# Patient Record
Sex: Female | Born: 1990 | Race: White | Hispanic: No | Marital: Single | Smoking: Never smoker
Health system: Southern US, Community
[De-identification: ages and names within clinical notes are randomized; demographics above are authoritative.]

## PROBLEM LIST (undated history)

## (undated) DIAGNOSIS — F32A Depression, unspecified: Secondary | ICD-10-CM

## (undated) DIAGNOSIS — K769 Liver disease, unspecified: Secondary | ICD-10-CM

## (undated) DIAGNOSIS — F329 Major depressive disorder, single episode, unspecified: Secondary | ICD-10-CM

## (undated) DIAGNOSIS — F419 Anxiety disorder, unspecified: Secondary | ICD-10-CM

## (undated) DIAGNOSIS — J039 Acute tonsillitis, unspecified: Secondary | ICD-10-CM

## (undated) DIAGNOSIS — N179 Acute kidney failure, unspecified: Secondary | ICD-10-CM

## (undated) DIAGNOSIS — A419 Sepsis, unspecified organism: Secondary | ICD-10-CM

## (undated) DIAGNOSIS — F319 Bipolar disorder, unspecified: Secondary | ICD-10-CM

## (undated) DIAGNOSIS — M797 Fibromyalgia: Secondary | ICD-10-CM

## (undated) HISTORY — PX: TONSILLECTOMY: SUR1361

## (undated) HISTORY — PX: WISDOM TOOTH EXTRACTION: SHX21

## (undated) HISTORY — DX: Acute tonsillitis, unspecified: J03.90

## (undated) HISTORY — DX: Sepsis, unspecified organism: A41.9

## (undated) HISTORY — DX: Acute kidney failure, unspecified: N17.9

## (undated) HISTORY — DX: Liver disease, unspecified: K76.9

## (undated) HISTORY — DX: Fibromyalgia: M79.7

---

## 2010-07-21 ENCOUNTER — Emergency Department (HOSPITAL_COMMUNITY)
Admission: EM | Admit: 2010-07-21 | Discharge: 2010-07-21 | Disposition: A | Discharge status: Home / Self Care | Payer: Medicaid Other | Attending: Emergency Medicine | Admitting: Emergency Medicine

## 2010-07-21 DIAGNOSIS — J029 Acute pharyngitis, unspecified: Secondary | ICD-10-CM | POA: Insufficient documentation

## 2010-07-21 DIAGNOSIS — R6883 Chills (without fever): Secondary | ICD-10-CM | POA: Insufficient documentation

## 2010-07-21 DIAGNOSIS — R599 Enlarged lymph nodes, unspecified: Secondary | ICD-10-CM | POA: Insufficient documentation

## 2010-07-21 DIAGNOSIS — R51 Headache: Secondary | ICD-10-CM | POA: Insufficient documentation

## 2010-12-20 ENCOUNTER — Emergency Department (HOSPITAL_COMMUNITY)
Admission: EM | Admit: 2010-12-20 | Discharge: 2010-12-20 | Disposition: A | Discharge status: Home / Self Care | Payer: Self-pay | Attending: Emergency Medicine | Admitting: Emergency Medicine

## 2010-12-20 ENCOUNTER — Encounter: Payer: Self-pay | Admitting: *Deleted

## 2010-12-20 DIAGNOSIS — N390 Urinary tract infection, site not specified: Secondary | ICD-10-CM | POA: Insufficient documentation

## 2010-12-20 DIAGNOSIS — R350 Frequency of micturition: Secondary | ICD-10-CM | POA: Insufficient documentation

## 2010-12-20 DIAGNOSIS — R109 Unspecified abdominal pain: Secondary | ICD-10-CM | POA: Insufficient documentation

## 2010-12-20 DIAGNOSIS — M545 Low back pain, unspecified: Secondary | ICD-10-CM | POA: Insufficient documentation

## 2010-12-20 DIAGNOSIS — R3915 Urgency of urination: Secondary | ICD-10-CM | POA: Insufficient documentation

## 2010-12-20 LAB — WET PREP, GENITAL
Clue Cells Wet Prep HPF POC: NONE SEEN
Trich, Wet Prep: NONE SEEN

## 2010-12-20 LAB — URINALYSIS, ROUTINE W REFLEX MICROSCOPIC
Bilirubin Urine: NEGATIVE
Glucose, UA: NEGATIVE mg/dL
Ketones, ur: NEGATIVE mg/dL
Nitrite: NEGATIVE
Specific Gravity, Urine: 1.023 (ref 1.005–1.030)
pH: 7.5 (ref 5.0–8.0)

## 2010-12-20 LAB — URINE MICROSCOPIC-ADD ON

## 2010-12-20 LAB — PREGNANCY, URINE: Preg Test, Ur: NEGATIVE

## 2010-12-20 MED ORDER — CIPROFLOXACIN HCL 500 MG PO TABS: 500.0000 mg | ORAL_TABLET | Freq: Two times a day (BID) | ORAL | Status: AC

## 2010-12-20 NOTE — ED Provider Notes (Signed)
History     CSN: [id number] Arrival date & time: 12/20/2010  2:08 PM   First MD Initiated Contact with Patient 12/20/10 1444   HPI Patient reports right-sided lower back and suprapubic abdominal pain. States she thinks it is a kidney infection. Associated with urinary frequency and urgency. Denies dysuria, fever, nausea, vomiting, diarrhea, constipation. Patient is a 20 y.o. female presenting with frequency. The history is provided by the patient.  Urinary Frequency This is a new problem. The current episode started yesterday. The problem occurs constantly. The problem has been gradually worsening. Associated symptoms include abdominal pain and urinary symptoms. Pertinent negatives include no chills, fever, nausea or vomiting. Associated symptoms comments: Right-sided back pain, vaginal discharge.. Exacerbated by: Urination. She has tried nothing for the symptoms.    History reviewed. No pertinent past medical history.  History reviewed. No pertinent past surgical history.  History reviewed. No pertinent family history.  History  Substance Use Topics  . Smoking status: Not on file  . Smokeless tobacco: Not on file  . Alcohol Use: Not on file    OB History    Grav Para Term Preterm Abortions TAB SAB Ect Mult Living                  Review of Systems  Constitutional: Negative for fever and chills.  Gastrointestinal: Positive for abdominal pain. Negative for nausea, vomiting, diarrhea and constipation.  Genitourinary: Positive for urgency and frequency. Negative for dysuria, hematuria and flank pain.  Musculoskeletal: Positive for back pain.    Allergies  Review of patient's allergies indicates no known allergies.  Home Medications  No current outpatient prescriptions on file.  BP 116/80  Pulse 95  Temp(Src) 98.3 F (36.8 C) (Oral)  Resp 20  SpO2 98%  LMP 12/01/2010  Physical Exam  Vitals reviewed. Constitutional: She is oriented to person, place, and time. Vital  signs are normal. She appears well-developed and well-nourished.  HENT:  Head: Normocephalic and atraumatic.  Eyes: Conjunctivae are normal. Pupils are equal, round, and reactive to light.  Neck: Normal range of motion. Neck supple.  Cardiovascular: Normal rate, regular rhythm and normal heart sounds.  Exam reveals no friction rub.   No murmur heard. Pulmonary/Chest: Effort normal and breath sounds normal. She has no wheezes. She has no rhonchi. She has no rales. She exhibits no tenderness.  Abdominal: Soft. Bowel sounds are normal. She exhibits no distension and no mass. There is no tenderness. There is CVA tenderness. There is no rebound and no guarding.       Right-sided CVA tenderness.  Musculoskeletal: Normal range of motion.  Neurological: She is alert and oriented to person, place, and time. Coordination normal.  Skin: Skin is warm and dry. No rash noted. No erythema. No pallor.    ED Course  Procedures  Results for orders placed during the hospital encounter of 12/20/10  URINALYSIS, ROUTINE W REFLEX MICROSCOPIC      Component Value Range   Color, Urine YELLOW  YELLOW    Appearance CLOUDY (*) CLEAR    Specific Gravity, Urine 1.023  1.005 - 1.030    pH 7.5  5.0 - 8.0    Glucose, UA NEGATIVE  NEGATIVE (mg/dL)   Hgb urine dipstick NEGATIVE  NEGATIVE    Bilirubin Urine NEGATIVE  NEGATIVE    Ketones, ur NEGATIVE  NEGATIVE (mg/dL)   Protein, ur NEGATIVE  NEGATIVE (mg/dL)   Urobilinogen, UA 1.0  0.0 - 1.0 (mg/dL)   Nitrite NEGATIVE  NEGATIVE  Leukocytes, UA MODERATE (*) NEGATIVE   WET PREP, GENITAL      Component Value Range   Yeast, Wet Prep NONE SEEN  NONE SEEN    Trich, Wet Prep NONE SEEN  NONE SEEN    Clue Cells, Wet Prep NONE SEEN  NONE SEEN    WBC, Wet Prep HPF POC MANY (*) NONE SEEN   PREGNANCY, URINE      Component Value Range   Preg Test, Ur NEGATIVE    URINE MICROSCOPIC-ADD ON      Component Value Range   Squamous Epithelial / LPF RARE  RARE    WBC, UA 7-10  <3  (WBC/hpf)   RBC / HPF 0-2  <3 (RBC/hpf)   Bacteria, UA FEW (*) RARE     MDM   We'll treat with Cipro x3 days for urinary tract infection.      Thomasene Lot, [place] 12/20/10 [phone]

## 2010-12-20 NOTE — ED Provider Notes (Signed)
Medical screening examination/treatment/procedure(s) were performed by non-physician practitioner and as supervising physician I was immediately available for consultation/collaboration.   Asenath Balash M Azeez Dunker, DO 12/20/10 2205 

## 2010-12-22 LAB — GC/CHLAMYDIA PROBE AMP, GENITAL: Chlamydia, DNA Probe: NEGATIVE

## 2011-02-21 ENCOUNTER — Encounter (HOSPITAL_COMMUNITY): Payer: Self-pay | Admitting: *Deleted

## 2011-02-21 ENCOUNTER — Emergency Department (HOSPITAL_COMMUNITY)
Admission: EM | Admit: 2011-02-21 | Discharge: 2011-02-21 | Disposition: A | Discharge status: Home / Self Care | Payer: Medicaid Other | Attending: Emergency Medicine | Admitting: Emergency Medicine

## 2011-02-21 DIAGNOSIS — R21 Rash and other nonspecific skin eruption: Secondary | ICD-10-CM | POA: Insufficient documentation

## 2011-02-21 DIAGNOSIS — F411 Generalized anxiety disorder: Secondary | ICD-10-CM | POA: Insufficient documentation

## 2011-02-21 DIAGNOSIS — F419 Anxiety disorder, unspecified: Secondary | ICD-10-CM

## 2011-02-21 DIAGNOSIS — M79609 Pain in unspecified limb: Secondary | ICD-10-CM | POA: Insufficient documentation

## 2011-02-21 MED ORDER — LORAZEPAM 1 MG PO TABS: 1.0000 mg | ORAL_TABLET | Freq: Two times a day (BID) | ORAL | Status: AC | PRN

## 2011-02-21 NOTE — ED Notes (Signed)
Patient with abcess under her arms. Has been there for two years and getting worse

## 2011-02-21 NOTE — ED Provider Notes (Signed)
History     CSN: [id number]  Arrival date & time 02/21/11  1517   First MD Initiated Contact with Patient 02/21/11 1615      Chief Complaint  Patient presents with  . boils      Patient is a 21 y.o. female presenting with rash. The history is provided by the patient.  Rash  This is a chronic problem. The current episode started more than 1 week ago. The problem has not changed since onset.The problem is associated with an unknown factor. There has been no fever. The pain is at a severity of 3/10. The pain is mild. Associated symptoms include pain. She has tried antibiotic cream for the symptoms.  Pt reports 2 yr hx of "abscesses" to her bil axilla. States this started to occur when she was pregnant approx 2 yrs ago abd has persisted. Pt states she get thesesmall red pustules that often develop into painful abscesses. States she has tried abx on numerous occassions w/o improvement. Pt requesting referral to dermatologist.  Pt also states she has a hx of anxiety/panic disorder and is out of her medication. States she has attempted to get in to see her PCP but the assigned PCP on her medicaid card "is not accepting new pt's". Requesting short course of medication for anxiety as she is under a tremendous amount of personal stress and needs something.    History reviewed. No pertinent past medical history.  History reviewed. No pertinent past surgical history.  History reviewed. No pertinent family history.  History  Substance Use Topics  . Smoking status: Never Smoker   . Smokeless tobacco: Not on file  . Alcohol Use: No    OB History    Grav Para Term Preterm Abortions TAB SAB Ect Mult Living                  Review of Systems  Constitutional: Negative.   HENT: Negative.   Eyes: Negative.   Respiratory: Negative.   Cardiovascular: Negative.   Gastrointestinal: Negative.   Genitourinary: Negative.   Musculoskeletal: Negative.   Skin: Positive for rash.  Neurological:  Negative.   Hematological: Negative.   Psychiatric/Behavioral: Negative.     Allergies  Review of patient's allergies indicates no known allergies.  Home Medications   Current Outpatient Rx  Name Route Sig Dispense Refill  . IBUPROFEN 200 MG PO TABS Oral Take 600 mg by mouth daily as needed. For pain/headache      BP 121/87  Pulse 97  Temp(Src) 98.6 F (37 C) (Oral)  Resp 20  SpO2 98%  Physical Exam  Constitutional: She is oriented to person, place, and time. She appears well-developed and well-nourished.  HENT:  Head: Normocephalic and atraumatic.  Eyes: Conjunctivae are normal.  Neck: Neck supple.  Cardiovascular: Normal rate.   Pulmonary/Chest: Effort normal and breath sounds normal.  Musculoskeletal: Normal range of motion.  Neurological: She is alert and oriented to person, place, and time.  Skin: Skin is warm and dry. No erythema.       Scattered small, raised, erythematous, pustules to bil axilla. No palpable nodules, erythema or indurated areas around these pustules. None w/ palpable fluctuance. Pt reports mild TTP.  Resolving abscesses to (L) upper buttocks and (R) lower axilla.   Psychiatric: She has a normal mood and affect.    ED Course  Procedures Findings and clinical impression discussed w/ pt. Will plan to d/c home w/ dermatology/surgery referrals and prescribe short course of medication for anxiety  and strongly encourage pt's continued efforts at getting established w/a primary care provider. Pt is agreeable w/ plan.  Labs Reviewed - No data to display No results found.   No diagnosis found.    MDM   HPI/PE and clinical findings c/w hidradenitis suppurative (No infectious appearing abscesses at this time). Anxiety by self reported hx.       Leanne Chang, NP 02/21/11 1727

## 2011-02-22 NOTE — ED Provider Notes (Signed)
Medical screening examination/treatment/procedure(s) were performed by non-physician practitioner and as supervising physician I was immediately available for consultation/collaboration.   Suzi Roots, MD 02/22/11 1029

## 2011-05-16 ENCOUNTER — Emergency Department (HOSPITAL_COMMUNITY)
Admission: EM | Admit: 2011-05-16 | Discharge: 2011-05-16 | Disposition: A | Discharge status: Home / Self Care | Payer: Self-pay | Source: Home / Self Care | Attending: Family Medicine | Admitting: Family Medicine

## 2011-05-16 ENCOUNTER — Encounter (HOSPITAL_COMMUNITY): Payer: Self-pay | Admitting: Emergency Medicine

## 2011-05-16 DIAGNOSIS — J020 Streptococcal pharyngitis: Secondary | ICD-10-CM

## 2011-05-16 HISTORY — DX: Anxiety disorder, unspecified: F41.9

## 2011-05-16 HISTORY — DX: Depression, unspecified: F32.A

## 2011-05-16 HISTORY — DX: Major depressive disorder, single episode, unspecified: F32.9

## 2011-05-16 MED ORDER — CEFDINIR 300 MG PO CAPS: 300.0000 mg | ORAL_CAPSULE | Freq: Two times a day (BID) | ORAL | Status: AC

## 2011-05-16 NOTE — Discharge Instructions (Signed)
Drink lots of fluids, take all of medicine, use lozenges as needed.return if needed °

## 2011-05-16 NOTE — ED Notes (Signed)
Sore throat and ears pain, onset to days ago.  Son treated for strep throat recently.

## 2011-05-16 NOTE — ED Provider Notes (Signed)
History     CSN: [id number]  Arrival date & time 05/16/11  1309   First MD Initiated Contact with Patient 05/16/11 1311      Chief Complaint  Patient presents with  . Sore Throat    (Consider location/radiation/quality/duration/timing/severity/associated sxs/prior treatment) Patient is a 21 y.o. female presenting with pharyngitis. The history is provided by the patient.  Sore Throat This is a new problem. The current episode started 2 days ago. The problem occurs constantly. The problem has been gradually worsening. Associated symptoms comments: Son with recent strep throat. The symptoms are aggravated by swallowing.    Past Medical History  Diagnosis Date  . Depression   . Anxiety     History reviewed. No pertinent past surgical history.  No family history on file.  History  Substance Use Topics  . Smoking status: Never Smoker   . Smokeless tobacco: Not on file  . Alcohol Use: No    OB History    Grav Para Term Preterm Abortions TAB SAB Ect Mult Living                  Review of Systems  Constitutional: Negative.   HENT: Positive for ear pain and sore throat. Negative for congestion, rhinorrhea, sneezing and postnasal drip.   Respiratory: Negative for cough.   Gastrointestinal: Negative.   Hematological: Positive for adenopathy.    Allergies  Review of patient's allergies indicates no known allergies.  Home Medications   Current Outpatient Rx  Name Route Sig Dispense Refill  . DESVENLAFAXINE SUCCINATE ER 50 MG PO TB24 Oral Take 50 mg by mouth daily.    Marland Kitchen DIAZEPAM 5 MG PO TABS Oral Take 5 mg by mouth every 6 (six) hours as needed.    Marland Kitchen CEFDINIR 300 MG PO CAPS Oral Take 1 capsule (300 mg total) by mouth 2 (two) times daily. 20 capsule 0  . IBUPROFEN 200 MG PO TABS Oral Take 600 mg by mouth daily as needed. For pain/headache      BP 127/76  Pulse 100  Temp(Src) 98.8 F (37.1 C) (Oral)  Resp 18  SpO2 100%  LMP 05/16/2011  Physical Exam  Nursing  note and vitals reviewed. Constitutional: She is oriented to person, place, and time. She appears well-developed and well-nourished.  HENT:  Head: Normocephalic.  Right Ear: External ear normal.  Left Ear: External ear normal.  Mouth/Throat: Posterior oropharyngeal erythema present. No oropharyngeal exudate or posterior oropharyngeal edema.  Eyes: Pupils are equal, round, and reactive to light.  Neck: Normal range of motion. Neck supple.  Cardiovascular: Normal rate, regular rhythm, normal heart sounds and intact distal pulses.   Pulmonary/Chest: Breath sounds normal.  Lymphadenopathy:    She has cervical adenopathy.  Neurological: She is alert and oriented to person, place, and time.  Skin: Skin is warm and dry.    ED Course  Procedures (including critical care time)  Labs Reviewed - No data to display No results found.   1. Strep sore throat       MDM          Linna Hoff, MD 05/16/11 1418

## 2011-12-21 ENCOUNTER — Emergency Department (HOSPITAL_COMMUNITY)
Admission: EM | Admit: 2011-12-21 | Discharge: 2011-12-21 | Disposition: A | Discharge status: Home / Self Care | Payer: Medicaid Other | Attending: Emergency Medicine | Admitting: Emergency Medicine

## 2011-12-21 ENCOUNTER — Encounter (HOSPITAL_COMMUNITY): Payer: Self-pay | Admitting: Emergency Medicine

## 2011-12-21 DIAGNOSIS — F329 Major depressive disorder, single episode, unspecified: Secondary | ICD-10-CM | POA: Insufficient documentation

## 2011-12-21 DIAGNOSIS — L259 Unspecified contact dermatitis, unspecified cause: Secondary | ICD-10-CM

## 2011-12-21 DIAGNOSIS — F319 Bipolar disorder, unspecified: Secondary | ICD-10-CM | POA: Insufficient documentation

## 2011-12-21 DIAGNOSIS — F411 Generalized anxiety disorder: Secondary | ICD-10-CM | POA: Insufficient documentation

## 2011-12-21 DIAGNOSIS — Z79899 Other long term (current) drug therapy: Secondary | ICD-10-CM | POA: Insufficient documentation

## 2011-12-21 DIAGNOSIS — F3289 Other specified depressive episodes: Secondary | ICD-10-CM | POA: Insufficient documentation

## 2011-12-21 HISTORY — DX: Bipolar disorder, unspecified: F31.9

## 2011-12-21 MED ORDER — HYDROXYZINE PAMOATE 100 MG PO CAPS: 100.0000 mg | ORAL_CAPSULE | Freq: Three times a day (TID) | ORAL | Status: DC | PRN

## 2011-12-21 MED ORDER — PREDNISONE 20 MG PO TABS: ORAL_TABLET | ORAL | Status: DC

## 2011-12-21 NOTE — ED Provider Notes (Signed)
History     CSN: [id number]  Arrival date & time 12/21/11  1148   First MD Initiated Contact with Patient 12/21/11 1154      No chief complaint on file.   (Consider location/radiation/quality/duration/timing/severity/associated sxs/prior treatment) HPI Comments: 21 year old female presents the emergency department complaining of a rash on her face for the past 3 days. She states she believes it is coming from her boyfriends detergent. She lays on her boyfriends chest on the left side of her face where the rash began. It is spread slightly to the right side of her face and beginning to go down her back. States it is very itchy. Denies difficulty breathing or swallowing. No chest pain or shortness of breath. Does not recall eating any different foods. She tried applying a cream she has for rashes without relief. Benadryl is not providing much relief.  The history is provided by the patient.    Past Medical History  Diagnosis Date  . Depression   . Anxiety   . Bipolar 1 disorder     No past surgical history on file.  No family history on file.  History  Substance Use Topics  . Smoking status: Never Smoker   . Smokeless tobacco: Not on file  . Alcohol Use: No    OB History    Grav Para Term Preterm Abortions TAB SAB Ect Mult Living                  Review of Systems  Constitutional: Negative for fever and chills.  HENT: Negative for trouble swallowing, neck pain and neck stiffness.   Eyes: Negative.   Respiratory: Negative for chest tightness and shortness of breath.   Musculoskeletal: Negative.   Skin: Positive for rash.    Allergies  Review of patient's allergies indicates no known allergies.  Home Medications   Current Outpatient Rx  Name  Route  Sig  Dispense  Refill  . ALPRAZOLAM 0.5 MG PO TABS   Oral   Take 0.5 mg by mouth 3 (three) times daily as needed. For anxiety         . DIPHENHYDRAMINE HCL 25 MG PO CAPS   Oral   Take 25 mg by mouth every 6  (six) hours as needed. For allergies         . DOXYCYCLINE HYCLATE 100 MG PO TABS   Oral   Take 100 mg by mouth daily.         Marland Kitchen FLUOXETINE HCL 20 MG PO TABS   Oral   Take 20 mg by mouth daily.         . IBUPROFEN 200 MG PO TABS   Oral   Take 600 mg by mouth daily as needed. For pain/headache         . LAMOTRIGINE 100 MG PO TABS   Oral   Take 100 mg by mouth daily.           BP 131/81  Pulse 99  Temp 98.5 F (36.9 C) (Oral)  Resp 18  SpO2 98%  Physical Exam  Constitutional: She is oriented to person, place, and time. She appears well-developed and well-nourished. No distress.  HENT:  Head: Normocephalic and atraumatic.  Mouth/Throat: Oropharynx is clear and moist.  Eyes: Conjunctivae normal and EOM are normal. Pupils are equal, round, and reactive to light.  Neck: Normal range of motion. Neck supple.  Cardiovascular: Normal rate, regular rhythm and normal heart sounds.   Pulmonary/Chest: Effort normal and breath sounds  normal. No respiratory distress.  Musculoskeletal: Normal range of motion.  Lymphadenopathy:    She has no cervical adenopathy.  Neurological: She is alert and oriented to person, place, and time.  Skin: Skin is warm, dry and intact. Rash noted. Rash is maculopapular (on forehead, cheeks, nose, and scattered on upper back. no evidence of secondary infection. ). She is not diaphoretic.  Psychiatric: She has a normal mood and affect. Her behavior is normal.    ED Course  Procedures (including critical care time)  Labs Reviewed - No data to display No results found.   1. Contact dermatitis       MDM  21 y/o female with contact dermatitis. No evidence of secondary infection. Rx prednisone and vistaril. Return precautions discussed.        Trevor Mace, PA-C 12/21/11 1527

## 2011-12-21 NOTE — ED Notes (Signed)
Is on a med for folliculitiis c 8 months  doxy

## 2011-12-21 NOTE — ED Notes (Signed)
3rd day of breaking out in rash > ? Boyfriends detergent  Rash on fash  Back no diff breathing or swollowing pt drover herself here

## 2011-12-23 NOTE — ED Provider Notes (Signed)
Medical screening examination/treatment/procedure(s) were conducted as a shared visit with non-physician practitioner(s) and myself.  I personally evaluated the patient during the encounter.  21yF with diffuse scaly rash consistent with eczema. Hx of same. Plan topical and also oral steroids given extent of involvement. Return precautions discussed.  Raeford Razor, MD 12/23/11 [phone]

## 2012-06-21 ENCOUNTER — Encounter (HOSPITAL_COMMUNITY): Payer: Self-pay | Admitting: *Deleted

## 2012-06-21 ENCOUNTER — Emergency Department (HOSPITAL_COMMUNITY)
Admission: EM | Admit: 2012-06-21 | Discharge: 2012-06-21 | Disposition: A | Discharge status: Home / Self Care | Payer: Medicaid Other | Attending: Emergency Medicine | Admitting: Emergency Medicine

## 2012-06-21 ENCOUNTER — Emergency Department (HOSPITAL_COMMUNITY): Payer: Medicaid Other

## 2012-06-21 DIAGNOSIS — Z3202 Encounter for pregnancy test, result negative: Secondary | ICD-10-CM | POA: Insufficient documentation

## 2012-06-21 DIAGNOSIS — R1031 Right lower quadrant pain: Secondary | ICD-10-CM | POA: Insufficient documentation

## 2012-06-21 DIAGNOSIS — Z79899 Other long term (current) drug therapy: Secondary | ICD-10-CM | POA: Insufficient documentation

## 2012-06-21 DIAGNOSIS — F319 Bipolar disorder, unspecified: Secondary | ICD-10-CM | POA: Insufficient documentation

## 2012-06-21 DIAGNOSIS — M545 Low back pain, unspecified: Secondary | ICD-10-CM | POA: Insufficient documentation

## 2012-06-21 DIAGNOSIS — K219 Gastro-esophageal reflux disease without esophagitis: Secondary | ICD-10-CM | POA: Insufficient documentation

## 2012-06-21 DIAGNOSIS — F411 Generalized anxiety disorder: Secondary | ICD-10-CM | POA: Insufficient documentation

## 2012-06-21 DIAGNOSIS — R109 Unspecified abdominal pain: Secondary | ICD-10-CM

## 2012-06-21 DIAGNOSIS — R6883 Chills (without fever): Secondary | ICD-10-CM | POA: Insufficient documentation

## 2012-06-21 LAB — POCT I-STAT, CHEM 8
Calcium, Ion: 1.24 mmol/L — ABNORMAL HIGH (ref 1.12–1.23)
Creatinine, Ser: 0.6 mg/dL (ref 0.50–1.10)
Glucose, Bld: 94 mg/dL (ref 70–99)
Hemoglobin: 13.6 g/dL (ref 12.0–15.0)
Potassium: 3.5 mEq/L (ref 3.5–5.1)

## 2012-06-21 LAB — URINALYSIS, ROUTINE W REFLEX MICROSCOPIC
Bilirubin Urine: NEGATIVE
Ketones, ur: NEGATIVE mg/dL
Nitrite: NEGATIVE
Protein, ur: NEGATIVE mg/dL
Urobilinogen, UA: 0.2 mg/dL (ref 0.0–1.0)
pH: 6.5 (ref 5.0–8.0)

## 2012-06-21 LAB — COMPREHENSIVE METABOLIC PANEL
ALT: 19 U/L (ref 0–35)
Alkaline Phosphatase: 59 U/L (ref 39–117)
BUN: 9 mg/dL (ref 6–23)
CO2: 25 mEq/L (ref 19–32)
Chloride: 102 mEq/L (ref 96–112)
GFR calc Af Amer: >90 mL/min (ref >90)
GFR calc non Af Amer: >90 mL/min (ref >90)
Glucose, Bld: 98 mg/dL (ref 70–99)
Potassium: 3.6 mEq/L (ref 3.5–5.1)
Sodium: 139 mEq/L (ref 135–145)
Total Bilirubin: 0.2 mg/dL — ABNORMAL LOW (ref 0.3–1.2)

## 2012-06-21 LAB — CBC
HCT: 39.2 % (ref 36.0–46.0)
Hemoglobin: 13.1 g/dL (ref 12.0–15.0)
MCHC: 33.4 g/dL (ref 30.0–36.0)
RBC: 4.81 MIL/uL (ref 3.87–5.11)

## 2012-06-21 LAB — URINE MICROSCOPIC-ADD ON

## 2012-06-21 MED ORDER — SODIUM CHLORIDE 0.9 % IV SOLN
INTRAVENOUS | Status: DC
  Administered 2012-06-21: 06:00:00 via INTRAVENOUS

## 2012-06-21 MED ORDER — IOHEXOL 300 MG/ML  SOLN
25.0000 mL | INTRAMUSCULAR | Status: AC
  Administered 2012-06-21: 25 mL via ORAL

## 2012-06-21 MED ORDER — IOHEXOL 300 MG/ML  SOLN
80.0000 mL | Freq: Once | INTRAMUSCULAR | Status: AC | PRN
  Administered 2012-06-21: 100 mL via INTRAVENOUS

## 2012-06-21 MED ORDER — HYDROCODONE-ACETAMINOPHEN 5-325 MG PO TABS: 2.0000 | ORAL_TABLET | ORAL | Status: DC | PRN

## 2012-06-21 NOTE — ED Notes (Signed)
Pt c/o RLQ pain, onset yesterday, also mentions chills and acid reflux, (denies: dizziness, nvd, fever, bleeding, urinary sx, rectal sx or vaginal sx), also mentions chronic low back pain and fall last week, back pain worse in last week. Last ate last night. Last BM yesterday (normal).

## 2012-06-21 NOTE — ED Provider Notes (Signed)
Care assumed from Dr. Dierdre Highman at shift change awaiting ct to rule out appendicitis.  This was negative for appy and did not reveal any alternate pathology.  She appears quite comfortable.  He abdomen was re-examined and remains benign.  I believe she is stable for discharge.  Will prescribe a few pain pills to take, reasons for return given.    Geoffery Lyons, MD 06/21/12 [phone]

## 2012-06-21 NOTE — ED Provider Notes (Signed)
History     CSN: [id number]  Arrival date & time 06/21/12  0434   First MD Initiated Contact with Patient 06/21/12 0444      Chief Complaint  Patient presents with  . Abdominal Pain  . Back Pain  . Chills  . Gastrophageal Reflux    (Consider location/radiation/quality/duration/timing/severity/associated sxs/prior treatment) HPI Hx per PT - RLQ ABD pain onset yesterday worsening, constant, PT concerned she may have appendicitis, no F/C, no N/V, no trauma, no rash, no recent travel, no diarrhea, constipation or recent blood in stools. Pain MOD in severity, not radiating, dull in quality  Past Medical History  Diagnosis Date  . Depression   . Anxiety   . Bipolar 1 disorder     Past Surgical History  Procedure Laterality Date  . Wisdom tooth extraction      History reviewed. No pertinent family history.  History  Substance Use Topics  . Smoking status: Never Smoker   . Smokeless tobacco: Not on file  . Alcohol Use: No    OB History   Grav Para Term Preterm Abortions TAB SAB Ect Mult Living                  Review of Systems  Constitutional: Negative for fever and chills.  HENT: Negative for neck pain and neck stiffness.   Eyes: Negative for pain.  Respiratory: Negative for shortness of breath.   Cardiovascular: Negative for chest pain.  Gastrointestinal: Positive for abdominal pain.  Genitourinary: Negative for dysuria.  Musculoskeletal: Negative for back pain.  Skin: Negative for rash.  Neurological: Negative for headaches.  All other systems reviewed and are negative.    Allergies  Review of patient's allergies indicates no known allergies.  Home Medications   Current Outpatient Rx  Name  Route  Sig  Dispense  Refill  . ALPRAZolam (XANAX) 0.5 MG tablet   Oral   Take 0.5 mg by mouth 3 (three) times daily as needed. For anxiety         . doxycycline (VIBRA-TABS) 100 MG tablet   Oral   Take 100 mg by mouth every other day.          .  fesoterodine (TOVIAZ) 4 MG TB24   Oral   Take 4 mg by mouth daily.         Marland Kitchen FLUoxetine (PROZAC) 40 MG capsule   Oral   Take 40 mg by mouth daily.         Marland Kitchen lamoTRIgine (LAMICTAL) 150 MG tablet   Oral   Take 150 mg by mouth daily.           BP 116/69  Pulse 98  Temp(Src) 98.6 F (37 C) (Oral)  Resp 18  Ht 5\' 6"  (1.676 m)  Wt 260 lb (117.935 kg)  BMI 41.99 kg/m2  SpO2 99%  LMP 06/05/2012  Physical Exam  Constitutional: She is oriented to person, place, and time. She appears well-developed and well-nourished.  HENT:  Head: Normocephalic and atraumatic.  Eyes: EOM are normal. Pupils are equal, round, and reactive to light.  Neck: Neck supple.  Cardiovascular: Normal rate, regular rhythm and intact distal pulses.   Pulmonary/Chest: Effort normal and breath sounds normal. No respiratory distress.  Abdominal: Soft. Bowel sounds are normal. She exhibits no distension. There is no rebound and no guarding.  TTP RLQ, no tenderness otherwise and neg Murphys sign  Musculoskeletal: Normal range of motion. She exhibits no edema.  Neurological: She is alert and oriented  to person, place, and time.  Skin: Skin is warm and dry.    ED Course  Procedures (including critical care time)  Results for orders placed during the hospital encounter of 06/21/12  CBC      Result Value Range   WBC 10.9 (*) 4.0 - 10.5 K/uL   RBC 4.81  3.87 - 5.11 MIL/uL   Hemoglobin 13.1  12.0 - 15.0 g/dL   HCT 16.1  09.6 - 04.5 %   MCV 81.5  78.0 - 100.0 fL   MCH 27.2  26.0 - 34.0 pg   MCHC 33.4  30.0 - 36.0 g/dL   RDW 40.9  81.1 - 91.4 %   Platelets 379  150 - 400 K/uL  COMPREHENSIVE METABOLIC PANEL      Result Value Range   Sodium 139  135 - 145 mEq/L   Potassium 3.6  3.5 - 5.1 mEq/L   Chloride 102  96 - 112 mEq/L   CO2 25  19 - 32 mEq/L   Glucose, Bld 98  70 - 99 mg/dL   BUN 9  6 - 23 mg/dL   Creatinine, Ser 7.82  0.50 - 1.10 mg/dL   Calcium 9.9  8.4 - 95.6 mg/dL   Total Protein 7.5  6.0 -  8.3 g/dL   Albumin 3.5  3.5 - 5.2 g/dL   AST 16  0 - 37 U/L   ALT 19  0 - 35 U/L   Alkaline Phosphatase 59  39 - 117 U/L   Total Bilirubin 0.2 (*) 0.3 - 1.2 mg/dL   GFR calc non Af Amer >90  >90 mL/min   GFR calc Af Amer >90  >90 mL/min  URINALYSIS, ROUTINE W REFLEX MICROSCOPIC      Result Value Range   Color, Urine YELLOW  YELLOW   APPearance CLEAR  CLEAR   Specific Gravity, Urine 1.023  1.005 - 1.030   pH 6.5  5.0 - 8.0   Glucose, UA NEGATIVE  NEGATIVE mg/dL   Hgb urine dipstick NEGATIVE  NEGATIVE   Bilirubin Urine NEGATIVE  NEGATIVE   Ketones, ur NEGATIVE  NEGATIVE mg/dL   Protein, ur NEGATIVE  NEGATIVE mg/dL   Urobilinogen, UA 0.2  0.0 - 1.0 mg/dL   Nitrite NEGATIVE  NEGATIVE   Leukocytes, UA TRACE (*) NEGATIVE  PREGNANCY, URINE      Result Value Range   Preg Test, Ur NEGATIVE  NEGATIVE  URINE MICROSCOPIC-ADD ON      Result Value Range   Squamous Epithelial / LPF FEW (*) RARE   WBC, UA 3-6  <3 WBC/hpf   RBC / HPF 0-2  <3 RBC/hpf   Bacteria, UA FEW (*) RARE   Urine-Other MUCOUS PRESENT    POCT I-STAT, CHEM 8      Result Value Range   Sodium 140  135 - 145 mEq/L   Potassium 3.5  3.5 - 5.1 mEq/L   Chloride 103  96 - 112 mEq/L   BUN 8  6 - 23 mg/dL   Creatinine, Ser 2.13  0.50 - 1.10 mg/dL   Glucose, Bld 94  70 - 99 mg/dL   Calcium, Ion 0.86 (*) 1.12 - 1.23 mmol/L   TCO2 27  0 - 100 mmol/L   Hemoglobin 13.6  12.0 - 15.0 g/dL   HCT 57.8  46.9 - 62.9 %    IVFs, NPO  Declines any pain medications - offered multiple times  CT pending 7am Dr Judd Lien to follow  MDM  RLQ  ABD pain  Labs, IV, CT scan        Sunnie Nielsen, MD 06/21/12 [phone]

## 2012-06-21 NOTE — ED Notes (Signed)
Pt to CT

## 2012-06-21 NOTE — ED Notes (Signed)
Offered to speak to doctor regarding pain medication, but pt wants to know results of CT Scan before taking anything.

## 2012-06-22 LAB — URINE CULTURE

## 2012-10-17 ENCOUNTER — Other Ambulatory Visit: Payer: Self-pay | Admitting: Oral Surgery

## 2012-10-17 DIAGNOSIS — S0300XA Dislocation of jaw, unspecified side, initial encounter: Secondary | ICD-10-CM

## 2012-10-23 ENCOUNTER — Ambulatory Visit
Admission: RE | Admit: 2012-10-23 | Discharge: 2012-10-23 | Disposition: A | Discharge status: Home / Self Care | Payer: Medicaid Other | Source: Ambulatory Visit | Attending: Oral Surgery | Admitting: Oral Surgery

## 2012-10-23 DIAGNOSIS — S0300XA Dislocation of jaw, unspecified side, initial encounter: Secondary | ICD-10-CM

## 2012-11-15 ENCOUNTER — Encounter (HOSPITAL_COMMUNITY): Payer: Self-pay | Admitting: Emergency Medicine

## 2012-11-15 ENCOUNTER — Emergency Department (HOSPITAL_COMMUNITY)
Admission: EM | Admit: 2012-11-15 | Discharge: 2012-11-15 | Disposition: A | Discharge status: Home / Self Care | Payer: Medicaid Other | Source: Home / Self Care | Attending: Family Medicine | Admitting: Family Medicine

## 2012-11-15 DIAGNOSIS — J029 Acute pharyngitis, unspecified: Secondary | ICD-10-CM

## 2012-11-15 LAB — POCT PREGNANCY, URINE: Preg Test, Ur: NEGATIVE

## 2012-11-15 MED ORDER — AMOXICILLIN 500 MG PO CAPS: 500.0000 mg | ORAL_CAPSULE | Freq: Three times a day (TID) | ORAL | Status: DC

## 2012-11-15 NOTE — ED Provider Notes (Signed)
CSN: [id number]     Arrival date & time 11/15/12  2000 History   First MD Initiated Contact with Patient 11/15/12 2102     Chief Complaint  Patient presents with  . Sore Throat   (Consider location/radiation/quality/duration/timing/severity/associated sxs/prior Treatment) HPI Patient is a 22 yo F presenting with sore throat x1 day. She has some associated runny nose and ear pain. No fevers. 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 Denies cough, N/V, dysuria. She is also concerned that her period is late and that she may be pregnant. Has not taken a home pregnancy test.  Past Medical History  Diagnosis Date  . Depression   . Anxiety   . Bipolar 1 disorder    Past Surgical History  Procedure Laterality Date  . Wisdom tooth extraction     Family History  Problem Relation Age of Onset  . Hypertension Father   . Heart failure Father     hyptertensive cardiovascular disease   History  Substance Use Topics  . Smoking status: Never Smoker   . Smokeless tobacco: Not on file  . Alcohol Use: No   OB History   Grav Para Term Preterm Abortions TAB SAB Ect Mult Living                 Review of Systems  Constitutional: Negative for fever and chills.  HENT: Positive for sore throat and trouble swallowing. Negative for congestion, drooling and mouth sores.   Eyes: Negative for visual disturbance.  Respiratory: Negative for cough and shortness of breath.   Cardiovascular: Negative for chest pain and leg swelling.  Gastrointestinal: Negative for abdominal pain.  Genitourinary: Positive for menstrual problem. Negative for dysuria.  Musculoskeletal: Negative for arthralgias and myalgias.  Skin: Negative for rash.  Neurological: Negative for headaches.    Allergies  Review of patient's allergies indicates no known allergies.  Home Medications   Current Outpatient Rx  Name  Route  Sig  Dispense  Refill  . ALPRAZolam (XANAX)  0.5 MG tablet   Oral   Take 0.5 mg by mouth 3 (three) times daily as needed. For anxiety         . fesoterodine (TOVIAZ) 4 MG TB24   Oral   Take 4 mg by mouth daily.         Marland Kitchen FLUoxetine (PROZAC) 40 MG capsule   Oral   Take 40 mg by mouth daily.         Marland Kitchen lamoTRIgine (LAMICTAL) 150 MG tablet   Oral   Take 150 mg by mouth daily.         Marland Kitchen amoxicillin (AMOXIL) 500 MG capsule   Oral   Take 1 capsule (500 mg total) by mouth 3 (three) times daily.   30 capsule   0   . doxycycline (VIBRA-TABS) 100 MG tablet   Oral   Take 100 mg by mouth every other day.          Marland Kitchen HYDROcodone-acetaminophen (NORCO) 5-325 MG per tablet   Oral   Take 2 tablets by mouth every 4 (four) hours as needed for pain.   10 tablet   0    BP 130/84  Pulse 114  Temp(Src) 98.8 F (37.1 C) (Oral)  Resp 16  SpO2 100%  LMP 10/24/2012 Physical Exam  Constitutional: She appears well-developed and well-nourished. No distress.  HENT:  Head: Normocephalic and atraumatic.  Mouth/Throat: Oropharyngeal exudate (Diffuse exudate on tonsils R>L.  Very few petechiae on soft palate but diffuse erythema), posterior oropharyngeal edema and posterior oropharyngeal erythema present.  Eyes: Pupils are equal, round, and reactive to light.  Neck: Neck supple.  Cardiovascular: Normal rate and regular rhythm.   Pulmonary/Chest: Effort normal and breath sounds normal. She has no wheezes.  Abdominal: Soft. There is no tenderness.  Musculoskeletal: Normal range of motion.  Lymphadenopathy:    She has no cervical adenopathy.  Neurological: She is alert.  Skin: Skin is warm and dry.  Psychiatric: She has a normal mood and affect.    ED Course  Procedures (including critical care time) Labs Review Labs Reviewed  POCT PREGNANCY, URINE  POCT RAPID STREP A ([facility] CARE ONLY)   Imaging Review No results found.  MDM   1. Pharyngitis    Rapid strep negative, but based on her physical exam will treat as if  bacterial pharyngitis. - Amox 500mg  TID x10 days (UPreg negative) - Salt water gargles for comfort - Tylenol prn - Avoid close contact with children for 24 hours - Given instructions on good hygiene for disposing of toothbrushes, etc. - F/u if worsens. Given red flags such as difficulty breathing, swallowing or signs of drooling. She should seek immediate care. Patient agrees.    Hilarie Fredrickson, MD 11/15/12 2151

## 2012-11-15 NOTE — ED Notes (Signed)
C/o sore throat onset this morning.  She looked at it and saw white pus on her tonsils.  No chills but thinks she may have had a low grade today.  States her ears hurt a little bit.  Nose was running this morning. No cough.

## 2012-11-17 LAB — CULTURE, GROUP A STREP

## 2012-11-17 NOTE — ED Provider Notes (Signed)
Medical screening examination/treatment/procedure(s) were performed by resident physician or non-physician practitioner and as supervising physician I was immediately available for consultation/collaboration.   Barkley Bruns MD.   Linna Hoff, MD 11/17/12 [phone]

## 2013-01-26 HISTORY — PX: TEMPOROMANDIBULAR JOINT ARTHROPLASTY: SUR76

## 2013-02-04 ENCOUNTER — Emergency Department (HOSPITAL_COMMUNITY)
Admission: EM | Admit: 2013-02-04 | Discharge: 2013-02-05 | Disposition: A | Discharge status: Home / Self Care | Payer: Medicaid Other | Attending: Emergency Medicine | Admitting: Emergency Medicine

## 2013-02-04 ENCOUNTER — Encounter (HOSPITAL_COMMUNITY): Payer: Self-pay | Admitting: Emergency Medicine

## 2013-02-04 DIAGNOSIS — Z792 Long term (current) use of antibiotics: Secondary | ICD-10-CM | POA: Insufficient documentation

## 2013-02-04 DIAGNOSIS — Z79899 Other long term (current) drug therapy: Secondary | ICD-10-CM | POA: Insufficient documentation

## 2013-02-04 DIAGNOSIS — F319 Bipolar disorder, unspecified: Secondary | ICD-10-CM | POA: Insufficient documentation

## 2013-02-04 DIAGNOSIS — K625 Hemorrhage of anus and rectum: Secondary | ICD-10-CM

## 2013-02-04 DIAGNOSIS — F411 Generalized anxiety disorder: Secondary | ICD-10-CM | POA: Insufficient documentation

## 2013-02-04 DIAGNOSIS — K644 Residual hemorrhoidal skin tags: Secondary | ICD-10-CM

## 2013-02-04 LAB — COMPREHENSIVE METABOLIC PANEL
ALBUMIN: 3.8 g/dL (ref 3.5–5.2)
ALK PHOS: 79 U/L (ref 39–117)
ALT: 20 U/L (ref 0–35)
AST: 24 U/L (ref 0–37)
BUN: 10 mg/dL (ref 6–23)
CHLORIDE: 96 meq/L (ref 96–112)
CO2: 26 mEq/L (ref 19–32)
CREATININE: 0.71 mg/dL (ref 0.50–1.10)
Calcium: 9.6 mg/dL (ref 8.4–10.5)
GLUCOSE: 118 mg/dL — AB (ref 70–99)
Potassium: 3.8 mEq/L (ref 3.7–5.3)
Sodium: 137 mEq/L (ref 137–147)
Total Protein: 8.3 g/dL (ref 6.0–8.3)

## 2013-02-04 LAB — CBC
HEMATOCRIT: 40.2 % (ref 36.0–46.0)
HEMOGLOBIN: 14.3 g/dL (ref 12.0–15.0)
MCH: 29.1 pg (ref 26.0–34.0)
MCHC: 35.6 g/dL (ref 30.0–36.0)
MCV: 81.7 fL (ref 78.0–100.0)
Platelets: 421 10*3/uL — ABNORMAL HIGH (ref 150–400)
RBC: 4.92 MIL/uL (ref 3.87–5.11)
RDW: 14.2 % (ref 11.5–15.5)
WBC: 14.3 10*3/uL — AB (ref 4.0–10.5)

## 2013-02-04 NOTE — ED Notes (Signed)
Pt states she is bleeding dark red blood from her rectum starting around 1400 today.

## 2013-02-04 NOTE — ED Provider Notes (Signed)
CSN: [id number]     Arrival date & time 02/04/13  2004 History   First MD Initiated Contact with Patient 02/04/13 2339     Chief Complaint  Patient presents with  . Rectal Bleeding   HPI  History provided by the patient. Patient is a 23 year old female with history of bipolar disorder who presents with concerns for episode of rectal bleeding. Patient states that she was having a bowel movement around 2:00pm earlier today when she suddenly had a "large amounts" of rectal bleeding into the toilet bowl and on tissue paper. Bleeding stopped after the bowel movement. Patient denies having any associated rectal pain. She denies having any constipation or diarrhea. Denies any straining with bowel movement. Denies any similar symptoms previously. She states she does not expect her next menstrual period for the next 10 days. She denies any vaginal bleeding or discharge. No dysuria or hematuria. No other aggravating or alleviating factors. No other associated symptoms. No fever, chills or sweats. No abdominal pain. No flank pain.    Past Medical History  Diagnosis Date  . Depression   . Anxiety   . Bipolar 1 disorder    Past Surgical History  Procedure Laterality Date  . Wisdom tooth extraction     Family History  Problem Relation Age of Onset  . Hypertension Father   . Heart failure Father     hyptertensive cardiovascular disease   History  Substance Use Topics  . Smoking status: Never Smoker   . Smokeless tobacco: Not on file  . Alcohol Use: No   OB History   Grav Para Term Preterm Abortions TAB SAB Ect Mult Living                 Review of Systems  Constitutional: Negative for fever, chills and diaphoresis.  Gastrointestinal: Positive for blood in stool and anal bleeding. Negative for nausea, vomiting, abdominal pain, diarrhea, constipation and rectal pain.  All other systems reviewed and are negative.    Allergies  Review of patient's allergies indicates no known  allergies.  Home Medications   Current Outpatient Rx  Name  Route  Sig  Dispense  Refill  . ALPRAZolam (XANAX) 0.5 MG tablet   Oral   Take 0.5 mg by mouth 3 (three) times daily as needed. For anxiety         . amoxicillin (AMOXIL) 500 MG capsule   Oral   Take 1 capsule (500 mg total) by mouth 3 (three) times daily.   30 capsule   0   . doxycycline (VIBRA-TABS) 100 MG tablet   Oral   Take 100 mg by mouth every other day.          . fesoterodine (TOVIAZ) 4 MG TB24   Oral   Take 4 mg by mouth daily.         Marland Kitchen FLUoxetine (PROZAC) 40 MG capsule   Oral   Take 40 mg by mouth daily.         Marland Kitchen HYDROcodone-acetaminophen (NORCO) 5-325 MG per tablet   Oral   Take 2 tablets by mouth every 4 (four) hours as needed for pain.   10 tablet   0   . lamoTRIgine (LAMICTAL) 150 MG tablet   Oral   Take 150 mg by mouth daily.          BP 132/81  Pulse 111  Temp(Src) 97.9 F (36.6 C) (Oral)  Resp 18  Wt 282 lb 11.2 oz (128.232 kg)  SpO2 100%  Physical Exam  Nursing note and vitals reviewed. Constitutional: She is oriented to person, place, and time. She appears well-developed and well-nourished. No distress.  HENT:  Head: Normocephalic.  Cardiovascular: Normal rate and regular rhythm.   Pulmonary/Chest: Effort normal and breath sounds normal. No respiratory distress. She has no wheezes. She has no rales.  Abdominal: Soft. There is no tenderness. There is no rebound, no guarding and no CVA tenderness.  Obese.  Genitourinary:     Chaperone was present. There is one small external hemorrhoid. No significant thrombosis. Mild tenderness. No fissures present. No active bleeding.  Neurological: She is alert and oriented to person, place, and time.  Skin: Skin is warm and dry. No rash noted.  Psychiatric: She has a normal mood and affect. Her behavior is normal.    ED Course  Procedures   DIAGNOSTIC STUDIES: Oxygen Saturation is 100% on room air.    COORDINATION OF  CARE:  Nursing notes reviewed. Vital signs reviewed. Initial pt interview and examination performed.   11:54 PM-patient seen and evaluated. Patient does appear very anxious. Does not appear in any acute distress or significant discomfort. On rectal exam with chaperone the patient does have a small external hemorrhoid. No active bleeding. No other concerning findings.   Patient has normal H&H at this time. Initially she was tachycardic at triage but does appear very anxious. At rest heart rate is normal during exam. She has normal orthostatic vitals. At this time do not suspect any concerning bleeding or other emergent process. Will plan to provide GI referral for continued evaluation and treatment. Patient encouraged use stool softener and warm sits baths. She expressed understanding and agrees.     MDM   1. External hemorrhoid   2. Rectal bleeding        Angus Sellereter S Christiane Sistare, PA-C 02/05/13 [phone]

## 2013-02-05 LAB — TYPE AND SCREEN
ABO/RH(D): O POS
ANTIBODY SCREEN: NEGATIVE

## 2013-02-05 LAB — ABO/RH: ABO/RH(D): O POS

## 2013-02-05 MED ORDER — POLYETHYLENE GLYCOL 3350 17 GM/SCOOP PO POWD: 17.0000 g | Freq: Every day | ORAL | Status: DC

## 2013-02-05 NOTE — ED Provider Notes (Signed)
Medical screening examination/treatment/procedure(s) were performed by non-physician practitioner and as supervising physician I was immediately available for consultation/collaboration.    Kloie Whiting, MD 02/05/13 2245 

## 2013-02-05 NOTE — Discharge Instructions (Signed)
You were seen and evaluated for episode of rectal bleeding. At this time your provider's feel this is caused from a hemorrhoid. Use a stool softener to help keep stool soft. Use warm soaks to help.  Please followup with your primary care provider on Monday. If you have any continued or worsened symptoms of bleeding return to emergency department or followup with a GI specialist.    Hemorrhoids Hemorrhoids are swollen veins around the rectum or anus. There are two types of hemorrhoids:   Internal hemorrhoids. These occur in the veins just inside the rectum. They may poke through to the outside and become irritated and painful.  External hemorrhoids. These occur in the veins outside the anus and can be felt as a painful swelling or hard lump near the anus. CAUSES  Pregnancy.   Obesity.   Constipation or diarrhea.   Straining to have a bowel movement.   Sitting for long periods on the toilet.  Heavy lifting or other activity that caused you to strain.  Anal intercourse. SYMPTOMS   Pain.   Anal itching or irritation.   Rectal bleeding.   Fecal leakage.   Anal swelling.   One or more lumps around the anus.  DIAGNOSIS  Your caregiver may be able to diagnose hemorrhoids by visual examination. Other examinations or tests that may be performed include:   Examination of the rectal area with a gloved hand (digital rectal exam).   Examination of anal canal using a small tube (scope).   A blood test if you have lost a significant amount of blood.  A test to look inside the colon (sigmoidoscopy or colonoscopy). TREATMENT Most hemorrhoids can be treated at home. However, if symptoms do not seem to be getting better or if you have a lot of rectal bleeding, your caregiver may perform a procedure to help make the hemorrhoids get smaller or remove them completely. Possible treatments include:   Placing a rubber band at the base of the hemorrhoid to cut off the circulation  (rubber band ligation).   Injecting a chemical to shrink the hemorrhoid (sclerotherapy).   Using a tool to burn the hemorrhoid (infrared light therapy).   Surgically removing the hemorrhoid (hemorrhoidectomy).   Stapling the hemorrhoid to block blood flow to the tissue (hemorrhoid stapling).  HOME CARE INSTRUCTIONS   Eat foods with fiber, such as whole grains, beans, nuts, fruits, and vegetables. Ask your doctor about taking products with added fiber in them (fibersupplements).  Increase fluid intake. Drink enough water and fluids to keep your urine clear or pale yellow.   Exercise regularly.   Go to the bathroom when you have the urge to have a bowel movement. Do not wait.   Avoid straining to have bowel movements.   Keep the anal area dry and clean. Use wet toilet paper or moist towelettes after a bowel movement.   Medicated creams and suppositories may be used or applied as directed.   Only take over-the-counter or prescription medicines as directed by your caregiver.   Take warm sitz baths for 15 20 minutes, 3 4 times a day to ease pain and discomfort.   Place ice packs on the hemorrhoids if they are tender and swollen. Using ice packs between sitz baths may be helpful.   Put ice in a plastic bag.   Place a towel between your skin and the bag.   Leave the ice on for 15 20 minutes, 3 4 times a day.   Do not use a  donut-shaped pillow or sit on the toilet for long periods. This increases blood pooling and pain.  SEEK MEDICAL CARE IF:  You have increasing pain and swelling that is not controlled by treatment or medicine.  You have uncontrolled bleeding.  You have difficulty or you are unable to have a bowel movement.  You have pain or inflammation outside the area of the hemorrhoids. MAKE SURE YOU:  Understand these instructions.  Will watch your condition.  Will get help right away if you are not doing well or get worse. Document Released:  01/10/2000 Document Revised: 12/30/2011 Document Reviewed: 11/17/2011 [facility] Patient Information 2014 [place], [place].    Rectal Bleeding  Rectal bleeding is when blood comes out of the opening of the butt (anus). Rectal bleeding may show up as bright red blood or really dark poop (stool). The poop may look dark red, maroon, or black. Rectal bleeding is often a sign that something is wrong. This needs to be checked by a doctor.  HOME CARE  Eat a diet high in fiber. This will help keep your poop soft.  Limit activitiy.  Drink enough fluids to keep your pee (urine) clear or pale yellow.  Take a warm bath to soothe any pain.  Follow up with your doctor as told. GET HELP RIGHT AWAY IF:  You have more bleeding.  You have black or dark red poop.  You throw up (vomit) blood or it looks like coffee grounds.  You have belly (abdominal) pain or tenderness.  You have a fever.  You feel weak, sick to your stomach (nauseous), or you pass out (faint).  You have pain that is so bad you cannot poop (bowel movement). MAKE SURE YOU:  Understand these instructions.  Will watch your condition.  Will get help right away if you are not doing well or get worse. Document Released: 09/24/2010 Document Revised: 04/06/2011 Document Reviewed: 09/24/2010 [facility] Patient Information 2014 [place], [place].

## 2013-04-12 ENCOUNTER — Other Ambulatory Visit (HOSPITAL_COMMUNITY): Payer: Self-pay | Admitting: Family Medicine

## 2013-04-12 DIAGNOSIS — M549 Dorsalgia, unspecified: Secondary | ICD-10-CM

## 2013-04-12 DIAGNOSIS — M419 Scoliosis, unspecified: Secondary | ICD-10-CM

## 2013-04-26 ENCOUNTER — Ambulatory Visit (HOSPITAL_COMMUNITY)
Admission: RE | Admit: 2013-04-26 | Discharge: 2013-04-26 | Disposition: A | Discharge status: Home / Self Care | Payer: Medicaid Other | Source: Ambulatory Visit | Attending: Family Medicine | Admitting: Family Medicine

## 2013-04-26 DIAGNOSIS — M549 Dorsalgia, unspecified: Secondary | ICD-10-CM | POA: Insufficient documentation

## 2013-04-26 DIAGNOSIS — M4120 Other idiopathic scoliosis, site unspecified: Secondary | ICD-10-CM | POA: Insufficient documentation

## 2013-04-26 DIAGNOSIS — M419 Scoliosis, unspecified: Secondary | ICD-10-CM

## 2013-05-24 DIAGNOSIS — M47816 Spondylosis without myelopathy or radiculopathy, lumbar region: Secondary | ICD-10-CM | POA: Insufficient documentation

## 2014-02-06 ENCOUNTER — Encounter (HOSPITAL_COMMUNITY): Payer: Self-pay | Admitting: Emergency Medicine

## 2014-02-06 ENCOUNTER — Emergency Department (HOSPITAL_COMMUNITY)
Admission: EM | Admit: 2014-02-06 | Discharge: 2014-02-06 | Disposition: A | Discharge status: Home / Self Care | Payer: Medicaid Other | Attending: Emergency Medicine | Admitting: Emergency Medicine

## 2014-02-06 DIAGNOSIS — H9202 Otalgia, left ear: Secondary | ICD-10-CM | POA: Diagnosis present

## 2014-02-06 DIAGNOSIS — F319 Bipolar disorder, unspecified: Secondary | ICD-10-CM | POA: Diagnosis not present

## 2014-02-06 DIAGNOSIS — F419 Anxiety disorder, unspecified: Secondary | ICD-10-CM | POA: Insufficient documentation

## 2014-02-06 DIAGNOSIS — R000 Tachycardia, unspecified: Secondary | ICD-10-CM | POA: Diagnosis not present

## 2014-02-06 DIAGNOSIS — Z79899 Other long term (current) drug therapy: Secondary | ICD-10-CM | POA: Diagnosis not present

## 2014-02-06 NOTE — ED Notes (Signed)
Pt reports pain to L ear and chills/fever last night. Hx TMJ. Denies drainage.

## 2014-02-06 NOTE — ED Provider Notes (Signed)
CSN: [id number]     Arrival date & time 02/06/14  1939 History   First MD Initiated Contact with Patient 02/06/14 2005     This chart was scribed for non-physician practitioner, Joycie Peek PA-C working with Toy Cookey, MD by Arlan Organ, ED Scribe. This patient was seen in room TR06C/TR06C and the patient's care was started at 8:59 PM.   Chief Complaint  Patient presents with  . Otalgia   The history is provided by the patient. No language interpreter was used.    HPI Comments: Denise Conrad is a 24 y.o. Female with a PMHx of TMJ, anxiety, and bipolar 1 disorder who presents to the Emergency Department complaining of constant, mild L sided otalgia x 1 day that is unchanged. Reports her left ear feels more "sensitive". Pt also reports chills and subjective fever onset last night that has now resolved. She has not tried any OTC medications or home remedies to help manage symptoms. Pt admits to using Q-Tip after taking showers. Ms. Davidovich takes a muscle relaxant at night time prior to bed for history of TMJ. No known allergies to medications.  Past Medical History  Diagnosis Date  . Depression   . Anxiety   . Bipolar 1 disorder    Past Surgical History  Procedure Laterality Date  . Wisdom tooth extraction     Family History  Problem Relation Age of Onset  . Hypertension Father   . Heart failure Father     hyptertensive cardiovascular disease   History  Substance Use Topics  . Smoking status: Never Smoker   . Smokeless tobacco: Not on file  . Alcohol Use: No   OB History    No data available     Review of Systems  Constitutional: Positive for fever (subjective) and chills.  HENT: Positive for ear pain.   All other systems reviewed and are negative.     Allergies  Review of patient's allergies indicates no known allergies.  Home Medications   Prior to Admission medications   Medication Sig Start Date End Date Taking? Authorizing Provider  ALPRAZolam  Prudy Feeler) 0.5 MG tablet Take 0.5 mg by mouth 3 (three) times daily as needed. For anxiety    Historical Provider, MD  Cholecalciferol (VITAMIN D-3) 1000 UNITS CAPS Take 2 capsules by mouth daily.    Historical Provider, MD  Cyanocobalamin (VITAMIN B 12 PO) Take 1 tablet by mouth daily.     Historical Provider, MD  cyclobenzaprine (FLEXERIL) 10 MG tablet Take 10 mg by mouth at bedtime. To prevent nocturnal jaw clenching    Historical Provider, MD  fesoterodine (TOVIAZ) 4 MG TB24 Take 4 mg by mouth at bedtime.     Historical Provider, MD  FLUoxetine (PROZAC) 40 MG capsule Take 40 mg by mouth at bedtime.     Historical Provider, MD  lamoTRIgine (LAMICTAL) 200 MG tablet Take 200 mg by mouth at bedtime.    Historical Provider, MD  lisdexamfetamine (VYVANSE) 30 MG capsule Take 30 mg by mouth daily.    Historical Provider, MD  MAGNESIUM PO Take 1 tablet by mouth daily.     Historical Provider, MD  Multiple Vitamins-Minerals (WOMENS MULTI VITAMIN & MINERAL PO) Take 1 tablet by mouth daily.    Historical Provider, MD  Omega-3 Fatty Acids (FISH OIL) 1200 MG CAPS Take 2 capsules by mouth daily.    Historical Provider, MD  polyethylene glycol powder (GLYCOLAX/MIRALAX) powder Take 17 g by mouth daily. 02/05/13   Angus Seller, PA-C  Triage Vitals: BP 113/79 mmHg  Pulse 124  Temp(Src) 98.5 F (36.9 C) (Oral)  Resp 16  Ht 5\' 6"  (1.676 m)  Wt 270 lb (122.471 kg)  BMI 43.60 kg/m2  SpO2 98%  LMP 01/06/2014   Physical Exam  Constitutional: She appears well-developed and well-nourished. No distress.  HENT:  Head: Normocephalic and atraumatic.  Right Ear: External ear normal.  Left Ear: External ear normal.  Mouth/Throat: Oropharynx is clear and moist. No oropharyngeal exudate.  TMs clear bilaterally with no drainage. No auricular tenderness. No mastoid tenderness  Eyes: Conjunctivae are normal. Right eye exhibits no discharge. Left eye exhibits no discharge. No scleral icterus.  Neck: Normal  range of motion. Neck supple.  Cardiovascular: Regular rhythm, S1 normal, S2 normal and normal heart sounds.  Tachycardia present.   Pulmonary/Chest: Effort normal and breath sounds normal. No respiratory distress.  Musculoskeletal: Normal range of motion.  Lymphadenopathy:    She has no cervical adenopathy.  Neurological: She is alert.  Skin: Skin is dry. She is not diaphoretic.  Nursing note and vitals reviewed.   ED Course  Procedures (including critical care time)  DIAGNOSTIC STUDIES: Oxygen Saturation is 98% on RA, Normal by my interpretation.    COORDINATION OF CARE: 9:04 PM-Discussed treatment plan with pt at bedside and pt agreed to plan.     Labs Review Labs Reviewed - No data to display  Imaging Review No results found.   EKG Interpretation None     Meds given in ED:  Medications - No data to display  Discharge Medication List as of 02/06/2014  9:36 PM     Filed Vitals:   02/06/14 1951 02/06/14 2003 02/06/14 2141  BP: 113/79  120/79  Pulse: 125 124 124  Temp: 98.5 F (36.9 C)  99 F (37.2 C)  TempSrc: Oral  Oral  Resp: 16  20  Height: 5\' 6"  (1.676 m)    Weight: 270 lb (122.471 kg)    SpO2: 97% 98% 96%    MDM  Vitals stable - -afebrile. Patient reports she normally has a fast heart rate due to chronic anxiety. Reports she feels anxious now because she does not like hospitals. Pt resting comfortably in ED.  PE--clear TMs, no adenopathy, jaw or neck tenderness. Normal physical exam.  DDX--low concern for any other acute or emergent pathology at this time. Patient sensitivity she experienced in her left ear likely due to repetitive use of Q-tips. Education provided on discontinued a Q-tip use. Take Motrin she has at home to help with discomfort and subjective fevers.  I discussed all relevant lab findings and imaging results with pt and they verbalized understanding. Discussed f/u with PCP within 48 hrs and return precautions, pt very amenable to  plan. PT stable, in good condition and is appropriate for discharge.  Final diagnoses:  Otalgia, left      I personally performed the services described in this documentation, which was scribed in my presence. The recorded information has been reviewed and is accurate.    Earle GellBenjamin W Cimarronartner, PA-C 02/07/14 1114  Toy CookeyMegan Docherty, MD 02/07/14 1227

## 2014-02-06 NOTE — ED Notes (Signed)
PA made aware of patients HR.  Ok to discharge patient.

## 2014-02-06 NOTE — Discharge Instructions (Signed)
It is important for you to follow up with primary care for further evaluation and management of your symptoms. Please take your Motrin for any discomfort he may experience. Return to ED for worsening symptoms, fevers, increased ear pain, drainage from your ear.

## 2014-02-21 ENCOUNTER — Encounter (HOSPITAL_COMMUNITY): Payer: Self-pay | Admitting: Emergency Medicine

## 2014-02-21 ENCOUNTER — Emergency Department (HOSPITAL_COMMUNITY)
Admission: EM | Admit: 2014-02-21 | Discharge: 2014-02-21 | Disposition: A | Discharge status: Home / Self Care | Payer: Medicaid Other | Attending: Emergency Medicine | Admitting: Emergency Medicine

## 2014-02-21 DIAGNOSIS — B373 Candidiasis of vulva and vagina: Secondary | ICD-10-CM | POA: Diagnosis not present

## 2014-02-21 DIAGNOSIS — Z3202 Encounter for pregnancy test, result negative: Secondary | ICD-10-CM | POA: Insufficient documentation

## 2014-02-21 DIAGNOSIS — Z79899 Other long term (current) drug therapy: Secondary | ICD-10-CM | POA: Diagnosis not present

## 2014-02-21 DIAGNOSIS — N898 Other specified noninflammatory disorders of vagina: Secondary | ICD-10-CM | POA: Diagnosis present

## 2014-02-21 DIAGNOSIS — B379 Candidiasis, unspecified: Secondary | ICD-10-CM

## 2014-02-21 DIAGNOSIS — F419 Anxiety disorder, unspecified: Secondary | ICD-10-CM | POA: Insufficient documentation

## 2014-02-21 DIAGNOSIS — F319 Bipolar disorder, unspecified: Secondary | ICD-10-CM | POA: Insufficient documentation

## 2014-02-21 DIAGNOSIS — Z91040 Latex allergy status: Secondary | ICD-10-CM | POA: Insufficient documentation

## 2014-02-21 DIAGNOSIS — R000 Tachycardia, unspecified: Secondary | ICD-10-CM | POA: Diagnosis not present

## 2014-02-21 LAB — URINALYSIS, ROUTINE W REFLEX MICROSCOPIC
Bilirubin Urine: NEGATIVE
Glucose, UA: NEGATIVE mg/dL
Hgb urine dipstick: NEGATIVE
Ketones, ur: NEGATIVE mg/dL
Nitrite: NEGATIVE
PH: 5.5 (ref 5.0–8.0)
PROTEIN: NEGATIVE mg/dL
SPECIFIC GRAVITY, URINE: 1.029 (ref 1.005–1.030)
Urobilinogen, UA: 0.2 mg/dL (ref 0.0–1.0)

## 2014-02-21 LAB — URINE MICROSCOPIC-ADD ON

## 2014-02-21 LAB — PREGNANCY, URINE: Preg Test, Ur: NEGATIVE

## 2014-02-21 LAB — WET PREP, GENITAL: Trich, Wet Prep: NONE SEEN

## 2014-02-21 MED ORDER — HYDROCODONE-ACETAMINOPHEN 5-325 MG PO TABS: 1.0000 | ORAL_TABLET | ORAL | Status: DC | PRN

## 2014-02-21 MED ORDER — FLUCONAZOLE 150 MG PO TABS
150.0000 mg | ORAL_TABLET | Freq: Once | ORAL | Status: AC
  Administered 2014-02-21: 150 mg via ORAL
  Filled 2014-02-21: qty 1

## 2014-02-21 MED ORDER — HYDROCODONE-ACETAMINOPHEN 5-325 MG PO TABS
2.0000 | ORAL_TABLET | Freq: Once | ORAL | Status: AC
  Administered 2014-02-21: 2 via ORAL
  Filled 2014-02-21: qty 2

## 2014-02-21 NOTE — ED Notes (Signed)
Stirups and speculum at bedside

## 2014-02-21 NOTE — Discharge Instructions (Signed)
You possibly have an inflamed skene's gland. Please go to the [facility] outpatient clinic tomorrow to be seen by OB/GYN. [phone]. Abdominal Pain, Women Abdominal (stomach, pelvic, or belly) pain can be caused by many things. It is important to tell your doctor:  The location of the pain.  Does it come and go or is it present all the time?  Are there things that start the pain (eating certain foods, exercise)?  Are there other symptoms associated with the pain (fever, nausea, vomiting, diarrhea)? All of this is helpful to know when trying to find the cause of the pain. CAUSES   Stomach: virus or bacteria infection, or ulcer.  Intestine: appendicitis (inflamed appendix), regional ileitis (Crohn's disease), ulcerative colitis (inflamed colon), irritable bowel syndrome, diverticulitis (inflamed diverticulum of the colon), or cancer of the stomach or intestine.  Gallbladder disease or stones in the gallbladder.  Kidney disease, kidney stones, or infection.  Pancreas infection or cancer.  Fibromyalgia (pain disorder).  Diseases of the female organs:  Uterus: fibroid (non-cancerous) tumors or infection.  Fallopian tubes: infection or tubal pregnancy.  Ovary: cysts or tumors.  Pelvic adhesions (scar tissue).  Endometriosis (uterus lining tissue growing in the pelvis and on the pelvic organs).  Pelvic congestion syndrome (female organs filling up with blood just before the menstrual period).  Pain with the menstrual period.  Pain with ovulation (producing an egg).  Pain with an IUD (intrauterine device, birth control) in the uterus.  Cancer of the female organs.  Functional pain (pain not caused by a disease, may improve without treatment).  Psychological pain.  Depression. DIAGNOSIS  Your doctor will decide the seriousness of your pain by doing an examination.  Blood tests.  X-rays.  Ultrasound.  CT scan (computed tomography, special type of X-ray).  MRI  (magnetic resonance imaging).  Cultures, for infection.  Barium enema (dye inserted in the large intestine, to better view it with X-rays).  Colonoscopy (looking in intestine with a lighted tube).  Laparoscopy (minor surgery, looking in abdomen with a lighted tube).  Major abdominal exploratory surgery (looking in abdomen with a large incision). TREATMENT  The treatment will depend on the cause of the pain.   Many cases can be observed and treated at home.  Over-the-counter medicines recommended by your caregiver.  Prescription medicine.  Antibiotics, for infection.  Birth control pills, for painful periods or for ovulation pain.  Hormone treatment, for endometriosis.  Nerve blocking injections.  Physical therapy.  Antidepressants.  Counseling with a psychologist or psychiatrist.  Minor or major surgery. HOME CARE INSTRUCTIONS   Do not take laxatives, unless directed by your caregiver.  Take over-the-counter pain medicine only if ordered by your caregiver. Do not take aspirin because it can cause an upset stomach or bleeding.  Try a clear liquid diet (broth or water) as ordered by your caregiver. Slowly move to a bland diet, as tolerated, if the pain is related to the stomach or intestine.  Have a thermometer and take your temperature several times a day, and record it.  Bed rest and sleep, if it helps the pain.  Avoid sexual intercourse, if it causes pain.  Avoid stressful situations.  Keep your follow-up appointments and tests, as your caregiver orders.  If the pain does not go away with medicine or surgery, you may try:  Acupuncture.  Relaxation exercises (yoga, meditation).  Group therapy.  Counseling. SEEK MEDICAL CARE IF:   You notice certain foods cause stomach pain.  Your home care treatment is  not helping your pain.  You need stronger pain medicine.  You want your IUD removed.  You feel faint or lightheaded.  You develop nausea and  vomiting.  You develop a rash.  You are having side effects or an allergy to your medicine. SEEK IMMEDIATE MEDICAL CARE IF:   Your pain does not go away or gets worse.  You have a fever.  Your pain is felt only in portions of the abdomen. The right side could possibly be appendicitis. The left lower portion of the abdomen could be colitis or diverticulitis.  You are passing blood in your stools (bright red or black tarry stools, with or without vomiting).  You have blood in your urine.  You develop chills, with or without a fever.  You pass out. MAKE SURE YOU:   Understand these instructions.  Will watch your condition.  Will get help right away if you are not doing well or get worse. Document Released: 11/09/2006 Document Revised: 05/29/2013 Document Reviewed: 11/29/2008 [facility] Patient Information 2015 [place], [place]. This information is not intended to replace advice given to you by your health care provider. Make sure you discuss any questions you have with your health care provider. Candidal Vulvovaginitis Candidal vulvovaginitis is an infection of the vagina and vulva. The vulva is the skin around the opening of the vagina. This may cause itching and discomfort in and around the vagina.  HOME CARE  Only take medicine as told by your doctor.  Do not have sex (intercourse) until the infection is healed or as told by your doctor.  Practice safe sex.  Tell your sex partner about your infection.  Do not douche or use tampons.  Wear cotton underwear. Do not wear tight pants or panty hose.  Eat yogurt. This may help treat and prevent yeast infections. GET HELP RIGHT AWAY IF:   You have a fever.  Your problems get worse during treatment or do not get better in 3 days.  You have discomfort, irritation, or itching in your vagina or vulva area.  You have pain after sex.  You start to get belly (abdominal) pain. MAKE SURE YOU:  Understand these  instructions.  Will watch your condition.  Will get help right away if you are not doing well or get worse. Document Released: 04/10/2008 Document Revised: 01/17/2013 Document Reviewed: 04/10/2008 [facility] Patient Information 2015 [place], [place]. This information is not intended to replace advice given to you by your health care provider. Make sure you discuss any questions you have with your health care provider.

## 2014-02-21 NOTE — ED Provider Notes (Signed)
CSN: [id number]     Arrival date & time 02/21/14  1654 History   First MD Initiated Contact with Patient 02/21/14 2202     Chief Complaint  Patient presents with  . Vaginal Mass    Denise Conrad is a 24 y.o. female with a history of depression, anxiety, and bipolar disorder who presents to the ED complaining of a vaginal mass for 3 days. She reports she called a vaginal mass 3 days ago after urinating. She also reports suprapubic abdominal pain that she rates an 8 out of 10 and describes this pressure. She also reports urinary frequency, and dysuria. She also reports vaginal discharge for 3 days. Patient is actually active and does not use protection. Patient reports she was just tested for STDs which were negative. She has history of STDs. She reports a history of Bartholin's cyst, but reports this feels different.  LMP was 02/14/14. The patient denies nausea, vomiting, constipation, diarrhea, fevers, chills, vaginal bleeding, hematuria.   (Consider location/radiation/quality/duration/timing/severity/associated sxs/prior Treatment) HPI  Past Medical History  Diagnosis Date  . Depression   . Anxiety   . Bipolar 1 disorder    Past Surgical History  Procedure Laterality Date  . Wisdom tooth extraction     Family History  Problem Relation Age of Onset  . Hypertension Father   . Heart failure Father     hyptertensive cardiovascular disease   History  Substance Use Topics  . Smoking status: Never Smoker   . Smokeless tobacco: Not on file  . Alcohol Use: No   OB History    No data available     Review of Systems  Constitutional: Negative for fever and chills.  HENT: Negative for congestion and sore throat.   Eyes: Negative for visual disturbance.  Respiratory: Negative for cough, shortness of breath and wheezing.   Cardiovascular: Negative for chest pain and palpitations.  Gastrointestinal: Positive for abdominal pain. Negative for nausea, vomiting, diarrhea and blood in  stool.  Genitourinary: Positive for dysuria, frequency and vaginal discharge. Negative for urgency, hematuria, vaginal bleeding, difficulty urinating and genital sores.  Musculoskeletal: Negative for back pain and neck pain.  Skin: Negative for rash.  Neurological: Negative for headaches.      Allergies  Latex  Home Medications   Prior to Admission medications   Medication Sig Start Date End Date Taking? Authorizing Provider  ALPRAZolam Prudy Feeler) 0.5 MG tablet Take 0.5 mg by mouth 3 (three) times daily as needed for anxiety (anxiety). For anxiety   Yes Historical Provider, MD  cyclobenzaprine (FLEXERIL) 10 MG tablet Take 10 mg by mouth at bedtime. To prevent nocturnal jaw clenching   Yes Historical Provider, MD  doxycycline (VIBRA-TABS) 100 MG tablet Take 100 mg by mouth at bedtime.   Yes Historical Provider, MD  DULoxetine (CYMBALTA) 60 MG capsule Take 60 mg by mouth at bedtime.   Yes Historical Provider, MD  fesoterodine (TOVIAZ) 4 MG TB24 Take 4 mg by mouth at bedtime.    Yes Historical Provider, MD  lamoTRIgine (LAMICTAL) 200 MG tablet Take 200 mg by mouth at bedtime.   Yes Historical Provider, MD  metoprolol tartrate (LOPRESSOR) 25 MG tablet Take 25 mg by mouth at bedtime.    Yes Historical Provider, MD  phentermine (ADIPEX-P) 37.5 MG tablet Take 37.5 mg by mouth daily before breakfast.   Yes Historical Provider, MD  HYDROcodone-acetaminophen (NORCO/VICODIN) 5-325 MG per tablet Take 1-2 tablets by mouth every 4 (four) hours as needed for moderate pain or severe pain.  02/21/14   Einar GipWilliam Duncan Miryam Mcelhinney, PA-C  polyethylene glycol powder (GLYCOLAX/MIRALAX) powder Take 17 g by mouth daily. Patient not taking: Reported on 02/21/2014 02/05/13   Phill MutterPeter S Dammen, PA-C   BP 132/84 mmHg  Pulse 119  Temp(Src) 99.3 F (37.4 C) (Oral)  Resp 18  SpO2 100%  LMP 12/26/2013 Physical Exam  Constitutional: She appears well-developed and well-nourished. No distress.  HENT:  Head: Normocephalic and  atraumatic.  Mouth/Throat: Oropharynx is clear and moist. No oropharyngeal exudate.  Eyes: Conjunctivae are normal. Pupils are equal, round, and reactive to light. Right eye exhibits no discharge. Left eye exhibits no discharge.  Neck: Neck supple.  Cardiovascular: Regular rhythm, normal heart sounds and intact distal pulses.  Exam reveals no gallop and no friction rub.   No murmur heard. Slightly tachycardic at 112  Pulmonary/Chest: Effort normal and breath sounds normal. No respiratory distress. She has no wheezes. She has no rales.  Abdominal: Soft. Bowel sounds are normal. She exhibits no distension and no mass. There is tenderness. There is no rebound and no guarding.  Patient has mild suprapubic tenderness to palpation.   Genitourinary:  Pelvic exam preformed by me and Dr. Criss AlvineGoldston with female NT as chaperone. No external lesions or rashes noted. Mild amount of white vaginal discharge. Small 1.5 cm cystic structure midline and near the urethra that is tender to palpation. No erythema. No drainage. Cervix is close and no cervical motion tenderness. No adnexal tenderness or fullness. No vaginal bleeding.   Musculoskeletal: She exhibits no edema.  Lymphadenopathy:    She has no cervical adenopathy.  Neurological: She is alert. Coordination normal.  Skin: Skin is warm and dry. No rash noted. She is not diaphoretic. No erythema. No pallor.  Psychiatric: She has a normal mood and affect. Her behavior is normal.  Nursing note and vitals reviewed.   ED Course  Procedures (including critical care time) Labs Review Labs Reviewed  WET PREP, GENITAL - Abnormal; Notable for the following:    Yeast Wet Prep HPF POC FEW (*)    Clue Cells Wet Prep HPF POC FEW (*)    WBC, Wet Prep HPF POC MANY (*)    All other components within normal limits  URINALYSIS, ROUTINE W REFLEX MICROSCOPIC - Abnormal; Notable for the following:    APPearance CLOUDY (*)    Leukocytes, UA MODERATE (*)    All other  components within normal limits  URINE MICROSCOPIC-ADD ON - Abnormal; Notable for the following:    Bacteria, UA MANY (*)    All other components within normal limits  URINE CULTURE  PREGNANCY, URINE  GC/CHLAMYDIA PROBE AMP ([place])    Imaging Review No results found.   EKG Interpretation None      Filed Vitals:   02/21/14 2128 02/21/14 2130 02/21/14 2131 02/21/14 2335  BP: 95/49  95/49 132/84  Pulse:  116 113 119  Temp:      TempSrc:      Resp:   18 18  SpO2:  95% 99% 100%     MDM   Meds given in ED:  Medications  fluconazole (DIFLUCAN) tablet 150 mg (150 mg Oral Given 02/21/14 2338)  HYDROcodone-acetaminophen (NORCO/VICODIN) 5-325 MG per tablet 2 tablet (2 tablets Oral Given 02/21/14 2337)    Discharge Medication List as of 02/21/2014 11:39 PM    START taking these medications   Details  HYDROcodone-acetaminophen (NORCO/VICODIN) 5-325 MG per tablet Take 1-2 tablets by mouth every 4 (four) hours as needed for  moderate pain or severe pain., Starting 02/21/2014, Until Discontinued, Print        Final diagnoses:  Yeast infection  Vaginal mass   This is a 24 year old female who presented to the emergency department complaining of a vaginal mass for the past 3 days. She also reports some suprapubic abdominal pain, dysuria, vaginal discharge for 3 days. The patient is afebrile and non-toxic appearing. On pelvic exam there is a small 1.5 cm cystic structure midline and near the urethra that is tender to palpation. No erythema. No drainage.  Possibly a Skene's gland. Does not look like bladder prolapse or bartholin cyst. Wet prep shows few clue cells and few yeast. Will treat for yeast infection. UA is nitrite negative with moderate leuks. Urine sent for culture. Dr. Criss Alvine spoke with OB/GYN at [facility] hospital who would like her to been seen at the [facility] outpatient clinic tomorrow morning. Patient provided information and number for this clinic. Patient provided Norco  and the ED. Patient provided prescription for Norco for pain control. Narcotic pain medication precautions provided. I advised the patient to follow-up at the [facility] outpatient clinic tomorrow. I advised the patient to return to the emergency department with new or worsening symptoms or new concerns. The patient verbalized understanding and agreement with plan.   This patient was discussed with and evaluated by Dr. Criss Alvine who agrees with assessment and plan.      Lawana Chambers, PA-C 02/22/14 1610  Audree Camel, MD 03/02/14 [phone]

## 2014-02-21 NOTE — ED Notes (Addendum)
Pt c/o "vaginal mass." Has picture of vaginal mass-pink in color and is near urethra. No vaginal bleeding. Has been taking Toviax for three years. Hx frequent urination after having first child. Patient thinks she has herniated bladder. Also having abdominal cramping. Last menstrual cycle stopped this past Saturday. Pt reports "feeling like I'm sitting on a golf ball." No other complaints/concerns.

## 2014-02-22 ENCOUNTER — Inpatient Hospital Stay (HOSPITAL_COMMUNITY)
Admission: AD | Admit: 2014-02-22 | Discharge: 2014-02-22 | Disposition: A | Discharge status: Home / Self Care | Payer: Medicaid Other | Source: Ambulatory Visit | Attending: Obstetrics & Gynecology | Admitting: Obstetrics & Gynecology

## 2014-02-22 ENCOUNTER — Encounter (HOSPITAL_COMMUNITY): Payer: Self-pay | Admitting: *Deleted

## 2014-02-22 DIAGNOSIS — N949 Unspecified condition associated with female genital organs and menstrual cycle: Secondary | ICD-10-CM | POA: Diagnosis present

## 2014-02-22 DIAGNOSIS — N899 Noninflammatory disorder of vagina, unspecified: Secondary | ICD-10-CM | POA: Insufficient documentation

## 2014-02-22 DIAGNOSIS — N361 Urethral diverticulum: Secondary | ICD-10-CM | POA: Diagnosis not present

## 2014-02-22 LAB — GC/CHLAMYDIA PROBE AMP (~~LOC~~) NOT AT ARMC
CHLAMYDIA, DNA PROBE: NEGATIVE
Neisseria Gonorrhea: NEGATIVE

## 2014-02-22 NOTE — MAU Note (Signed)
Pt reports having a "mass in her vagina"  Since Monday. [facility] Told to come her for further eval. (pt had picture on phone looks like a vaginal possible bartholin cyst).

## 2014-02-22 NOTE — Discharge Instructions (Signed)
You will need to see the Urologist for the cyst. Call to schedule an appointment.

## 2014-02-22 NOTE — MAU Provider Note (Signed)
CSN: [id number]     Arrival date & time 02/22/14  1647 History   None    Chief Complaint  Patient presents with  . Groin Swelling     (Consider location/radiation/quality/duration/timing/severity/associated sxs/prior Treatment) The history is provided by the patient.   Luvenia HellerSamantha Kock is a 24 y.o. female who presents to the MAU with vaginal pain and an area near the urethra that is swollen. [facility] sure what [facility] had and to follow up here.  The patient is very concerned about the area. She states that she does have some discomfort but is more worried about what it is. She denies any other problems. She denies UTI symptoms.   Past Medical History  Diagnosis Date  . Depression   . Anxiety   . Bipolar 1 disorder    Past Surgical History  Procedure Laterality Date  . Wisdom tooth extraction     Family History  Problem Relation Age of Onset  . Hypertension Father   . Heart failure Father     hyptertensive cardiovascular disease   History  Substance Use Topics  . Smoking status: Never Smoker   . Smokeless tobacco: Not on file  . Alcohol Use: No   OB History    Gravida Para Term Preterm AB TAB SAB Ectopic Multiple Living   21    1  1   1      Review of Systems Negative except as stated in HPI   Allergies  Latex  Home Medications   Prior to Admission medications   Medication Sig Start Date End Date Taking? Authorizing Provider  ALPRAZolam Prudy Feeler(XANAX) 0.5 MG tablet Take 0.5 mg by mouth 3 (three) times daily as needed for anxiety (anxiety). For anxiety   Yes Historical Provider, MD  cyclobenzaprine (FLEXERIL) 10 MG tablet Take 10 mg by mouth at bedtime. To prevent nocturnal jaw clenching   Yes Historical Provider, MD  doxycycline (VIBRA-TABS) 100 MG tablet Take 100 mg by mouth at bedtime.   Yes Historical Provider, MD  DULoxetine (CYMBALTA) 60 MG capsule Take 60 mg by mouth at bedtime.   Yes Historical Provider, MD  fesoterodine  (TOVIAZ) 4 MG TB24 Take 4 mg by mouth at bedtime.    Yes Historical Provider, MD  HYDROcodone-acetaminophen (NORCO/VICODIN) 5-325 MG per tablet Take 1-2 tablets by mouth every 4 (four) hours as needed for moderate pain or severe pain. 02/21/14  Yes Einar GipWilliam Duncan Dansie, PA-C  lamoTRIgine (LAMICTAL) 200 MG tablet Take 200 mg by mouth at bedtime.   Yes Historical Provider, MD  metoprolol tartrate (LOPRESSOR) 25 MG tablet Take 25 mg by mouth at bedtime.    Yes Historical Provider, MD  phentermine (ADIPEX-P) 37.5 MG tablet Take 37.5 mg by mouth daily before breakfast.   Yes Historical Provider, MD  polyethylene glycol powder (GLYCOLAX/MIRALAX) powder Take 17 g by mouth daily. 02/05/13  Yes Peter S Dammen, PA-C   BP 127/86 mmHg  Pulse 96  Temp(Src) 98.7 F (37.1 C) (Oral)  Resp 18  Ht 5\' 5"  (1.651 m)  Wt 276 lb 4 oz (125.306 kg)  BMI 45.97 kg/m2  SpO2 100%  LMP 02/14/2014 Physical Exam  Constitutional: She is oriented to person, place, and time. She appears well-developed and well-nourished.  HENT:  Head: Normocephalic.  Eyes: EOM are normal.  Neck: Neck supple.  Cardiovascular: Normal rate.   Pulmonary/Chest: Effort normal.  Abdominal: Soft. There is no tenderness.  Genitourinary:     There is  a swollen cystic area just beneath the urethra that is mildly tender on exam.   Musculoskeletal: Normal range of motion.  Neurological: She is alert and oriented to person, place, and time. No cranial nerve deficit.  Skin: Skin is warm and dry.  Psychiatric: She has a normal mood and affect. Her behavior is normal.  Nursing note and vitals reviewed.   ED Course  Procedures (including critical care time) Labs Review Results for orders placed or performed during the hospital encounter of 02/21/14 (from the past 24 hour(s))  Urinalysis, Routine w reflex microscopic     Status: Abnormal   Collection Time: 02/21/14 10:20 PM  Result Value Ref Range   Color, Urine YELLOW YELLOW   APPearance  CLOUDY (A) CLEAR   Specific Gravity, Urine 1.029 1.005 - 1.030   pH 5.5 5.0 - 8.0   Glucose, UA NEGATIVE NEGATIVE mg/dL   Hgb urine dipstick NEGATIVE NEGATIVE   Bilirubin Urine NEGATIVE NEGATIVE   Ketones, ur NEGATIVE NEGATIVE mg/dL   Protein, ur NEGATIVE NEGATIVE mg/dL   Urobilinogen, UA 0.2 0.0 - 1.0 mg/dL   Nitrite NEGATIVE NEGATIVE   Leukocytes, UA MODERATE (A) NEGATIVE  Pregnancy, urine     Status: None   Collection Time: 02/21/14 10:20 PM  Result Value Ref Range   Preg Test, Ur NEGATIVE NEGATIVE  Urine microscopic-add on     Status: Abnormal   Collection Time: 02/21/14 10:20 PM  Result Value Ref Range   Squamous Epithelial / LPF RARE RARE   WBC, UA 11-20 <3 WBC/hpf   RBC / HPF 3-6 <3 RBC/hpf   Bacteria, UA MANY (A) RARE  Wet prep, genital     Status: Abnormal   Collection Time: 02/21/14 10:50 PM  Result Value Ref Range   Yeast Wet Prep HPF POC FEW (A) NONE SEEN   Trich, Wet Prep NONE SEEN NONE SEEN   Clue Cells Wet Prep HPF POC FEW (A) NONE SEEN   WBC, Wet Prep HPF POC MANY (A) NONE SEEN    Dr. Macon Large in to examine the patient and discuss diagnosis and plan of care.   MDM  24 y.o. female with cystic area just below the urethra. Stable for discharge without UTI symptoms, bleeding or difficulty urinating. She will follow up with urology for further evaluation. She will return here as needed. Urine sent for culture.   Final diagnoses:  Urethral diverticulum

## 2014-02-22 NOTE — MAU Note (Signed)
Pt states she felt a mass in her vagina, pt states she did research and was worried  About undiagnoised mass.Pt denies pain but feels uncomfortable when she voids

## 2014-02-23 LAB — URINE CULTURE
Colony Count: NO GROWTH
Culture: NO GROWTH

## 2014-06-13 ENCOUNTER — Ambulatory Visit: Payer: Medicaid Other | Attending: Audiology | Admitting: Audiology

## 2014-06-13 DIAGNOSIS — Z789 Other specified health status: Secondary | ICD-10-CM | POA: Diagnosis not present

## 2014-06-13 DIAGNOSIS — Z01118 Encounter for examination of ears and hearing with other abnormal findings: Secondary | ICD-10-CM

## 2014-06-13 DIAGNOSIS — Z0110 Encounter for examination of ears and hearing without abnormal findings: Secondary | ICD-10-CM | POA: Insufficient documentation

## 2014-06-13 DIAGNOSIS — Z8669 Personal history of other diseases of the nervous system and sense organs: Secondary | ICD-10-CM | POA: Diagnosis not present

## 2014-06-13 DIAGNOSIS — R94128 Abnormal results of other function studies of ear and other special senses: Secondary | ICD-10-CM | POA: Insufficient documentation

## 2014-06-13 NOTE — Procedures (Signed)
[facility] [address] [place], [place] [address] [phone]  AUDIOLOGICAL EVALUATION  Name: Denise Conrad Spoerl DOB:  02/20/1990 MRN:  [id number]                                 Diagnosis:Decreased hearing/left ear deafness Date: 06/13/2014    Referent: Tomi BambergerFULLER,SUSAN, NP  HISTORY: Denise Conrad Larrivee, age 24 y.o. years, was seen for an audiological evaluation. She states that she "has never had her hearing tested before". She reports "hearing loss in the left ear" with bilateral "intermittant tinnitus".  Lelon MastSamantha states that the tinnitus started about "12 months ago" and that she has "recently noticed that it has become worse."  Lelon MastSamantha reports that she "had an eraser stuck in her left ear canal" for several years as an elementary school child that caused ear infections. She states that the "eraser was removed" when she was "a teenager". Lelon MastSamantha states that she has "migraines from loud sounds or looking at screens(TV, cellphone, etc)".  She also notes that her "hands always shake" and that she may be getting a referral to a neurologist.  Medication: Lamictal 200mg , Cymbolta 60mg , Flexeril 10mg , Vyvance 50mg .       EVALUATION: Pure tone air and tone conduction was completed using conventional audiometry with inserts. Hearing thresholds are 15-20 dBHL bilaterally except for a 25 dBHL hearing threshold at 2000Hz  on the left side. Speech reception thresholds are 15 dBHL in the right ear and 15/20 dBHL in the left ear using recorded spondee words.  The reliability is good. Word recognition is 100% at 55dBHL in the right and 95% at 55dBHL in the left using recorded NU-6 word lists in quiet. In minimal background noise with +5dB signal to noise ratio word recogntion is 76 % in the right ear and 72% in the left ear using PBK word lists in multitalker noise. Otoscopic inspection reveals clear ear canals with visible tympanic membranes.   Tympanometry showed is within  normal limits bilaterally for volume, compliance and pressure (Type A).  Ipsilateral acoustic reflexes are present and within normal limits bilaterally.  Distortion Product Otoacoustic Emission (DPOAE) testing from 2000Hz  - 10,000Hz  are present bilaterally but are borderline on the left side and present on the right except for an absent response at 10kHz only.   CONCLUSION:      Denise Conrad Laiche has normal hearing thresholds bilaterally with normal inner ear function and acoustic reflexes bilaterally.  The inner ear function is present bilaterally but are borderline at some frequencies on the left side and in the right ear are present at all frequencies except at [facility]kHz.  A repeat audiogram and inner ear function testing is recommended in 6 months to rule out a progressive hearing loss - test earlier if tinnitus or hearing become worse.    Please rule out medication as a contributor to the tinnitus.  Lelon MastSamantha states that she started Flexeril about 18 months ago and that the tinnitus started about 12 months ago. She was cautioned to use hearing protection and avoid loud sounds for hearing conservation, but she reports that "loud sounds cause a migraine" so she tends to avoid loud areas.     Lelon MastSamantha has excellent word recognition in quiet at normal conversational speech levels. In minimal background noise, her word recognition drops somewhat bilaterally.  She reports difficulty hearing in minimal background noise, but that this is not a recent change.    RECOMMENDATIONS: 1.  Monitor hearing closely with a repeat audiological evaluation in 6 months (earlier if there is any change in hearing or ear pressure) to measure inner ear function, tinnitus and hearing thresholds. This hearing evaluation has been scheduled for December 25, 2014 at 10am. 2.   Evaluate medications to rule out tinnitus as a side effect (i.e. Flexeril is associated with tinnitus). 3.   Minimize exposure to loud sounds and practice  hearing conservation.   Deborah L. Kate SableWoodward, Au.D., CCC-A Doctor of Audiology  06/13/2014  cc: Tomi BambergerFULLER,SUSAN, NP

## 2014-09-06 ENCOUNTER — Other Ambulatory Visit: Payer: Self-pay | Admitting: Nurse Practitioner

## 2014-09-06 DIAGNOSIS — N6320 Unspecified lump in the left breast, unspecified quadrant: Secondary | ICD-10-CM

## 2014-09-11 ENCOUNTER — Other Ambulatory Visit: Payer: Self-pay | Admitting: Nurse Practitioner

## 2014-09-11 DIAGNOSIS — N6315 Unspecified lump in the right breast, overlapping quadrants: Secondary | ICD-10-CM

## 2014-09-11 DIAGNOSIS — N6310 Unspecified lump in the right breast, unspecified quadrant: Principal | ICD-10-CM

## 2014-09-11 LAB — PAP LB, CT-NG, RFX HPV ASCU: Pap: ABNORMAL — AB

## 2014-09-12 ENCOUNTER — Ambulatory Visit
Admission: RE | Admit: 2014-09-12 | Discharge: 2014-09-12 | Disposition: A | Discharge status: Home / Self Care | Payer: Medicaid Other | Source: Ambulatory Visit | Attending: Nurse Practitioner | Admitting: Nurse Practitioner

## 2014-09-12 DIAGNOSIS — N6320 Unspecified lump in the left breast, unspecified quadrant: Secondary | ICD-10-CM

## 2014-09-12 DIAGNOSIS — N6310 Unspecified lump in the right breast, unspecified quadrant: Principal | ICD-10-CM

## 2014-09-12 DIAGNOSIS — N6315 Unspecified lump in the right breast, overlapping quadrants: Secondary | ICD-10-CM

## 2014-09-20 ENCOUNTER — Encounter: Payer: Self-pay | Admitting: *Deleted

## 2014-09-28 ENCOUNTER — Encounter: Payer: Self-pay | Admitting: Obstetrics & Gynecology

## 2014-09-28 ENCOUNTER — Encounter: Payer: Self-pay | Admitting: *Deleted

## 2014-09-28 ENCOUNTER — Ambulatory Visit (INDEPENDENT_AMBULATORY_CARE_PROVIDER_SITE_OTHER): Payer: Medicaid Other | Admitting: Obstetrics & Gynecology

## 2014-09-28 VITALS — BP 137/87 | HR 109 | Ht 66.0 in | Wt 292.0 lb

## 2014-09-28 DIAGNOSIS — Z01812 Encounter for preprocedural laboratory examination: Secondary | ICD-10-CM

## 2014-09-28 DIAGNOSIS — N879 Dysplasia of cervix uteri, unspecified: Secondary | ICD-10-CM | POA: Insufficient documentation

## 2014-09-28 DIAGNOSIS — R87611 Atypical squamous cells cannot exclude high grade squamous intraepithelial lesion on cytologic smear of cervix (ASC-H): Secondary | ICD-10-CM | POA: Diagnosis not present

## 2014-09-28 LAB — POCT URINE PREGNANCY: PREG TEST UR: NEGATIVE

## 2014-09-28 NOTE — Progress Notes (Signed)
   Subjective:    Patient ID: Denise Conrad, female    DOB: Jun 16, 1990, 24 y.o.   MRN: [id number]  HPI  24 yo P88 (96 yo son) here for a colpo due to a pap last month that showed ASCUS, cannot rule out HGSIL.  Review of Systems     Objective:   Physical Exam  WNWHWFNAD UPT negative, consent signed, time out done Cervix prepped with acetic acid. Transformation zone seen in its entirety. Colpo adequate. Large ectropion (She is on OCPs.) At the edge of the ectropion, changes of mosaicism and acetowhite changes were seen. I biopsied at the 12 o'clock position. ECC obtained. She tolerated the procedure well.        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 RTC 2 weeks

## 2014-11-20 ENCOUNTER — Inpatient Hospital Stay (HOSPITAL_COMMUNITY)
Admission: EM | Admit: 2014-11-20 | Discharge: 2014-11-25 | DRG: 871 | Disposition: A | Discharge status: Home / Self Care | Payer: Medicaid Other | Attending: Internal Medicine | Admitting: Internal Medicine

## 2014-11-20 ENCOUNTER — Emergency Department (HOSPITAL_COMMUNITY): Payer: Medicaid Other

## 2014-11-20 ENCOUNTER — Encounter (HOSPITAL_COMMUNITY): Payer: Self-pay | Admitting: *Deleted

## 2014-11-20 DIAGNOSIS — E871 Hypo-osmolality and hyponatremia: Secondary | ICD-10-CM | POA: Diagnosis present

## 2014-11-20 DIAGNOSIS — F419 Anxiety disorder, unspecified: Secondary | ICD-10-CM | POA: Diagnosis present

## 2014-11-20 DIAGNOSIS — Z6841 Body Mass Index (BMI) 40.0 and over, adult: Secondary | ICD-10-CM | POA: Diagnosis not present

## 2014-11-20 DIAGNOSIS — J039 Acute tonsillitis, unspecified: Secondary | ICD-10-CM | POA: Diagnosis not present

## 2014-11-20 DIAGNOSIS — N179 Acute kidney failure, unspecified: Secondary | ICD-10-CM | POA: Diagnosis present

## 2014-11-20 DIAGNOSIS — A419 Sepsis, unspecified organism: Secondary | ICD-10-CM

## 2014-11-20 DIAGNOSIS — A409 Streptococcal sepsis, unspecified: Secondary | ICD-10-CM | POA: Diagnosis not present

## 2014-11-20 DIAGNOSIS — J020 Streptococcal pharyngitis: Secondary | ICD-10-CM | POA: Diagnosis present

## 2014-11-20 DIAGNOSIS — E876 Hypokalemia: Secondary | ICD-10-CM | POA: Diagnosis not present

## 2014-11-20 DIAGNOSIS — F329 Major depressive disorder, single episode, unspecified: Secondary | ICD-10-CM | POA: Diagnosis present

## 2014-11-20 DIAGNOSIS — N170 Acute kidney failure with tubular necrosis: Secondary | ICD-10-CM | POA: Diagnosis present

## 2014-11-20 DIAGNOSIS — N390 Urinary tract infection, site not specified: Secondary | ICD-10-CM | POA: Diagnosis present

## 2014-11-20 DIAGNOSIS — F319 Bipolar disorder, unspecified: Secondary | ICD-10-CM | POA: Diagnosis not present

## 2014-11-20 DIAGNOSIS — R509 Fever, unspecified: Secondary | ICD-10-CM | POA: Diagnosis present

## 2014-11-20 DIAGNOSIS — E669 Obesity, unspecified: Secondary | ICD-10-CM | POA: Diagnosis present

## 2014-11-20 HISTORY — DX: Sepsis, unspecified organism: A41.9

## 2014-11-20 HISTORY — DX: Acute tonsillitis, unspecified: J03.90

## 2014-11-20 LAB — URINALYSIS, ROUTINE W REFLEX MICROSCOPIC
Bilirubin Urine: NEGATIVE
GLUCOSE, UA: NEGATIVE mg/dL
Hgb urine dipstick: NEGATIVE
KETONES UR: NEGATIVE mg/dL
Nitrite: NEGATIVE
PH: 5.5 (ref 5.0–8.0)
Protein, ur: NEGATIVE mg/dL
Specific Gravity, Urine: 1.016 (ref 1.005–1.030)
Urobilinogen, UA: 0.2 mg/dL (ref 0.0–1.0)

## 2014-11-20 LAB — COMPREHENSIVE METABOLIC PANEL
ALT: 23 U/L (ref 14–54)
AST: 32 U/L (ref 15–41)
Albumin: 3.2 g/dL — ABNORMAL LOW (ref 3.5–5.0)
Alkaline Phosphatase: 65 U/L (ref 38–126)
Anion gap: 14 (ref 5–15)
BILIRUBIN TOTAL: 0.5 mg/dL (ref 0.3–1.2)
BUN: <5 mg/dL — ABNORMAL LOW (ref 6–20)
CALCIUM: 9.3 mg/dL (ref 8.9–10.3)
CHLORIDE: 96 mmol/L — AB (ref 101–111)
CO2: 23 mmol/L (ref 22–32)
CREATININE: 0.85 mg/dL (ref 0.44–1.00)
Glucose, Bld: 144 mg/dL — ABNORMAL HIGH (ref 65–99)
POTASSIUM: 3.6 mmol/L (ref 3.5–5.1)
Sodium: 133 mmol/L — ABNORMAL LOW (ref 135–145)
TOTAL PROTEIN: 7.4 g/dL (ref 6.5–8.1)

## 2014-11-20 LAB — BASIC METABOLIC PANEL
ANION GAP: 8 (ref 5–15)
BUN: <5 mg/dL — ABNORMAL LOW (ref 6–20)
CALCIUM: 7.7 mg/dL — AB (ref 8.9–10.3)
CO2: 25 mmol/L (ref 22–32)
Chloride: 101 mmol/L (ref 101–111)
Creatinine, Ser: 0.79 mg/dL (ref 0.44–1.00)
GLUCOSE: 107 mg/dL — AB (ref 65–99)
Potassium: 3.6 mmol/L (ref 3.5–5.1)
SODIUM: 134 mmol/L — AB (ref 135–145)

## 2014-11-20 LAB — CBC
HCT: 36.8 % (ref 36.0–46.0)
Hemoglobin: 12.1 g/dL (ref 12.0–15.0)
MCH: 27.6 pg (ref 26.0–34.0)
MCHC: 32.9 g/dL (ref 30.0–36.0)
MCV: 84 fL (ref 78.0–100.0)
PLATELETS: 311 10*3/uL (ref 150–400)
RBC: 4.38 MIL/uL (ref 3.87–5.11)
RDW: 14 % (ref 11.5–15.5)
WBC: 22.9 10*3/uL — AB (ref 4.0–10.5)

## 2014-11-20 LAB — LACTIC ACID, PLASMA
LACTIC ACID, VENOUS: 1.4 mmol/L (ref 0.5–2.0)
LACTIC ACID, VENOUS: 1.8 mmol/L (ref 0.5–2.0)

## 2014-11-20 LAB — CBC WITH DIFFERENTIAL/PLATELET
BASOS PCT: 0 %
Basophils Absolute: 0 10*3/uL (ref 0.0–0.1)
EOS PCT: 0 %
Eosinophils Absolute: 0 10*3/uL (ref 0.0–0.7)
HCT: 41.4 % (ref 36.0–46.0)
Hemoglobin: 14.2 g/dL (ref 12.0–15.0)
LYMPHS ABS: 2.7 10*3/uL (ref 0.7–4.0)
Lymphocytes Relative: 8 %
MCH: 28.6 pg (ref 26.0–34.0)
MCHC: 34.3 g/dL (ref 30.0–36.0)
MCV: 83.5 fL (ref 78.0–100.0)
MONO ABS: 2 10*3/uL — AB (ref 0.1–1.0)
Monocytes Relative: 6 %
Neutro Abs: 28.7 10*3/uL — ABNORMAL HIGH (ref 1.7–7.7)
Neutrophils Relative %: 86 %
PLATELETS: 381 10*3/uL (ref 150–400)
RBC: 4.96 MIL/uL (ref 3.87–5.11)
RDW: 13.9 % (ref 11.5–15.5)
Smear Review: ADEQUATE
WBC: 33.4 10*3/uL — ABNORMAL HIGH (ref 4.0–10.5)

## 2014-11-20 LAB — I-STAT BETA HCG BLOOD, ED (MC, WL, AP ONLY): I-stat hCG, quantitative: <5 m[IU]/mL (ref <5)

## 2014-11-20 LAB — RAPID URINE DRUG SCREEN, HOSP PERFORMED
AMPHETAMINES: POSITIVE — AB
BARBITURATES: NOT DETECTED
BENZODIAZEPINES: NOT DETECTED
Cocaine: NOT DETECTED
Opiates: POSITIVE — AB
Tetrahydrocannabinol: NOT DETECTED

## 2014-11-20 LAB — URINE MICROSCOPIC-ADD ON

## 2014-11-20 LAB — I-STAT CG4 LACTIC ACID, ED: Lactic Acid, Venous: 3.12 mmol/L (ref 0.5–2.0)

## 2014-11-20 LAB — RAPID STREP SCREEN (MED CTR MEBANE ONLY): STREPTOCOCCUS, GROUP A SCREEN (DIRECT): POSITIVE — AB

## 2014-11-20 LAB — GLUCOSE, CAPILLARY: Glucose-Capillary: 135 mg/dL — ABNORMAL HIGH (ref 65–99)

## 2014-11-20 LAB — CBG MONITORING, ED: GLUCOSE-CAPILLARY: 159 mg/dL — AB (ref 65–99)

## 2014-11-20 MED ORDER — LAMOTRIGINE 25 MG PO TABS
200.0000 mg | ORAL_TABLET | Freq: Every day | ORAL | Status: DC
  Administered 2014-11-22 – 2014-11-24 (×3): 200 mg via ORAL
  Filled 2014-11-20 (×5): qty 8

## 2014-11-20 MED ORDER — ACETAMINOPHEN 650 MG RE SUPP: 650.0000 mg | Freq: Four times a day (QID) | RECTAL | Status: DC | PRN

## 2014-11-20 MED ORDER — ONDANSETRON HCL 4 MG/2ML IJ SOLN
4.0000 mg | Freq: Four times a day (QID) | INTRAMUSCULAR | Status: DC | PRN
  Administered 2014-11-21: 4 mg via INTRAVENOUS
  Filled 2014-11-20: qty 2

## 2014-11-20 MED ORDER — ONDANSETRON HCL 4 MG PO TABS: 4.0000 mg | ORAL_TABLET | Freq: Four times a day (QID) | ORAL | Status: DC | PRN

## 2014-11-20 MED ORDER — PIPERACILLIN-TAZOBACTAM 3.375 G IVPB 30 MIN
3.3750 g | Freq: Once | INTRAVENOUS | Status: AC
  Administered 2014-11-20: 3.375 g via INTRAVENOUS
  Filled 2014-11-20: qty 50

## 2014-11-20 MED ORDER — KETOROLAC TROMETHAMINE 30 MG/ML IJ SOLN
30.0000 mg | Freq: Once | INTRAMUSCULAR | Status: AC
  Administered 2014-11-20: 30 mg via INTRAVENOUS
  Filled 2014-11-20: qty 1

## 2014-11-20 MED ORDER — SODIUM CHLORIDE 0.9 % IV BOLUS (SEPSIS)
500.0000 mL | Freq: Once | INTRAVENOUS | Status: AC
  Administered 2014-11-20: 500 mL via INTRAVENOUS

## 2014-11-20 MED ORDER — SODIUM CHLORIDE 0.9 % IV BOLUS (SEPSIS)
2000.0000 mL | Freq: Once | INTRAVENOUS | Status: AC
  Administered 2014-11-20: 2000 mL via INTRAVENOUS

## 2014-11-20 MED ORDER — PIPERACILLIN-TAZOBACTAM 3.375 G IVPB
3.3750 g | Freq: Three times a day (TID) | INTRAVENOUS | Status: DC
  Administered 2014-11-20 – 2014-11-23 (×10): 3.375 g via INTRAVENOUS
  Filled 2014-11-20 (×13): qty 50

## 2014-11-20 MED ORDER — HYDROCODONE-ACETAMINOPHEN 5-325 MG PO TABS
1.0000 | ORAL_TABLET | ORAL | Status: DC | PRN
  Administered 2014-11-20 – 2014-11-22 (×4): 1 via ORAL
  Filled 2014-11-20 (×5): qty 1

## 2014-11-20 MED ORDER — PIPERACILLIN-TAZOBACTAM 3.375 G IVPB 30 MIN: 3.3750 g | Freq: Once | INTRAVENOUS | Status: DC

## 2014-11-20 MED ORDER — ACETAMINOPHEN 325 MG PO TABS: 650.0000 mg | ORAL_TABLET | Freq: Four times a day (QID) | ORAL | Status: DC | PRN

## 2014-11-20 MED ORDER — PHENOL 1.4 % MT LIQD
1.0000 | OROMUCOSAL | Status: DC | PRN
  Administered 2014-11-20: 1 via OROMUCOSAL
  Filled 2014-11-20: qty 177

## 2014-11-20 MED ORDER — SODIUM CHLORIDE 0.9 % IV SOLN
INTRAVENOUS | Status: DC
  Administered 2014-11-20 – 2014-11-21 (×2): via INTRAVENOUS

## 2014-11-20 MED ORDER — CLONAZEPAM 0.5 MG PO TABS
0.5000 mg | ORAL_TABLET | Freq: Two times a day (BID) | ORAL | Status: DC | PRN
  Administered 2014-11-23: 0.5 mg via ORAL
  Filled 2014-11-20: qty 1

## 2014-11-20 MED ORDER — VANCOMYCIN HCL IN DEXTROSE 1-5 GM/200ML-% IV SOLN: 1000.0000 mg | Freq: Once | INTRAVENOUS | Status: DC

## 2014-11-20 MED ORDER — SODIUM CHLORIDE 0.9 % IJ SOLN
3.0000 mL | Freq: Two times a day (BID) | INTRAMUSCULAR | Status: DC
Start: 2014-11-20 — End: 2014-11-25
  Administered 2014-11-20 – 2014-11-24 (×5): 3 mL via INTRAVENOUS

## 2014-11-20 MED ORDER — ACETAMINOPHEN 650 MG RE SUPP
650.0000 mg | Freq: Once | RECTAL | Status: DC
  Filled 2014-11-20: qty 1

## 2014-11-20 MED ORDER — DULOXETINE HCL 60 MG PO CPEP
60.0000 mg | ORAL_CAPSULE | Freq: Every day | ORAL | Status: DC
  Administered 2014-11-22: 60 mg via ORAL
  Filled 2014-11-20 (×2): qty 1

## 2014-11-20 MED ORDER — ENOXAPARIN SODIUM 80 MG/0.8ML ~~LOC~~ SOLN
65.0000 mg | SUBCUTANEOUS | Status: DC
  Filled 2014-11-20: qty 0.8

## 2014-11-20 MED ORDER — IBUPROFEN 600 MG PO TABS
600.0000 mg | ORAL_TABLET | Freq: Four times a day (QID) | ORAL | Status: DC | PRN
  Administered 2014-11-21: 600 mg via ORAL
  Filled 2014-11-20: qty 1

## 2014-11-20 MED ORDER — FESOTERODINE FUMARATE ER 4 MG PO TB24
4.0000 mg | ORAL_TABLET | Freq: Every day | ORAL | Status: DC
  Administered 2014-11-22: 4 mg via ORAL
  Filled 2014-11-20 (×8): qty 1

## 2014-11-20 MED ORDER — ACETAMINOPHEN 325 MG PO TABS
650.0000 mg | ORAL_TABLET | Freq: Once | ORAL | Status: AC
  Administered 2014-11-20: 650 mg via ORAL
  Filled 2014-11-20: qty 2

## 2014-11-20 MED ORDER — VANCOMYCIN HCL IN DEXTROSE 1-5 GM/200ML-% IV SOLN
1000.0000 mg | Freq: Once | INTRAVENOUS | Status: AC
  Administered 2014-11-20: 1000 mg via INTRAVENOUS
  Filled 2014-11-20: qty 200

## 2014-11-20 MED ORDER — VANCOMYCIN HCL 10 G IV SOLR
1250.0000 mg | Freq: Three times a day (TID) | INTRAVENOUS | Status: DC
  Administered 2014-11-20 – 2014-11-22 (×6): 1250 mg via INTRAVENOUS
  Filled 2014-11-20 (×9): qty 1250

## 2014-11-20 NOTE — ED Notes (Signed)
Patient taken to CT.

## 2014-11-20 NOTE — Progress Notes (Signed)
ANTIBIOTIC CONSULT NOTE - INITIAL  Pharmacy Consult for Vancomycin/Zosyn Indication: rule out sepsis  Allergies  Allergen Reactions  . Latex Rash    Patient Measurements: Height: 5\' 6"  (167.6 cm) Weight: 295 lb 3.1 oz (133.9 kg) IBW/kg (Calculated) : 59.3  Vital Signs: Temp: 98.3 F (36.8 C) (10/25 0503) Temp Source: Oral (10/25 0503) BP: 103/75 mmHg (10/25 0445) Pulse Rate: 101 (10/25 0445)  Labs:  Recent Labs  11/20/14 0045  WBC 33.4*  HGB 14.2  PLT 381  CREATININE 0.85   Estimated Creatinine Clearance: 142.3 mL/min (by C-G formula based on Cr of 0.85).  Microbiology: Recent Results (from the past 720 hour(s))  Rapid strep screen     Status: Abnormal   Collection Time: 11/20/14 12:45 AM  Result Value Ref Range Status   Streptococcus, Group A Screen (Direct) POSITIVE (A) NEGATIVE Final    Medical History: Past Medical History  Diagnosis Date  . Depression   . Anxiety   . Bipolar 1 disorder (HCC)      Assessment: 24 y/o here with fever/confusion, WBC is markedly elevated at 33.4, lactic acid elevated, renal function good, pt has POSITIVE strep screen, other labs/meds reviewed.   Goal of Therapy:  Vancomycin trough level 15-20 mcg/ml  Plan:  -Vancomycin 1250 mg IV q8h -Zosyn 3.375G IV q8h to be infused over 4 hours -Trend WBC, temp, renal function  -Drug levels as indicated   Abran DukeLedford, Sherril Heyward 11/20/2014,5:29 AM

## 2014-11-20 NOTE — Progress Notes (Signed)
  Patient admitted earlier this morning. H&P reviewed. Patient seen and examined.  S: Patient feels better. Headache is improved. Still has a sore throat. She mentions that she has had at least 10-12 episodes of strep throat over the past many years. She knows that she has swollen tonsils.  O: Vital signs reviewed. Tmax was 101.86F at midnight. Heart rate and blood pressure stable. Neck is soft and supple Enlarged tonsils noted bilaterally with exudates. Mild erythema. Lungs are clear to auscultation bilaterally with no wheezing, rales or rhonchi. S1, S2 is normal, regular. No S3, S4. No rubs, murmurs or bruits Abdomen is soft, nontender, nondistended. Sounds are present. No masses, organomegaly Alert and oriented 3. No focal neurological deficits  Lactic acid is improved to 1.8. WBC is improved to 22.9. UA is a contaminated sample. Based on many squamous epithelial cells.  A/P: This is a 24 year old Caucasian female admitted with fever, headache, sore throat. All of these parameter is have improved. She was diagnosed with strep throat. She has enlarged tonsils. She's had multiple episodes of strep pharyngitis. Continue plan as outlined in the H&P. Continue current treatment for now. Once blood cultures are negative, Antibiotics can be descalated. I have explained to her that she will need to talk to her doctor about being referred to ENT as she will benefit from tonsillectomy considering recurrent episodes of pharyngitis.  Rickard Kennerly 11/20/2014 11:05 AM

## 2014-11-20 NOTE — ED Provider Notes (Signed)
CSN: [id number]     Arrival date & time 11/20/14  0012 History  By signing my name below, I, Freida Busman, attest that this documentation has been prepared under the direction and in the presence of Loren Racer, MD . Electronically Signed: Freida Busman, Scribe. 11/20/2014. 12:45 AM.  Chief Complaint  Patient presents with  . Fever  . Altered Mental Status   LEVEL 5 CAVEAT DUE TO Mental Status  The history is provided by the patient and a relative (Brother). No language interpreter was used.    HPI Comments:  Denise Conrad is a 24 y.o. female who presents to the Emergency Department complaining of HA. Pt is a poor historian at this time. Per pt's brother, ~ 1hr ago he  heard a sound, went to check on pt and found pt groaning in the shower. Her temp at that time was 103. Brother notes episodes of confusion since. He denies seizure like activity and h/o seizures.  Pt denies taking her xanax and klonopin this evening.   Past Medical History  Diagnosis Date  . Depression   . Anxiety   . Bipolar 1 disorder [facility])    Past Surgical History  Procedure Laterality Date  . Wisdom tooth extraction     Family History  Problem Relation Age of Onset  . Hypertension Father   . Heart failure Father     hyptertensive cardiovascular disease   Social History  Substance Use Topics  . Smoking status: Never Smoker   . Smokeless tobacco: None  . Alcohol Use: No   OB History    Gravida Para Term Preterm AB TAB SAB Ectopic Multiple Living   Review of Systems  Unable to perform ROS: Mental status change    Allergies  Latex  Home Medications   Prior to Admission medications   Medication Sig Start Date End Date Taking? Authorizing Provider  cetirizine (ZYRTEC) 10 MG tablet Take 10 mg by mouth daily.   Yes Historical Provider, MD  clonazePAM (KLONOPIN) 0.5 MG tablet Take 0.5 mg by mouth 2 (two) times daily as needed for anxiety.   Yes Historical Provider, MD   DULoxetine (CYMBALTA) 60 MG capsule Take 60 mg by mouth at bedtime.   Yes Historical Provider, MD  fesoterodine (TOVIAZ) 4 MG TB24 Take 4 mg by mouth at bedtime.    Yes Historical Provider, MD  lamoTRIgine (LAMICTAL) 200 MG tablet Take 200 mg by mouth at bedtime.   Yes Historical Provider, MD  lisdexamfetamine (VYVANSE) 50 MG capsule Take 50 mg by mouth daily.   Yes Historical Provider, MD  Norgestim-Eth Estrad Triphasic (ORTHO TRI-CYCLEN, 28, PO) Take by mouth.   Yes Historical Provider, MD   BP 100/59 mmHg  Pulse 119  Temp(Src) 100.1 F (37.8 C) (Oral)  Resp 18  Ht  (1.676 m)  Wt 295 lb 3.1 oz (133.9 kg)  BMI 47.67 kg/m2  SpO2 97%  LMP 11/06/2014 Physical Exam  Constitutional: She appears well-developed and well-nourished. No distress.  Intermittently lethargic  HENT:  Head: Normocephalic and atraumatic.  Mouth/Throat: Oropharyngeal exudate present.  Bilateral tonsillar erythema and exudates present  Eyes: EOM are normal. Pupils are equal, round, and reactive to light.  Neck: Normal range of motion. Neck supple.  No meningismus  Cardiovascular: Regular rhythm.   Tachycardia  Pulmonary/Chest: Effort normal and breath sounds normal. No respiratory distress. She has no wheezes. She has no rales. She  exhibits no tenderness.  Abdominal: Soft. Bowel sounds are normal. She exhibits no distension and no mass. There is no tenderness. There is no rebound and no guarding.  Musculoskeletal: Normal range of motion. She exhibits no edema or tenderness.  No calf swelling or tenderness.  Lymphadenopathy:    She has cervical adenopathy.  Neurological:  Intermittent lethargy. Follow simple commands. Moves all extremities.  Skin: Skin is warm. No rash noted. She is diaphoretic. No erythema.  Nursing note and vitals reviewed.   ED Course  Procedures   DIAGNOSTIC STUDIES:  Oxygen Saturation is 97% on RA, normal by my interpretation.    Labs Review Labs Reviewed  RAPID STREP  SCREEN (NOT AT [facility]) - Abnormal; Notable for the following:    Streptococcus, Group A Screen (Direct) POSITIVE (*)    All other components within normal limits  CBC WITH DIFFERENTIAL/PLATELET - Abnormal; Notable for the following:    WBC 33.4 (*)    Neutro Abs 28.7 (*)    Monocytes Absolute 2.0 (*)    All other components within normal limits  COMPREHENSIVE METABOLIC PANEL - Abnormal; Notable for the following:    Sodium 133 (*)    Chloride 96 (*)    Glucose, Bld 144 (*)    BUN <5 (*)    Albumin 3.2 (*)    All other components within normal limits  URINALYSIS, ROUTINE W REFLEX MICROSCOPIC (NOT AT [facility]) - Abnormal; Notable for the following:    APPearance CLOUDY (*)    Leukocytes, UA MODERATE (*)    All other components within normal limits  URINE RAPID DRUG SCREEN, HOSP PERFORMED - Abnormal; Notable for the following:    Opiates POSITIVE (*)    Amphetamines POSITIVE (*)    All other components within normal limits  URINE MICROSCOPIC-ADD ON - Abnormal; Notable for the following:    Squamous Epithelial / LPF MANY (*)    Bacteria, UA FEW (*)    All other components within normal limits  CBC - Abnormal; Notable for the following:    WBC 22.9 (*)    All other components within normal limits  BASIC METABOLIC PANEL - Abnormal; Notable for the following:    Sodium 134 (*)    Glucose, Bld 107 (*)    BUN <5 (*)    Calcium 7.7 (*)    All other components within normal limits  GLUCOSE, CAPILLARY - Abnormal; Notable for the following:    Glucose-Capillary 135 (*)    All other components within normal limits  CBG MONITORING, ED - Abnormal; Notable for the following:    Glucose-Capillary 159 (*)    All other components within normal limits  I-STAT CG4 LACTIC ACID, ED - Abnormal; Notable for the following:    Lactic Acid, Venous 3.12 (*)    All other components within normal limits  CULTURE, BLOOD (ROUTINE X 2)  CULTURE, BLOOD (ROUTINE X 2)  LACTIC ACID, PLASMA  LACTIC ACID, PLASMA   CBC  COMPREHENSIVE METABOLIC PANEL  I-STAT BETA HCG BLOOD, ED ([facility], [facility], [facility] ONLY)    Imaging Review Ct Head Wo Contrast  11/20/2014  CLINICAL DATA:  Acute onset of fever and severe lethargy. Headache and sore throat. Loss of consciousness. Initial encounter. EXAM: CT HEAD WITHOUT CONTRAST TECHNIQUE: Contiguous axial images were obtained from the base of the skull through the vertex without intravenous contrast. COMPARISON:  None. FINDINGS: There is no evidence of acute infarction, mass lesion, or intra- or extra-axial hemorrhage on CT. The posterior fossa,  including the cerebellum, brainstem and fourth ventricle, is within normal limits. The third and lateral ventricles, and basal ganglia are unremarkable in appearance. The cerebral hemispheres are symmetric in appearance, with normal gray-white differentiation. No mass effect or midline shift is seen. There is no evidence of fracture; visualized osseous structures are unremarkable in appearance. The visualized portions of the orbits are within normal limits. The paranasal sinuses and mastoid air cells are well-aerated. No significant soft tissue abnormalities are seen. IMPRESSION: Unremarkable noncontrast CT of the head. Electronically Signed   By: Roanna Raider M.D.   On: 11/20/2014 01:38   Dg Chest Port 1 View  11/20/2014  CLINICAL DATA:  24 year old female with fever and headache. EXAM: PORTABLE CHEST 1 VIEW COMPARISON:  [facility]. FINDINGS: Lung volumes are low. No consolidative airspace disease. No pleural effusions. No pneumothorax. No pulmonary nodule or mass noted. Pulmonary vasculature and the cardiomediastinal silhouette are within normal limits. IMPRESSION: 1. Low lung volumes without radiographic evidence of acute cardiopulmonary disease. Electronically Signed   By: Trudie Reed M.D.   On: 11/20/2014 01:18   I have personally reviewed and evaluated these images and lab results as part of my medical decision-making.   EKG  Interpretation None      MDM   Final diagnoses:  None    I personally performed the services described in this documentation, which was scribed in my presence. The recorded information has been reviewed and is accurate.   Shortly after initial evaluation patient became much more alert. Normal neurologic exam. Mentation abdomen meningismus. Lactic acid elevated as well as white blood cell count. Patient meets criteria for sepsis. Likely source is strep pharyngitis. Broad coverage initiated as well as IV fluids.  Patient continues to have a normal mental status. She is on multiple sedating medications. This may contribute to her initial lethargy. I have very low suspicion for infectious encephalitis or meningitis. Patient has a source for a fever which is a tonsillitis. She'll given broad coverage antibiotics and IV fluids. Heart rate has improved as well elevated temperature. Discussed with [facility] hospitalist. Will admit to telemetry bed.  Loren Racer, MD 11/20/14 2312

## 2014-11-20 NOTE — ED Notes (Signed)
Pt brought to ED by her family member d/t fever and LOC that occurred this evening - pt is lethargic in triage and a poor historian - pt admits to HA and sore throat.

## 2014-11-20 NOTE — ED Notes (Signed)
Attempted report x1. 

## 2014-11-20 NOTE — ED Notes (Signed)
Patient returned from CT

## 2014-11-20 NOTE — Progress Notes (Signed)
Utilization review completed. Nadav Swindell, RN, BSN. 

## 2014-11-20 NOTE — Progress Notes (Signed)
Pt HR up to 140's when up to bathroom.  Pt asymptomatic, MD paged.

## 2014-11-20 NOTE — H&P (Signed)
History and Physical  Patient Name: Denise GrizzleSamantha L Meinhart     ZOX:[id number]    DOB: 08/02/1990    DOA: 11/20/2014 Referring physician: Loren Raceravid Yelverton, MD PCP: Tomi BambergerFULLER,SUSAN, NP      Chief Complaint: Fever and weakness  HPI: Denise Conrad is a 24 y.o. female with a past medical history significant for bipolar disorder who presents with fever and weakness.  The patient was in her usual state of health until the past few days when she noted sore throat and ear pain. Today the patient had a fever to 103F and had myalgias and malaise. In the evening she was taking a shower, when she felt weak, collapsed, and was groaning. Her brother who lives in the same house, found her and describes her as "out of it" and somnolent and brought her to the ER.  In the ED, the patient was initially somnolent, febrile, tachycardic, and with leukocytosis, positive rapid strep testing and elevated lactic acid.  A CT head was normal, CXR was normal, and UA showed contamination.  She received IV fluids and became alert, oriented and conversational, and so LP was deferred.     Review of Systems:  Pt complains of weakness, fever, flushing, sweating, myalgias, malaise, headache, ear pain bilaterally, cough, sore throat. Pt denies any emesis, diarrhea, dysuria, dyspnea, confusion, seizures.  All other systems negative except as just noted or noted in the history of present illness.  Allergies  Allergen Reactions  . Latex Rash    Prior to Admission medications   Medication Sig Start Date End Date Taking? Authorizing Provider  ALPRAZolam Prudy Feeler(XANAX) 0.5 MG tablet Take 0.5 mg by mouth 3 (three) times daily as needed for anxiety (anxiety). For anxiety    Historical Provider, MD  clonazePAM (KLONOPIN) 0.5 MG tablet Take 0.5 mg by mouth 2 (two) times daily as needed for anxiety.    Historical Provider, MD  cyclobenzaprine (FLEXERIL) 10 MG tablet Take 10 mg by mouth at bedtime. To prevent nocturnal jaw clenching     Historical Provider, MD  DULoxetine (CYMBALTA) 60 MG capsule Take 60 mg by mouth at bedtime.    Historical Provider, MD  fesoterodine (TOVIAZ) 4 MG TB24 Take 4 mg by mouth at bedtime.     Historical Provider, MD  lamoTRIgine (LAMICTAL) 200 MG tablet Take 200 mg by mouth at bedtime.    Historical Provider, MD  lisdexamfetamine (VYVANSE) 50 MG capsule Take 50 mg by mouth daily.    Historical Provider, MD  Norgestim-Eth Estrad Triphasic (ORTHO TRI-CYCLEN, 28, PO) Take by mouth.    Historical Provider, MD    Past Medical History  Diagnosis Date  . Depression   . Anxiety   . Bipolar 1 disorder [facility](HCC)     Past Surgical History  Procedure Laterality Date  . Wisdom tooth extraction      Family history: family history includes Heart failure in her father; Hypertension in her father. The patient describes sudden death in her father, possibly from HOCM.  Family members "were tested" and do not have the same problem.  Social History: Patient lives with her son and brother. She does not work. She does not smoke. She does not use alcohol.       Physical Exam: BP 103/69 mmHg  Pulse 106  Temp(Src) 98.3 F (36.8 C) (Oral)  Resp 16  SpO2 96%  LMP 11/06/2014 General appearance: Obese adult female, alert and in mild distress from fever.   Eyes: Anicteric, conjunctiva pink, lids and lashes normal.  ENT: No nasal deformity, discharge, or epistaxis.  OP moist.  There is erythema and whitish discharge on the tonsils, which are swollen bilaterally.  There is no mass effect and the uvula is midline.  There is no neck mass or trismus. Lymph: Moderate bilateral anterior tender cervical lymphadenopathy.  No supraclavicular or axillary lymphadenopathy. Skin: Warm and flushed and red.  No suspicious rashes. Cardiac: Tachycardic, regular, nl S1-S2, no murmurs appreciated.  Capillary refill is brisk.  JVP not visible.  No LE edema.  Radial and DP pulses 2+ and symmetric. Respiratory: Normal respiratory  rate and rhythm.  CTAB without rales or wheezes. Abdomen: Abdomen soft without rigidity.  No TTP. No ascites, distension.   MSK: No deformities or effusions. Neuro: Sensorium intact and responding to questions, attention normal.  Speech is fluent.  Moves all extremities equally and with normal coordination.    Psych: Behavior appropriate.  Affect normal.  No evidence of aural or visual hallucinations or delusions.       Labs on Admission:  The metabolic panel shows hyponatremia. The transaminases and bilirubin are normal. The albumin is mildly low at 3.2 g/dL. The complete blood count shows leukocytosis to 30 3.4K/ul without anemia or thrombocytopenia. Rapid strep test is positive. Lactic acid is 3.12 mmol per liter   Radiological Exams on Admission: Ct Head Wo Contrast 11/20/2014 IMPRESSION: Unremarkable noncontrast CT of the head.    Personally reviewed: Dg Chest Port 1 View 11/20/2014  No focal opacities.        Assessment/Plan  1. Sepsis:  This is new.   Suspected source GAS pharyngitis vs UTI vs bacteremia. Organism unknown.  Patient meets SIRS criteria given tachycardia, fever, leukocytosis, with evidence of hypoperfusion.  Lactate exceeds 2 mmol/L and repeat ordered within 6 hours.   -Antibiotics delivered in the ED.   -Will continue vancomycin and piperacillin-tazobactam given that the patient's degree of hemodynamic response seems out of proportion to Group A strep pharyngitis -Blood and urine cultures drawn.  -At the time of my evaluation, the patient does not have signs of hemodynamic compromise or end-organ dysfunction and is stable for a medical-surgical bed.    2. Bipolar disorder:  Stable. - Continue home lamotrigine, duloxetine, and clonazepam  3. Chronic urinary frequency:  Stable. - Continue home Toviaz    DVT PPx: Lovenox Diet: Regular Code Status: Full Family Communication: The patient's differential diagnosis, workup and treatment plan  were discussed with her brother at the bedside.  All questions were answered.  Medical decision making: What exists of the patient's previous chart was reviewed in depth and the case was discussed with Dr. Ranae Palms. Patient seen 3:53 AM on 11/20/2014.  Disposition Plan:  IV antibiotics.  Trend lactic acid.  Follow up cultures.  De-escalate antibiotics based on results of cultures and discharge to home in 2-3 days.         Alberteen Sam [facility] Pager [phone]

## 2014-11-21 DIAGNOSIS — A409 Streptococcal sepsis, unspecified: Secondary | ICD-10-CM

## 2014-11-21 DIAGNOSIS — F419 Anxiety disorder, unspecified: Secondary | ICD-10-CM

## 2014-11-21 DIAGNOSIS — J020 Streptococcal pharyngitis: Secondary | ICD-10-CM

## 2014-11-21 DIAGNOSIS — F319 Bipolar disorder, unspecified: Secondary | ICD-10-CM

## 2014-11-21 LAB — COMPREHENSIVE METABOLIC PANEL
ALK PHOS: 79 U/L (ref 38–126)
ALT: 17 U/L (ref 14–54)
AST: 23 U/L (ref 15–41)
Albumin: 2.5 g/dL — ABNORMAL LOW (ref 3.5–5.0)
Anion gap: 9 (ref 5–15)
BUN: <5 mg/dL — ABNORMAL LOW (ref 6–20)
CALCIUM: 8 mg/dL — AB (ref 8.9–10.3)
CHLORIDE: 102 mmol/L (ref 101–111)
CO2: 25 mmol/L (ref 22–32)
CREATININE: 1.06 mg/dL — AB (ref 0.44–1.00)
Glucose, Bld: 105 mg/dL — ABNORMAL HIGH (ref 65–99)
Potassium: 3.3 mmol/L — ABNORMAL LOW (ref 3.5–5.1)
Sodium: 136 mmol/L (ref 135–145)
Total Bilirubin: 0.4 mg/dL (ref 0.3–1.2)
Total Protein: 7.1 g/dL (ref 6.5–8.1)

## 2014-11-21 LAB — CBC
HCT: 34.9 % — ABNORMAL LOW (ref 36.0–46.0)
Hemoglobin: 11.3 g/dL — ABNORMAL LOW (ref 12.0–15.0)
MCH: 27.8 pg (ref 26.0–34.0)
MCHC: 32.4 g/dL (ref 30.0–36.0)
MCV: 86 fL (ref 78.0–100.0)
PLATELETS: 328 10*3/uL (ref 150–400)
RBC: 4.06 MIL/uL (ref 3.87–5.11)
RDW: 14.2 % (ref 11.5–15.5)
WBC: 22.9 10*3/uL — AB (ref 4.0–10.5)

## 2014-11-21 MED ORDER — SALINE SPRAY 0.65 % NA SOLN
1.0000 | NASAL | Status: DC | PRN
  Filled 2014-11-21: qty 44

## 2014-11-21 NOTE — Progress Notes (Signed)
Pt stated took PM meds from home. Instructed to sent meds home and not to take meds from home. Pt understood instructions and agreed.

## 2014-11-21 NOTE — Progress Notes (Signed)
Pt hr goes up to 140's when ambulating then goes down to lower 110. Pt asymptomatic. No c/o palpitations nor chest pains.will continue to monitor.

## 2014-11-21 NOTE — Progress Notes (Signed)
[facility] PROGRESS NOTE   Denise GrizzleSamantha L Conrad UJW:[id number] DOB: 12/14/1990 DOA: 11/20/2014 PCP: Tomi BambergerFULLER,SUSAN, NP  HPI/Subjective: Feels much better, denies any fever or chills since this morning.  Assessment/Plan: Principal Problem:   Strep pharyngitis Active Problems:   Bipolar 1 disorder (HCC)   Anxiety   Sepsis (HCC)   Sepsis Presented to the hospital with fever of 101.4, WBC of 33.4 with presence of suppurative tonsillitis/UTI. Started on broad-spectrum antibiotics. Aggressive hydration with IV fluids. Urine and blood cultures obtained.  Suppurative tonsillitis Patient has purulent drainage on both tonsils, right more than left. Positive for Streptococcus screen. Patient is on Zosyn and vancomycin.  UTI Urinalysis consistent with UTI, patient tensioned she has chronic urinary frequency. Anyway she is on Zosyn, await urine culture.  Bipolar disorder and anxiety Patient is on lamotrigine, duloxetine and clonazepam, continued.  Code Status: Full Code Family Communication: Plan discussed with the patient. Disposition Plan: Remains inpatient Diet: Diet regular Room service appropriate?: Yes; Fluid consistency:: Thin  Consultants:  None  Procedures:  None  Antibiotics:  Zosyn and vancomycin   Objective: Filed Vitals:   11/21/14 0606  BP: 105/62  Pulse: 103  Temp: 98.2 F (36.8 C)  Resp: 18    Intake/Output Summary (Last 24 hours) at 11/21/14 1147 Last data filed at 11/21/14 0928  Gross per 24 hour  Intake   1490 ml  Output    400 ml  Net   1090 ml   Filed Weights   11/20/14 0516 11/21/14 0606  Weight: 133.9 kg (295 lb 3.1 oz) 133.584 kg (294 lb 8 oz)    Exam: General: Alert and awake, oriented x3, not in any acute distress. HEENT: anicteric sclera, pupils reactive to light and accommodation, EOMI CVS: S1-S2 clear, no murmur rubs or gallops Chest: clear to auscultation bilaterally, no wheezing, rales or rhonchi Abdomen: soft  nontender, nondistended, normal bowel sounds, no organomegaly Extremities: no cyanosis, clubbing or edema noted bilaterally Neuro: Cranial nerves II-XII intact, no focal neurological deficits  Data Reviewed: Basic Metabolic Panel:  Recent Labs Lab 11/20/14 0045 11/20/14 0740 11/21/14 0222  NA 133* 134* 136  K 3.6 3.6 3.3*  CL 96* 101 102  CO2 23 25 25   GLUCOSE 144* 107* 105*  BUN <5* <5* <5*  CREATININE 0.85 0.79 1.06*  CALCIUM 9.3 7.7* 8.0*   Liver Function Tests:  Recent Labs Lab 11/20/14 0045 11/21/14 0222  AST 32 23  ALT 23 17  ALKPHOS 65 79  BILITOT 0.5 0.4  PROT 7.4 7.1  ALBUMIN 3.2* 2.5*   No results for input(s): LIPASE, AMYLASE in the last 168 hours. No results for input(s): AMMONIA in the last 168 hours. CBC:  Recent Labs Lab 11/20/14 0045 11/20/14 0740 11/21/14 0222  WBC 33.4* 22.9* 22.9*  NEUTROABS 28.7*  --   --   HGB 14.2 12.1 11.3*  HCT 41.4 36.8 34.9*  MCV 83.5 84.0 86.0  PLT 381 311 328   Cardiac Enzymes: No results for input(s): CKTOTAL, CKMB, CKMBINDEX, TROPONINI in the last 168 hours. BNP (last 3 results) No results for input(s): BNP in the last 8760 hours.  ProBNP (last 3 results) No results for input(s): PROBNP in the last 8760 hours.  CBG:  Recent Labs Lab 11/20/14 0037 11/20/14 1610  GLUCAP 159* 135*    Micro Recent Results (from the past 240 hour(s))  Rapid strep screen     Status: Abnormal   Collection Time: 11/20/14 12:45 AM  Result Value Ref Range Status  Streptococcus, Group A Screen (Direct) POSITIVE (A) NEGATIVE Final     Studies: Ct Head Wo Contrast  11/20/2014  CLINICAL DATA:  Acute onset of fever and severe lethargy. Headache and sore throat. Loss of consciousness. Initial encounter. EXAM: CT HEAD WITHOUT CONTRAST TECHNIQUE: Contiguous axial images were obtained from the base of the skull through the vertex without intravenous contrast. COMPARISON:  None. FINDINGS: There is no evidence of acute  infarction, mass lesion, or intra- or extra-axial hemorrhage on CT. The posterior fossa, including the cerebellum, brainstem and fourth ventricle, is within normal limits. The third and lateral ventricles, and basal ganglia are unremarkable in appearance. The cerebral hemispheres are symmetric in appearance, with normal gray-white differentiation. No mass effect or midline shift is seen. There is no evidence of fracture; visualized osseous structures are unremarkable in appearance. The visualized portions of the orbits are within normal limits. The paranasal sinuses and mastoid air cells are well-aerated. No significant soft tissue abnormalities are seen. IMPRESSION: Unremarkable noncontrast CT of the head. Electronically Signed   By: Roanna Raider M.D.   On: 11/20/2014 01:38   Dg Chest Port 1 View  11/20/2014  CLINICAL DATA:  24 year old female with fever and headache. EXAM: PORTABLE CHEST 1 VIEW COMPARISON:  [facility]. FINDINGS: Lung volumes are low. No consolidative airspace disease. No pleural effusions. No pneumothorax. No pulmonary nodule or mass noted. Pulmonary vasculature and the cardiomediastinal silhouette are within normal limits. IMPRESSION: 1. Low lung volumes without radiographic evidence of acute cardiopulmonary disease. Electronically Signed   By: Trudie Reed M.D.   On: 11/20/2014 01:18    Scheduled Meds: . DULoxetine  60 mg Oral QHS  . enoxaparin (LOVENOX) injection  65 mg Subcutaneous Q24H  . fesoterodine  4 mg Oral QHS  . lamoTRIgine  200 mg Oral QHS  . piperacillin-tazobactam (ZOSYN)  IV  3.375 g Intravenous 3 times per day  . sodium chloride  3 mL Intravenous Q12H  . vancomycin  1,250 mg Intravenous Q8H   Continuous Infusions: . sodium chloride 100 mL/hr at 11/20/14 0733       Time spent: 35 minutes    [facility] A  [facility] Pager [phone] If 7PM-7AM, please contact night-coverage at [email or web address], password [employer] 11/21/2014, 11:47 AM  LOS: 1 day

## 2014-11-22 ENCOUNTER — Inpatient Hospital Stay (HOSPITAL_COMMUNITY): Payer: Medicaid Other

## 2014-11-22 DIAGNOSIS — J039 Acute tonsillitis, unspecified: Secondary | ICD-10-CM

## 2014-11-22 DIAGNOSIS — N179 Acute kidney failure, unspecified: Secondary | ICD-10-CM

## 2014-11-22 DIAGNOSIS — N170 Acute kidney failure with tubular necrosis: Secondary | ICD-10-CM

## 2014-11-22 HISTORY — DX: Acute kidney failure, unspecified: N17.9

## 2014-11-22 LAB — CBC
HCT: 35.9 % — ABNORMAL LOW (ref 36.0–46.0)
Hemoglobin: 11.8 g/dL — ABNORMAL LOW (ref 12.0–15.0)
MCH: 28 pg (ref 26.0–34.0)
MCHC: 32.9 g/dL (ref 30.0–36.0)
MCV: 85.1 fL (ref 78.0–100.0)
Platelets: 352 10*3/uL (ref 150–400)
RBC: 4.22 MIL/uL (ref 3.87–5.11)
RDW: 14.2 % (ref 11.5–15.5)
WBC: 18.8 10*3/uL — ABNORMAL HIGH (ref 4.0–10.5)

## 2014-11-22 LAB — URINALYSIS, ROUTINE W REFLEX MICROSCOPIC
BILIRUBIN URINE: NEGATIVE
Glucose, UA: NEGATIVE mg/dL
Hgb urine dipstick: NEGATIVE
KETONES UR: NEGATIVE mg/dL
LEUKOCYTES UA: NEGATIVE
NITRITE: NEGATIVE
PROTEIN: NEGATIVE mg/dL
Specific Gravity, Urine: 1.007 (ref 1.005–1.030)
UROBILINOGEN UA: 0.2 mg/dL (ref 0.0–1.0)
pH: 6 (ref 5.0–8.0)

## 2014-11-22 LAB — BASIC METABOLIC PANEL
ANION GAP: 9 (ref 5–15)
BUN: 8 mg/dL (ref 6–20)
CHLORIDE: 106 mmol/L (ref 101–111)
CO2: 24 mmol/L (ref 22–32)
Calcium: 8.2 mg/dL — ABNORMAL LOW (ref 8.9–10.3)
Creatinine, Ser: 2.41 mg/dL — ABNORMAL HIGH (ref 0.44–1.00)
GFR calc Af Amer: 31 mL/min — ABNORMAL LOW (ref >60)
GFR, EST NON AFRICAN AMERICAN: 27 mL/min — AB (ref >60)
Glucose, Bld: 108 mg/dL — ABNORMAL HIGH (ref 65–99)
POTASSIUM: 3.4 mmol/L — AB (ref 3.5–5.1)
SODIUM: 139 mmol/L (ref 135–145)

## 2014-11-22 LAB — SODIUM, URINE, RANDOM: Sodium, Ur: 64 mmol/L

## 2014-11-22 LAB — VANCOMYCIN, RANDOM: Vancomycin Rm: 76 ug/mL

## 2014-11-22 LAB — CREATININE, URINE, RANDOM: CREATININE, URINE: 39.32 mg/dL

## 2014-11-22 MED ORDER — FAMOTIDINE 20 MG PO TABS
20.0000 mg | ORAL_TABLET | Freq: Every day | ORAL | Status: DC
  Administered 2014-11-22 – 2014-11-24 (×3): 20 mg via ORAL
  Filled 2014-11-22 (×3): qty 1

## 2014-11-22 MED ORDER — ENOXAPARIN SODIUM 40 MG/0.4ML ~~LOC~~ SOLN
40.0000 mg | SUBCUTANEOUS | Status: DC
  Filled 2014-11-22: qty 0.4

## 2014-11-22 MED ORDER — CALCIUM CARBONATE ANTACID 500 MG PO CHEW
1.0000 | CHEWABLE_TABLET | Freq: Three times a day (TID) | ORAL | Status: DC
  Administered 2014-11-22: 200 mg via ORAL
  Filled 2014-11-22: qty 1

## 2014-11-22 MED ORDER — POTASSIUM CHLORIDE CRYS ER 20 MEQ PO TBCR
60.0000 meq | EXTENDED_RELEASE_TABLET | Freq: Once | ORAL | Status: AC
  Administered 2014-11-22: 60 meq via ORAL
  Filled 2014-11-22: qty 3

## 2014-11-22 MED ORDER — ENOXAPARIN SODIUM 30 MG/0.3ML ~~LOC~~ SOLN: 30.0000 mg | SUBCUTANEOUS | Status: DC

## 2014-11-22 NOTE — Progress Notes (Signed)
[facility] PROGRESS NOTE   Denise GrizzleSamantha L Conrad WUJ:[id number] DOB: 01/14/1991 DOA: 11/20/2014 PCP: Tomi BambergerFULLER,SUSAN, NP  HPI/Subjective: Feels much better, denies any fever or chills since this morning.  Assessment/Plan: Principal Problem:   Acute suppurative tonsillitis Active Problems:   Bipolar 1 disorder (HCC)   Anxiety   Sepsis (HCC)   Acute renal failure (HCC)   Sepsis Presented to the hospital with fever of 101.4, WBC of 33.4 with presence of suppurative tonsillitis/UTI. Started on broad-spectrum antibiotics. Aggressive hydration with IV fluids. Urine and blood cultures obtained.  Suppurative tonsillitis Patient has purulent drainage on both tonsils, right more than left. Positive for Streptococcus screen. Patient is on Zosyn and vancomycin, vancomycin discontinued. Still has significant leukocytosis, check CT of neck to rule out peritonsillar abscess  Acute renal failure Baseline creatinine is 0.7 on admission, creatinine increased today to 2.41 reviewed Patient only made about 600 mL of urine yesterday (if all urine output charted), increase IV fluids. FENa is 2.8 consistent with intrinsic renal disease, patient did not receive contrast. On vancomycin, random level is 76 drawn at 8:10 AM, last vancomycin dose is 1.25 g given at 2:24 AM on 10/27. Get renal ultrasound, check BMP in a.m. if not improving might involve nephrology. No pyuria or casts doubt AIN.  UTI Urinalysis consistent with UTI, patient reported she has chronic urinary frequency. Anyway she is on Zosyn, repeat urinalysis showed no evidence of pyuria.  Bipolar disorder and anxiety Patient is on lamotrigine, duloxetine and clonazepam, continued.  Code Status: Full Code Family Communication: Plan discussed with the patient. Disposition Plan: Remains inpatient Diet: Diet regular Room service appropriate?: Yes; Fluid consistency::  Thin  Consultants:  None  Procedures:  None  Antibiotics:  Zosyn and vancomycin   Objective: Filed Vitals:   11/22/14 0556  BP: 136/83  Pulse: 115  Temp: 98.6 F (37 C)  Resp: 22    Intake/Output Summary (Last 24 hours) at 11/22/14 1145 Last data filed at 11/22/14 1039  Gross per 24 hour  Intake   2275 ml  Output   1050 ml  Net   1225 ml   Filed Weights   11/20/14 0516 11/21/14 0606 11/22/14 0556  Weight: 133.9 kg (295 lb 3.1 oz) 133.584 kg (294 lb 8 oz) 134.673 kg (296 lb 14.4 oz)    Exam: General: Alert and awake, oriented x3, not in any acute distress. HEENT: anicteric sclera, pupils reactive to light and accommodation, EOMI CVS: S1-S2 clear, no murmur rubs or gallops Chest: clear to auscultation bilaterally, no wheezing, rales or rhonchi Abdomen: soft nontender, nondistended, normal bowel sounds, no organomegaly Extremities: no cyanosis, clubbing or edema noted bilaterally Neuro: Cranial nerves II-XII intact, no focal neurological deficits  Data Reviewed: Basic Metabolic Panel:  Recent Labs Lab 11/20/14 0045 11/20/14 0740 11/21/14 0222 11/22/14 0244  NA 133* 134* 136 139  K 3.6 3.6 3.3* 3.4*  CL 96* 101 102 106  CO2 23 25 25 24   GLUCOSE 144* 107* 105* 108*  BUN <5* <5* <5* 8  CREATININE 0.85 0.79 1.06* 2.41*  CALCIUM 9.3 7.7* 8.0* 8.2*   Liver Function Tests:  Recent Labs Lab 11/20/14 0045 11/21/14 0222  AST 32 23  ALT 23 17  ALKPHOS 65 79  BILITOT 0.5 0.4  PROT 7.4 7.1  ALBUMIN 3.2* 2.5*   No results for input(s): LIPASE, AMYLASE in the last 168 hours. No results for input(s): AMMONIA in the last 168 hours. CBC:  Recent Labs Lab 11/20/14 0045 11/20/14 0740 11/21/14 0222  11/22/14 0244  WBC 33.4* 22.9* 22.9* 18.8*  NEUTROABS 28.7*  --   --   --   HGB 14.2 12.1 11.3* 11.8*  HCT 41.4 36.8 34.9* 35.9*  MCV 83.5 84.0 86.0 85.1  PLT 381 311 328 352   Cardiac Enzymes: No results for input(s): CKTOTAL, CKMB, CKMBINDEX,  TROPONINI in the last 168 hours. BNP (last 3 results) No results for input(s): BNP in the last 8760 hours.  ProBNP (last 3 results) No results for input(s): PROBNP in the last 8760 hours.  CBG:  Recent Labs Lab 11/20/14 0037 11/20/14 1610  GLUCAP 159* 135*    Micro Recent Results (from the past 240 hour(s))  Culture, blood (routine x 2)     Status: None (Preliminary result)   Collection Time: 11/20/14 12:45 AM  Result Value Ref Range Status   Specimen Description BLOOD RIGHT HAND  Final   Special Requests BOTTLES DRAWN AEROBIC AND ANAEROBIC 5CC  Final   Culture NO GROWTH 2 DAYS  Final   Report Status PENDING  Incomplete  Rapid strep screen     Status: Abnormal   Collection Time: 11/20/14 12:45 AM  Result Value Ref Range Status   Streptococcus, Group A Screen (Direct) POSITIVE (A) NEGATIVE Final  Culture, blood (routine x 2)     Status: None (Preliminary result)   Collection Time: 11/20/14 12:46 AM  Result Value Ref Range Status   Specimen Description BLOOD LEFT HAND  Final   Special Requests BOTTLES DRAWN AEROBIC AND ANAEROBIC 5CC  Final   Culture NO GROWTH 2 DAYS  Final   Report Status PENDING  Incomplete     Studies: No results found.  Scheduled Meds: . DULoxetine  60 mg Oral QHS  . enoxaparin (LOVENOX) injection  40 mg Subcutaneous Q24H  . fesoterodine  4 mg Oral QHS  . lamoTRIgine  200 mg Oral QHS  . piperacillin-tazobactam (ZOSYN)  IV  3.375 g Intravenous 3 times per day  . sodium chloride  3 mL Intravenous Q12H   Continuous Infusions: . sodium chloride 125 mL/hr (11/22/14 0829)       Time spent: 35 minutes    [facility] A  [facility] Pager [phone] If 7PM-7AM, please contact night-coverage at [email or web address], password [facility] 11/22/2014, 11:45 AM  LOS: 2 days

## 2014-11-23 LAB — CBC WITH DIFFERENTIAL/PLATELET
Basophils Absolute: 0 10*3/uL (ref 0.0–0.1)
Basophils Relative: 0 %
EOS PCT: 1 %
Eosinophils Absolute: 0.1 10*3/uL (ref 0.0–0.7)
HEMATOCRIT: 34.2 % — AB (ref 36.0–46.0)
Hemoglobin: 11.1 g/dL — ABNORMAL LOW (ref 12.0–15.0)
LYMPHS ABS: 2.1 10*3/uL (ref 0.7–4.0)
LYMPHS PCT: 13 %
MCH: 27.2 pg (ref 26.0–34.0)
MCHC: 32.5 g/dL (ref 30.0–36.0)
MCV: 83.8 fL (ref 78.0–100.0)
MONO ABS: 1.1 10*3/uL — AB (ref 0.1–1.0)
Monocytes Relative: 6 %
NEUTROS ABS: 13.4 10*3/uL — AB (ref 1.7–7.7)
Neutrophils Relative %: 80 %
PLATELETS: 398 10*3/uL (ref 150–400)
RBC: 4.08 MIL/uL (ref 3.87–5.11)
RDW: 14 % (ref 11.5–15.5)
WBC: 16.7 10*3/uL — AB (ref 4.0–10.5)

## 2014-11-23 LAB — URINALYSIS, ROUTINE W REFLEX MICROSCOPIC
Bilirubin Urine: NEGATIVE
GLUCOSE, UA: NEGATIVE mg/dL
Hgb urine dipstick: NEGATIVE
KETONES UR: NEGATIVE mg/dL
LEUKOCYTES UA: NEGATIVE
NITRITE: NEGATIVE
PROTEIN: NEGATIVE mg/dL
Specific Gravity, Urine: 1.008 (ref 1.005–1.030)
UROBILINOGEN UA: 0.2 mg/dL (ref 0.0–1.0)
pH: 5.5 (ref 5.0–8.0)

## 2014-11-23 LAB — RENAL FUNCTION PANEL
Albumin: 2.3 g/dL — ABNORMAL LOW (ref 3.5–5.0)
Anion gap: 9 (ref 5–15)
BUN: 9 mg/dL (ref 6–20)
CHLORIDE: 105 mmol/L (ref 101–111)
CO2: 23 mmol/L (ref 22–32)
CREATININE: 3.15 mg/dL — AB (ref 0.44–1.00)
Calcium: 8.3 mg/dL — ABNORMAL LOW (ref 8.9–10.3)
GFR calc non Af Amer: 19 mL/min — ABNORMAL LOW (ref >60)
GFR, EST AFRICAN AMERICAN: 22 mL/min — AB (ref >60)
Glucose, Bld: 111 mg/dL — ABNORMAL HIGH (ref 65–99)
POTASSIUM: 3.6 mmol/L (ref 3.5–5.1)
Phosphorus: 4.1 mg/dL (ref 2.5–4.6)
Sodium: 137 mmol/L (ref 135–145)

## 2014-11-23 LAB — VANCOMYCIN, RANDOM: VANCOMYCIN RM: 54 ug/mL

## 2014-11-23 MED ORDER — AMOXICILLIN-POT CLAVULANATE 500-125 MG PO TABS
1.0000 | ORAL_TABLET | Freq: Two times a day (BID) | ORAL | Status: DC
  Administered 2014-11-23 – 2014-11-25 (×5): 500 mg via ORAL
  Filled 2014-11-23 (×7): qty 1

## 2014-11-23 MED ORDER — FUROSEMIDE 80 MG PO TABS
80.0000 mg | ORAL_TABLET | Freq: Two times a day (BID) | ORAL | Status: DC
  Administered 2014-11-23 – 2014-11-24 (×2): 80 mg via ORAL
  Filled 2014-11-23 (×2): qty 1

## 2014-11-23 MED ORDER — AMOXICILLIN-POT CLAVULANATE 500-125 MG PO TABS: 1.0000 | ORAL_TABLET | Freq: Two times a day (BID) | ORAL | Status: DC

## 2014-11-23 MED ORDER — CALCIUM CARBONATE ANTACID 500 MG PO CHEW
1.0000 | CHEWABLE_TABLET | Freq: Three times a day (TID) | ORAL | Status: DC
  Administered 2014-11-23 – 2014-11-24 (×5): 200 mg via ORAL
  Filled 2014-11-23 (×5): qty 1

## 2014-11-23 NOTE — Consult Note (Signed)
Denise Conrad is an 24 y.o. female referred by Dr Arthor CaptainElmahi   Chief Complaint: ARF HPI: 24yo WF admitted 11/20/14 for AMS, sore throat and fever.  Found to strep throat though initially treated with vanco/zosyn and now on augmentin.  Vanco level 74 on 10/27/ and 54 on 10/28.   Given 1 dose ketorlac on 10/25 and a dose of ibuprofen on 11/21/14. She also had taken 1-2 or more ibuprofen PTA. Has not received contrast. Renal US unremarkable.  Has had some lowish BP 90's- low 100's.  Markedly + on fluids by 15L if records are correct.  Scr on admission .85, 10/26 1.06, 10/27 2.8 and today 3.1.  She says she was tx for sore throat with amoxicillin last week and was feeling better over the weekend but then became acutely ill, febrile and delirious on Mon.    Says she was "bleeding from her kidney" during pregnancy 6 yrs ago but does not know details and never told she had renal insufficiency.  No FH renal ds.  She is feeling much better since admission and currently denies any systemic sxs.  Past Medical History  Diagnosis Date  . Depression   . Anxiety   . Bipolar 1 disorder [facility](HCC)     Past Surgical History  Procedure Laterality Date  . Wisdom tooth extraction      Family History  Problem Relation Age of Onset  . Hypertension Father   . Heart failure Father     hyptertensive cardiovascular disease   Social History:  reports that she has never smoked. She does not have any smokeless tobacco history on file. She reports that she does not drink alcohol or use illicit drugs.  Allergies:  Allergies  Allergen Reactions  . Latex Rash    Medications Prior to Admission  Medication Sig Dispense Refill  . cetirizine (ZYRTEC) 10 MG tablet Take 10 mg by mouth daily.    . clonazePAM (KLONOPIN) 0.5 MG tablet Take 0.5 mg by mouth 2 (two) times daily as needed for anxiety.    . DULoxetine (CYMBALTA) 60 MG capsule Take 60 mg by mouth at bedtime.    . fesoterodine (TOVIAZ) 4 MG TB24 Take 4 mg by mouth  at bedtime.     . lamoTRIgine (LAMICTAL) 200 MG tablet Take 200 mg by mouth at bedtime.    Marland Kitchen. lisdexamfetamine (VYVANSE) 50 MG capsule Take 50 mg by mouth daily.    Lorita Officer. Norgestim-Eth Estrad Triphasic (ORTHO TRI-CYCLEN, 28, PO) Take by mouth.       Lab Results: UA: No RBC's or wbc,s on UA from yest.  0-2 rbc and 7-10 wbc on 11/20/14   Recent Labs  11/21/14 0222 11/22/14 0244 11/23/14 0401  WBC 22.9* 18.8* 16.7*  HGB 11.3* 11.8* 11.1*  HCT 34.9* 35.9* 34.2*  PLT 328 352 398   BMET  Recent Labs  11/21/14 0222 11/22/14 0244 11/23/14 0401  NA 136 139 137  K 3.3* 3.4* 3.6  CL 102 106 105  CO2 25 24 23   GLUCOSE 105* 108* 111*  BUN <5* 8 9  CREATININE 1.06* 2.41* 3.15*  CALCIUM 8.0* 8.2* 8.3*  PHOS  --   --  4.1   LFT  Recent Labs  11/21/14 0222 11/23/14 0401  PROT 7.1  --   ALBUMIN 2.5* 2.3*  AST 23  --   ALT 17  --   ALKPHOS 79  --   BILITOT 0.4  --    Ct Soft Tissue Neck Wo Contrast  11/22/2014  CLINICAL DATA:  Tonsillitis, positive fallen Streptococcus screening. EXAM: CT NECK WITHOUT CONTRAST TECHNIQUE: Multidetector CT imaging of the neck was performed following the standard protocol without intravenous contrast. COMPARISON:  None. FINDINGS: There are bilateral anterior and posterior cervical lymph nodes with reactive appearance. There is no nasopharyngeal or oropharyngeal mass. Lingual tonsils are enlarged. There is no focal fluid collection. The airway is patent. The visualized portions of the brain demonstrate no focal abnormality. The paranasal sinuses are clear. The lung apices are clear. There is no lytic or blastic osseous lesion. IMPRESSION: Bilateral cervical lymphadenopathy with reactive appearance. Enlargement of the lingual tonsils. No evidence of peritonsillar abscess, given lack of IV contrast. Electronically Signed   By: Ted Mcalpine M.D.   On: 11/22/2014 14:45   US Renal  11/22/2014  CLINICAL DATA:  Renal insufficiency. Elevated serum BUN and  creatinine. Initial encounter. EXAM: RENAL / URINARY TRACT ULTRASOUND COMPLETE COMPARISON:  Lumbar MRI 04/26/2013.  Abdominal CT 06/21/2012. FINDINGS: Examination is technically limited by the patient's body habitus. Right Kidney: Length: 13.3 cm. Echogenicity within normal limits. No mass or hydronephrosis visualized. Left Kidney: Length: 13.1 cm. Echogenicity within normal limits. No mass or hydronephrosis visualized. Bladder: Appears normal for degree of bladder distention. The bladder is nearly empty and suboptimally evaluated. IMPRESSION: Normal renal ultrasound. Parenchymal detail limited by body habitus. No hydronephrosis. Electronically Signed   By: Carey Bullocks M.D.   On: 11/22/2014 14:18    ROS: N Change in vision No SOB No CP No abd pain.  + loose stools since admission No dysuria No arthritic Sxs No skin rash.  PHYSICAL EXAM: Blood pressure 123/83, pulse 106, temperature 99 F (37.2 C), temperature source Oral, resp. rate 18, height  (1.676 m), weight 136.261 kg (300 lb 6.4 oz), last menstrual period 11/06/2014, SpO2 89 %. HEENT: PERRLA EOMI.  Bil tonsillar exudate NECK:+ submandibular nodes LUNGS:Clear CARDIAC:RRR wo MRG ABD:+ BS NTND no HSM EXT:No edema Skin:  No rash NEURO:CNI M&SI  Ox3 no asterixis  Assessment: 1. ARF most likely due to ATN from acute infection, lowish BP, NSAID's  +/- vanco.  With a benign UA I am not inclined to do much in terms of WU at this  time but will recheck UA to make sure there are no rbc/wbc 2. Strep throat 3. Bipolar 4. Depression PLAN: 1. DC IV fluids 2. Dc telemetry 3. Give po lasix 4. Daily labs 5. She wants to go home but I told that she can't go until Scr stabilizes to improves   MATTINGLY,MICHAEL T 11/23/2014, 12:37 PM

## 2014-11-23 NOTE — Progress Notes (Signed)
As per order, telemetry discontinued and CCMD notified.  Maintenance fluids discontinued.  Loss of IV access and pt refuses to have another IV placed.  MD is aware.

## 2014-11-23 NOTE — Progress Notes (Signed)
Pt is tearful, wants to go home; states that she suffers with agoraphobia.  MD notified.

## 2014-11-23 NOTE — Progress Notes (Addendum)
ANTIBIOTIC CONSULT NOTE  Pharmacy Consult for Zosyn Indication: rule out sepsis  Allergies  Allergen Reactions  . Latex Rash    Patient Measurements: Height: 5\' 6"  (167.6 cm) Weight: (!) 300 lb 6.4 oz (136.261 kg) (scale a) IBW/kg (Calculated) : 59.3  Vital Signs: Temp: 99 F (37.2 C) (10/28 0427) Temp Source: Oral (10/28 0427) BP: 123/83 mmHg (10/28 0427) Pulse Rate: 106 (10/28 0521)  Labs:  Recent Labs  11/21/14 0222 11/22/14 0244 11/22/14 0909 11/23/14 0401  WBC 22.9* 18.8*  --  16.7*  HGB 11.3* 11.8*  --  11.1*  PLT 328 352  --  398  LABCREA  --   --  39.32  --   CREATININE 1.06* 2.41*  --  3.15*   Estimated Creatinine Clearance: 38.8 mL/min (by C-G formula based on Cr of 3.15).  Microbiology: Recent Results (from the past 720 hour(s))  Culture, blood (routine x 2)     Status: None (Preliminary result)   Collection Time: 11/20/14 12:45 AM  Result Value Ref Range Status   Specimen Description BLOOD RIGHT HAND  Final   Special Requests BOTTLES DRAWN AEROBIC AND ANAEROBIC 5CC  Final   Culture NO GROWTH 2 DAYS  Final   Report Status PENDING  Incomplete  Rapid strep screen     Status: Abnormal   Collection Time: 11/20/14 12:45 AM  Result Value Ref Range Status   Streptococcus, Group A Screen (Direct) POSITIVE (A) NEGATIVE Final  Culture, blood (routine x 2)     Status: None (Preliminary result)   Collection Time: 11/20/14 12:46 AM  Result Value Ref Range Status   Specimen Description BLOOD LEFT HAND  Final   Special Requests BOTTLES DRAWN AEROBIC AND ANAEROBIC 5CC  Final   Culture NO GROWTH 2 DAYS  Final   Report Status PENDING  Incomplete    Medical History: Past Medical History  Diagnosis Date  . Depression   . Anxiety   . Bipolar 1 disorder [facility](HCC)      Assessment: 24 y/o female here with fever and noted with suppurative tonsillitis on zosyn. Strep screen is positive and cultures NGTD, WBC= 16.7 (down), afebrile, SCr= 3.15 (up from 1.06 on 10/26)  and CrCl ~ 40.   -vancomycin random was 76 on 10/27 (at ~ 8am) and noted 54 on 10/28 (last dose of vancomycin was 1250mg  on 11/22/14 at ~ 2:30am)  10/25 BCx2 - ngtd  10/25 Vanc>> 10/27 10/25 Zosyn>>  Plan:  -Zosyn 3.375G IV q8h to be infused over 4 hours -Consider de-escalation of therapy?  Harland Germanndrew [place] Mcphee, Pharm D 11/23/2014 8:40 AM   Addendum -Spoke with Dr. Arthor CaptainElmahi and plans to change to Augmentin (plans for total of 7 days antibiotic; currently day 4 of zosyn)  Plan -Augmentin 500mg  po q12h (4 days to complete a total of 7 days therapy)  Harland GermanAndrew Torben Soloway, Pharm D 11/23/2014 11:11 AM

## 2014-11-23 NOTE — Progress Notes (Signed)
[facility] PROGRESS NOTE   Denise Conrad ZOX:[id number] DOB: 1990/04/30 DOA: 11/20/2014 PCP: Tomi Bamberger, NP  HPI/Subjective: Denies any fever or chills, reported she's making good amount urine but not charting all of it. Creatinine worse today at 3.15.  Assessment/Plan: Principal Problem:   Acute suppurative tonsillitis Active Problems:   Bipolar 1 disorder (HCC)   Anxiety   Sepsis (HCC)   Acute renal failure (HCC)   Sepsis Presented to the hospital with fever of 101.4, WBC of 33.4 with presence of pharyngotonsillitis/UTI. Started on broad-spectrum antibiotics. Aggressive hydration with IV fluids. Urine and blood cultures obtained.  Pharyngotonsillitis GABHS Patient has purulent drainage on both tonsils, right more than left. Positive for Streptococcus screen. Patient is on Zosyn and vancomycin, IV antibiotics discontinued, currently on oral Augmentin. Still has significant leukocytosis, CT scan did not show peritonsillar abscess.  Acute renal failure Baseline creatinine is 0.7 on admission, creatinine increased today to 2.41 reviewed Patient only made about 600 mL of urine yesterday (if all urine output charted), increase IV fluids. FENa is 2.8 consistent with intrinsic renal disease, did not receive contrast. On vancomycin, random level is 76 drawn at 8:10 AM, last vancomycin dose is 1.25 g given at 2:24 AM on 10/27. Renal ultrasound without obstruction or stones, no pyuria or casts to suggest AIN. I have asked nephrology to evaluate the patient.  UTI Urinalysis consistent with UTI, patient reported she has chronic urinary frequency. Urine cultures negative.  Bipolar disorder and anxiety Patient is on lamotrigine, duloxetine and clonazepam, continued.  Code Status: Full Code Family Communication: Plan discussed with the patient. Disposition Plan: Remains inpatient Diet: Diet regular Room service appropriate?: Yes; Fluid consistency::  Thin  Consultants:  None  Procedures:  None  Antibiotics:  Zosyn and vancomycin   Objective: Filed Vitals:   11/23/14 0521  BP:   Pulse: 106  Temp:   Resp:     Intake/Output Summary (Last 24 hours) at 11/23/14 1216 Last data filed at 11/23/14 1153  Gross per 24 hour  Intake 9689.58 ml  Output    990 ml  Net 8699.58 ml   Filed Weights   11/21/14 0606 11/22/14 0556 11/23/14 0427  Weight: 133.584 kg (294 lb 8 oz) 134.673 kg (296 lb 14.4 oz) 136.261 kg (300 lb 6.4 oz)    Exam: General: Alert and awake, oriented x3, not in any acute distress. HEENT: anicteric sclera, pupils reactive to light and accommodation, EOMI CVS: S1-S2 clear, no murmur rubs or gallops Chest: clear to auscultation bilaterally, no wheezing, rales or rhonchi Abdomen: soft nontender, nondistended, normal bowel sounds, no organomegaly Extremities: no cyanosis, clubbing or edema noted bilaterally Neuro: Cranial nerves II-XII intact, no focal neurological deficits  Data Reviewed: Basic Metabolic Panel:  Recent Labs Lab 11/20/14 0045 11/20/14 0740 11/21/14 0222 11/22/14 0244 11/23/14 0401  NA 133* 134* 136 139 137  K 3.6 3.6 3.3* 3.4* 3.6  CL 96* 101 102 106 105  CO2 GLUCOSE 144* 107* 105* 108* 111*  BUN <5* <5* <5* 8 9  CREATININE 0.85 0.79 1.06* 2.41* 3.15*  CALCIUM 9.3 7.7* 8.0* 8.2* 8.3*  PHOS  --   --   --   --  4.1  Estimated Creatinine Clearance: 38.8 mL/min (by C-G formula based on Cr of 3.15).  Liver Function Tests:  Recent Labs Lab 11/20/14 0045 11/21/14 0222 11/23/14 0401  AST 32 23  --   ALT 23 17  --   ALKPHOS 65 79  --  BILITOT 0.5 0.4  --   PROT 7.4 7.1  --   ALBUMIN 3.2* 2.5* 2.3*   No results for input(s): LIPASE, AMYLASE in the last 168 hours. No results for input(s): AMMONIA in the last 168 hours. CBC:  Recent Labs Lab 11/20/14 0045 11/20/14 0740 11/21/14 0222 11/22/14 0244 11/23/14 0401  WBC 33.4* 22.9* 22.9* 18.8* 16.7*   NEUTROABS 28.7*  --   --   --  13.4*  HGB 14.2 12.1 11.3* 11.8* 11.1*  HCT 41.4 36.8 34.9* 35.9* 34.2*  MCV 83.5 84.0 86.0 85.1 83.8  PLT 381 311 328 352 398   Cardiac Enzymes: No results for input(s): CKTOTAL, CKMB, CKMBINDEX, TROPONINI in the last 168 hours. BNP (last 3 results) No results for input(s): BNP in the last 8760 hours.  ProBNP (last 3 results) No results for input(s): PROBNP in the last 8760 hours.  CBG:  Recent Labs Lab 11/20/14 0037 11/20/14 1610  GLUCAP 159* 135*    Micro Recent Results (from the past 240 hour(s))  Culture, blood (routine x 2)     Status: None (Preliminary result)   Collection Time: 11/20/14 12:45 AM  Result Value Ref Range Status   Specimen Description BLOOD RIGHT HAND  Final   Special Requests BOTTLES DRAWN AEROBIC AND ANAEROBIC 5CC  Final   Culture NO GROWTH 2 DAYS  Final   Report Status PENDING  Incomplete  Rapid strep screen     Status: Abnormal   Collection Time: 11/20/14 12:45 AM  Result Value Ref Range Status   Streptococcus, Group A Screen (Direct) POSITIVE (A) NEGATIVE Final  Culture, blood (routine x 2)     Status: None (Preliminary result)   Collection Time: 11/20/14 12:46 AM  Result Value Ref Range Status   Specimen Description BLOOD LEFT HAND  Final   Special Requests BOTTLES DRAWN AEROBIC AND ANAEROBIC 5CC  Final   Culture NO GROWTH 2 DAYS  Final   Report Status PENDING  Incomplete     Studies: Ct Soft Tissue Neck Wo Contrast  11/22/2014  CLINICAL DATA:  Tonsillitis, positive fallen Streptococcus screening. EXAM: CT NECK WITHOUT CONTRAST TECHNIQUE: Multidetector CT imaging of the neck was performed following the standard protocol without intravenous contrast. COMPARISON:  None. FINDINGS: There are bilateral anterior and posterior cervical lymph nodes with reactive appearance. There is no nasopharyngeal or oropharyngeal mass. Lingual tonsils are enlarged. There is no focal fluid collection. The airway is patent. The  visualized portions of the brain demonstrate no focal abnormality. The paranasal sinuses are clear. The lung apices are clear. There is no lytic or blastic osseous lesion. IMPRESSION: Bilateral cervical lymphadenopathy with reactive appearance. Enlargement of the lingual tonsils. No evidence of peritonsillar abscess, given lack of IV contrast. Electronically Signed   By: Ted Mcalpineobrinka  Dimitrova M.D.   On: 11/22/2014 14:45   [place] Renal  11/22/2014  CLINICAL DATA:  Renal insufficiency. Elevated serum BUN and creatinine. Initial encounter. EXAM: RENAL / URINARY TRACT ULTRASOUND COMPLETE COMPARISON:  Lumbar MRI 04/26/2013.  Abdominal CT 06/21/2012. FINDINGS: Examination is technically limited by the patient's body habitus. Right Kidney: Length: 13.3 cm. Echogenicity within normal limits. No mass or hydronephrosis visualized. Left Kidney: Length: 13.1 cm. Echogenicity within normal limits. No mass or hydronephrosis visualized. Bladder: Appears normal for degree of bladder distention. The bladder is nearly empty and suboptimally evaluated. IMPRESSION: Normal renal ultrasound. Parenchymal detail limited by body habitus. No hydronephrosis. Electronically Signed   By: Carey BullocksWilliam  Veazey M.D.   On: 11/22/2014 14:18  Scheduled Meds: . amoxicillin-clavulanate  1 tablet Oral BID  . calcium carbonate  1 tablet Oral TID WC  . enoxaparin (LOVENOX) injection  40 mg Subcutaneous Q24H  . famotidine  20 mg Oral QHS  . fesoterodine  4 mg Oral QHS  . lamoTRIgine  200 mg Oral QHS  . sodium chloride  3 mL Intravenous Q12H   Continuous Infusions: . sodium chloride 125 mL/hr (11/22/14 0829)       Time spent: 35 minutes    [facility]  [facility] Pager [phone] If 7PM-7AM, please contact night-coverage at [email or web address], password [facility] 11/23/2014, 12:16 PM  LOS: 3 days

## 2014-11-24 LAB — CBC WITH DIFFERENTIAL/PLATELET
BASOS ABS: 0 10*3/uL (ref 0.0–0.1)
Basophils Relative: 0 %
EOS ABS: 0.2 10*3/uL (ref 0.0–0.7)
Eosinophils Relative: 1 %
HCT: 36.1 % (ref 36.0–46.0)
HEMOGLOBIN: 12.5 g/dL (ref 12.0–15.0)
LYMPHS PCT: 22 %
Lymphs Abs: 4 10*3/uL (ref 0.7–4.0)
MCH: 28.4 pg (ref 26.0–34.0)
MCHC: 34.6 g/dL (ref 30.0–36.0)
MCV: 82 fL (ref 78.0–100.0)
Monocytes Absolute: 0.6 10*3/uL (ref 0.1–1.0)
Monocytes Relative: 3 %
NEUTROS PCT: 74 %
Neutro Abs: 13.6 10*3/uL — ABNORMAL HIGH (ref 1.7–7.7)
Platelets: 419 10*3/uL — ABNORMAL HIGH (ref 150–400)
RBC: 4.4 MIL/uL (ref 3.87–5.11)
RDW: 13.7 % (ref 11.5–15.5)
WBC: 18.4 10*3/uL — ABNORMAL HIGH (ref 4.0–10.5)

## 2014-11-24 LAB — RENAL FUNCTION PANEL
ALBUMIN: 2.5 g/dL — AB (ref 3.5–5.0)
ANION GAP: 14 (ref 5–15)
BUN: 10 mg/dL (ref 6–20)
CALCIUM: 9.4 mg/dL (ref 8.9–10.3)
CO2: 24 mmol/L (ref 22–32)
Chloride: 103 mmol/L (ref 101–111)
Creatinine, Ser: 3.53 mg/dL — ABNORMAL HIGH (ref 0.44–1.00)
GFR, EST AFRICAN AMERICAN: 20 mL/min — AB (ref >60)
GFR, EST NON AFRICAN AMERICAN: 17 mL/min — AB (ref >60)
Glucose, Bld: 106 mg/dL — ABNORMAL HIGH (ref 65–99)
PHOSPHORUS: 4.6 mg/dL (ref 2.5–4.6)
Potassium: 3.4 mmol/L — ABNORMAL LOW (ref 3.5–5.1)
SODIUM: 141 mmol/L (ref 135–145)

## 2014-11-24 MED ORDER — POTASSIUM CHLORIDE CRYS ER 20 MEQ PO TBCR
20.0000 meq | EXTENDED_RELEASE_TABLET | Freq: Two times a day (BID) | ORAL | Status: AC
  Administered 2014-11-24 (×2): 20 meq via ORAL
  Filled 2014-11-24 (×2): qty 1

## 2014-11-24 MED ORDER — FUROSEMIDE 80 MG PO TABS: 80.0000 mg | ORAL_TABLET | Freq: Every day | ORAL | Status: DC

## 2014-11-24 NOTE — Progress Notes (Signed)
[facility] PROGRESS NOTE   Denise Conrad ZOX:[id number] DOB: 24-Jan-1991 DOA: 11/20/2014 PCP: Tomi Bamberger, NP  HPI/Subjective: Patient seen, denies any complaints. Sleepy.  Appreciate nephrology's help, IV fluids discontinued started on Lasix.  Assessment/Plan: Principal Problem:   Acute suppurative tonsillitis Active Problems:   Bipolar 1 disorder (HCC)   Anxiety   Sepsis (HCC)   Acute renal failure (HCC)   Sepsis Presented to the hospital with fever of 101.4, WBC of 33.4 with presence of pharyngotonsillitis/UTI. Started on broad-spectrum antibiotics. Aggressive hydration with IV fluids, now IV fluids discontinued. Urine and blood cultures obtained, both NGTD.  Pharyngotonsillitis likely due to GABHS Patient has purulent drainage on both tonsils, right more than left. Positive for Streptococcus screen. Patient is on Zosyn and vancomycin, IV antibiotics discontinued, currently on oral Augmentin. Still has significant leukocytosis, CT scan did not show peritonsillar abscess.  Acute renal failure Baseline creatinine is 0.7 on admission, creatinine increased today to 2.41 reviewed Patient only made about 600 mL of urine yesterday (if all urine output charted), increase IV fluids. FENa is 2.8 consistent with intrinsic renal disease, did not receive contrast. On vancomycin, random level is 76 drawn at 8:10 AM, last vancomycin dose is 1.25 g given at 2:24 AM on 10/27. Renal ultrasound without obstruction or stones, no pyuria or casts to suggest AIN. Per nephrology likely ATN secondary to acute illness and probable hypertension.  UTI Urinalysis consistent with UTI, patient reported she has chronic urinary frequency. Urine cultures negative.  Bipolar disorder and anxiety Patient is on lamotrigine, duloxetine and clonazepam, continued.  Code Status: Full Code Family Communication: Plan discussed with the patient. Disposition Plan: Remains inpatient Diet: Diet  regular Room service appropriate?: Yes; Fluid consistency:: Thin  Consultants:  None  Procedures:  None  Antibiotics:  Zosyn and vancomycin   Objective: Filed Vitals:   11/24/14 0431  BP: 121/62  Pulse: 112  Temp: 97.9 F (36.6 C)  Resp: 18    Intake/Output Summary (Last 24 hours) at 11/24/14 1107 Last data filed at 11/24/14 0920  Gross per 24 hour  Intake 1658.33 ml  Output   3260 ml  Net -1601.67 ml   Filed Weights   11/22/14 0556 11/23/14 0427 11/24/14 0431  Weight: 134.673 kg (296 lb 14.4 oz) 136.261 kg (300 lb 6.4 oz) 133.584 kg (294 lb 8 oz)    Exam: General: Alert and awake, oriented x3, not in any acute distress. HEENT: anicteric sclera, pupils reactive to light and accommodation, EOMI CVS: S1-S2 clear, no murmur rubs or gallops Chest: clear to auscultation bilaterally, no wheezing, rales or rhonchi Abdomen: soft nontender, nondistended, normal bowel sounds, no organomegaly Extremities: no cyanosis, clubbing or edema noted bilaterally Neuro: Cranial nerves II-XII intact, no focal neurological deficits  Data Reviewed: Basic Metabolic Panel:  Recent Labs Lab 11/20/14 0740 11/21/14 0222 11/22/14 0244 11/23/14 0401 11/24/14 0407  NA 134* 136 139 137 141  K 3.6 3.3* 3.4* 3.6 3.4*  CL 101 102 106 105 103  CO2 GLUCOSE 107* 105* 108* 111* 106*  BUN <5* <5* CREATININE 0.79 1.06* 2.41* 3.15* 3.53*  CALCIUM 7.7* 8.0* 8.2* 8.3* 9.4  PHOS  --   --   --  4.1 4.6  Estimated Creatinine Clearance: 34.2 mL/min (by C-G formula based on Cr of 3.53).  Liver Function Tests:  Recent Labs Lab 11/20/14 0045 11/21/14 0222 11/23/14 0401 11/24/14 0407  AST 32 23  --   --  ALT 23 17  --   --   ALKPHOS 65 79  --   --   BILITOT 0.5 0.4  --   --   PROT 7.4 7.1  --   --   ALBUMIN 3.2* 2.5* 2.3* 2.5*   No results for input(s): LIPASE, AMYLASE in the last 168 hours. No results for input(s): AMMONIA in the last 168 hours. CBC:  Recent  Labs Lab 11/20/14 0045 11/20/14 0740 11/21/14 0222 11/22/14 0244 11/23/14 0401 11/24/14 0407  WBC 33.4* 22.9* 22.9* 18.8* 16.7* 18.4*  NEUTROABS 28.7*  --   --   --  13.4* 13.6*  HGB 14.2 12.1 11.3* 11.8* 11.1* 12.5  HCT 41.4 36.8 34.9* 35.9* 34.2* 36.1  MCV 83.5 84.0 86.0 85.1 83.8 82.0  PLT 381 311 328 352 398 419*   Cardiac Enzymes: No results for input(s): CKTOTAL, CKMB, CKMBINDEX, TROPONINI in the last 168 hours. BNP (last 3 results) No results for input(s): BNP in the last 8760 hours.  ProBNP (last 3 results) No results for input(s): PROBNP in the last 8760 hours.  CBG:  Recent Labs Lab 11/20/14 0037 11/20/14 1610  GLUCAP 159* 135*    Micro Recent Results (from the past 240 hour(s))  Culture, blood (routine x 2)     Status: None (Preliminary result)   Collection Time: 11/20/14 12:45 AM  Result Value Ref Range Status   Specimen Description BLOOD RIGHT HAND  Final   Special Requests BOTTLES DRAWN AEROBIC AND ANAEROBIC 5CC  Final   Culture NO GROWTH 3 DAYS  Final   Report Status PENDING  Incomplete  Rapid strep screen     Status: Abnormal   Collection Time: 11/20/14 12:45 AM  Result Value Ref Range Status   Streptococcus, Group A Screen (Direct) POSITIVE (A) NEGATIVE Final  Culture, blood (routine x 2)     Status: None (Preliminary result)   Collection Time: 11/20/14 12:46 AM  Result Value Ref Range Status   Specimen Description BLOOD LEFT HAND  Final   Special Requests BOTTLES DRAWN AEROBIC AND ANAEROBIC 5CC  Final   Culture NO GROWTH 3 DAYS  Final   Report Status PENDING  Incomplete     Studies: Ct Soft Tissue Neck Wo Contrast  11/22/2014  CLINICAL DATA:  Tonsillitis, positive fallen Streptococcus screening. EXAM: CT NECK WITHOUT CONTRAST TECHNIQUE: Multidetector CT imaging of the neck was performed following the standard protocol without intravenous contrast. COMPARISON:  None. FINDINGS: There are bilateral anterior and posterior cervical lymph nodes  with reactive appearance. There is no nasopharyngeal or oropharyngeal mass. Lingual tonsils are enlarged. There is no focal fluid collection. The airway is patent. The visualized portions of the brain demonstrate no focal abnormality. The paranasal sinuses are clear. The lung apices are clear. There is no lytic or blastic osseous lesion. IMPRESSION: Bilateral cervical lymphadenopathy with reactive appearance. Enlargement of the lingual tonsils. No evidence of peritonsillar abscess, given lack of IV contrast. Electronically Signed   By: Ted Mcalpine M.D.   On: 11/22/2014 14:45   US Renal  11/22/2014  CLINICAL DATA:  Renal insufficiency. Elevated serum BUN and creatinine. Initial encounter. EXAM: RENAL / URINARY TRACT ULTRASOUND COMPLETE COMPARISON:  Lumbar MRI 04/26/2013.  Abdominal CT 06/21/2012. FINDINGS: Examination is technically limited by the patient's body habitus. Right Kidney: Length: 13.3 cm. Echogenicity within normal limits. No mass or hydronephrosis visualized. Left Kidney: Length: 13.1 cm. Echogenicity within normal limits. No mass or hydronephrosis visualized. Bladder: Appears normal for degree of bladder distention. The  bladder is nearly empty and suboptimally evaluated. IMPRESSION: Normal renal ultrasound. Parenchymal detail limited by body habitus. No hydronephrosis. Electronically Signed   By: Carey BullocksWilliam  Veazey M.D.   On: 11/22/2014 14:18    Scheduled Meds: . amoxicillin-clavulanate  1 tablet Oral BID  . calcium carbonate  1 tablet Oral TID WC  . enoxaparin (LOVENOX) injection  40 mg Subcutaneous Q24H  . famotidine  20 mg Oral QHS  . fesoterodine  4 mg Oral QHS  . [START ON 11/25/2014] furosemide  80 mg Oral Daily  . lamoTRIgine  200 mg Oral QHS  . potassium chloride  20 mEq Oral BID  . sodium chloride  3 mL Intravenous Q12H   Continuous Infusions:       Time spent: 35 minutes    [facility] Trans[facility] Care CenterELMAHI,Kei Mcelhiney A  [facility] Pager [phone] If 7PM-7AM, please contact  night-coverage at [email or web address], password [email or web address] 11/24/2014, 11:07 AM  LOS: 4 days

## 2014-11-24 NOTE — Progress Notes (Signed)
S: No new CO O:BP 121/62 mmHg  Pulse 112  Temp(Src) 97.9 F (36.6 C) (Oral)  Resp 18  Ht  (1.676 m)  Wt 133.584 kg (294 lb 8 oz)  BMI 47.56 kg/m2  SpO2 100%  LMP 11/06/2014  Intake/Output Summary (Last 24 hours) at 11/24/14 0815 Last data filed at 11/24/14 0600  Gross per 24 hour  Intake 1898.33 ml  Output   2700 ml  Net -801.67 ml   Weight change: -2.676 kg (-5 lb 14.4 oz) ZOX:WRUEA and alert CVS:RRR Resp:clear Abd:+ BS NTND Ext: no edema NEURO: CNI Ox3 no asterixis   . amoxicillin-clavulanate  1 tablet Oral BID  . calcium carbonate  1 tablet Oral TID WC  . enoxaparin (LOVENOX) injection  40 mg Subcutaneous Q24H  . famotidine  20 mg Oral QHS  . fesoterodine  4 mg Oral QHS  . furosemide  80 mg Oral BID  . lamoTRIgine  200 mg Oral QHS  . sodium chloride  3 mL Intravenous Q12H   Ct Soft Tissue Neck Wo Contrast  11/22/2014  CLINICAL DATA:  Tonsillitis, positive fallen Streptococcus screening. EXAM: CT NECK WITHOUT CONTRAST TECHNIQUE: Multidetector CT imaging of the neck was performed following the standard protocol without intravenous contrast. COMPARISON:  None. FINDINGS: There are bilateral anterior and posterior cervical lymph nodes with reactive appearance. There is no nasopharyngeal or oropharyngeal mass. Lingual tonsils are enlarged. There is no focal fluid collection. The airway is patent. The visualized portions of the brain demonstrate no focal abnormality. The paranasal sinuses are clear. The lung apices are clear. There is no lytic or blastic osseous lesion. IMPRESSION: Bilateral cervical lymphadenopathy with reactive appearance. Enlargement of the lingual tonsils. No evidence of peritonsillar abscess, given lack of IV contrast. Electronically Signed   By: Ted Mcalpine M.D.   On: 11/22/2014 14:45   US Renal  11/22/2014  CLINICAL DATA:  Renal insufficiency. Elevated serum BUN and creatinine. Initial encounter. EXAM: RENAL / URINARY TRACT ULTRASOUND  COMPLETE COMPARISON:  Lumbar MRI 04/26/2013.  Abdominal CT 06/21/2012. FINDINGS: Examination is technically limited by the patient's body habitus. Right Kidney: Length: 13.3 cm. Echogenicity within normal limits. No mass or hydronephrosis visualized. Left Kidney: Length: 13.1 cm. Echogenicity within normal limits. No mass or hydronephrosis visualized. Bladder: Appears normal for degree of bladder distention. The bladder is nearly empty and suboptimally evaluated. IMPRESSION: Normal renal ultrasound. Parenchymal detail limited by body habitus. No hydronephrosis. Electronically Signed   By: Carey Bullocks M.D.   On: 11/22/2014 14:18   BMET    Component Value Date/Time   NA 141 11/24/2014 0407   K 3.4* 11/24/2014 0407   CL 103 11/24/2014 0407   CO2 24 11/24/2014 0407   GLUCOSE 106* 11/24/2014 0407   BUN 10 11/24/2014 0407   CREATININE 3.53* 11/24/2014 0407   CALCIUM 9.4 11/24/2014 0407   GFRNONAA 17* 11/24/2014 0407   GFRAA 20* 11/24/2014 0407   CBC    Component Value Date/Time   WBC 18.4* 11/24/2014 0407   RBC 4.40 11/24/2014 0407   HGB 12.5 11/24/2014 0407   HCT 36.1 11/24/2014 0407   PLT 419* 11/24/2014 0407   MCV 82.0 11/24/2014 0407   MCH 28.4 11/24/2014 0407   MCHC 34.6 11/24/2014 0407   RDW 13.7 11/24/2014 0407   LYMPHSABS 4.0 11/24/2014 0407   MONOABS 0.6 11/24/2014 0407   EOSABS 0.2 11/24/2014 0407   BASOSABS 0.0 11/24/2014 0407     Assessment: 1. ARF most likely sec to ATN from  acute infection, lowish BP, NSAID's +/- vanco.  UO good but Scr sl higher 2. Strep throat 3. Bipolar 4. Depression 5. hypokalemia  Plan: 1. Replace K 2. Recheck labs in AM 3. Decrease lasix to q d   Merrit Friesen T

## 2014-11-25 LAB — RENAL FUNCTION PANEL
ALBUMIN: 2.5 g/dL — AB (ref 3.5–5.0)
ANION GAP: 15 (ref 5–15)
BUN: 12 mg/dL (ref 6–20)
CHLORIDE: 97 mmol/L — AB (ref 101–111)
CO2: 26 mmol/L (ref 22–32)
Calcium: 9.3 mg/dL (ref 8.9–10.3)
Creatinine, Ser: 3.5 mg/dL — ABNORMAL HIGH (ref 0.44–1.00)
GFR calc Af Amer: 20 mL/min — ABNORMAL LOW (ref >60)
GFR calc non Af Amer: 17 mL/min — ABNORMAL LOW (ref >60)
GLUCOSE: 105 mg/dL — AB (ref 65–99)
POTASSIUM: 3 mmol/L — AB (ref 3.5–5.1)
Phosphorus: 5.2 mg/dL — ABNORMAL HIGH (ref 2.5–4.6)
Sodium: 138 mmol/L (ref 135–145)

## 2014-11-25 LAB — CULTURE, BLOOD (ROUTINE X 2)
CULTURE: NO GROWTH
Culture: NO GROWTH

## 2014-11-25 MED ORDER — POTASSIUM CHLORIDE CRYS ER 20 MEQ PO TBCR: 20.0000 meq | EXTENDED_RELEASE_TABLET | Freq: Once | ORAL | Status: DC

## 2014-11-25 MED ORDER — POTASSIUM CHLORIDE CRYS ER 20 MEQ PO TBCR
40.0000 meq | EXTENDED_RELEASE_TABLET | Freq: Two times a day (BID) | ORAL | Status: DC
  Administered 2014-11-25: 40 meq via ORAL
  Filled 2014-11-25: qty 2

## 2014-11-25 MED ORDER — AMOXICILLIN-POT CLAVULANATE 500-125 MG PO TABS: 1.0000 | ORAL_TABLET | Freq: Two times a day (BID) | ORAL | Status: DC

## 2014-11-25 NOTE — Discharge Summary (Addendum)
Physician Discharge Summary  Denise Conrad NWG:[id number] DOB: [birth year] DOA: 11/20/2014  PCP: Tomi Bamberger, NP  Admit date: 11/20/2014 Discharge date: 11/25/2014  Time spent: 40 minutes  Recommendations for Outpatient Follow-up:  1. Follow-up with primary care physician in one week. 2. BMP to be done on Tuesday, 11/27/2014.  Discharge Diagnoses:  Principal Problem:   Acute suppurative tonsillitis Active Problems:   Bipolar 1 disorder (HCC)   Anxiety   Sepsis (HCC)   Acute renal failure [facility])   Discharge Condition: Stable  Diet recommendation: Regular  Filed Weights   11/23/14 0427 11/24/14 0431 11/25/14 0439  Weight: 136.261 kg (300 lb 6.4 oz) 133.584 kg (294 lb 8 oz) 131.861 kg (290 lb 11.2 oz)    History of present illness:  Denise Conrad is a 24 y.o. female with a past medical history significant for bipolar disorder who presents with fever and weakness.  The patient was in her usual state of health until the past few days when she noted sore throat and ear pain. Today the patient had a fever to 103F and had myalgias and malaise. In the evening she was taking a shower, when she felt weak, collapsed, and was groaning. Her brother who lives in the same house, found her and describes her as "out of it" and somnolent and brought her to the ER.  In the ED, the patient was initially somnolent, febrile, tachycardic, and with leukocytosis, positive rapid strep testing and elevated lactic acid. A CT head was normal, CXR was normal, and UA showed contamination. She received IV fluids and became alert, oriented and conversational, and so LP was deferred.  Hospital Course:   Sepsis Presented to the hospital with fever of 101.4, WBC of 33.4 with presence of pharyngotonsillitis/UTI. Started on broad-spectrum antibiotics and aggressive hydration with IV fluids. Urine and blood cultures obtained, both NGTD. Sepsis physiology resolved prior to  discharge.  Pharyngotonsillitis likely due to GABHS Patient has purulent drainage on both tonsils, right more than left. Positive for Streptococcus screen. Patient is on Zosyn and vancomycin, IV antibiotics discontinued, currently on oral Augmentin. Still has significant leukocytosis, CT scan did not show peritonsillar abscess. Patient reported recurrent tonsillitis, follow-up with PCP and consider referral to ENT as outpatient.  Discharged on Augmentin for 5 more days.  Acute renal failure Baseline creatinine is 0.7 on admission, creatinine increased today to 2.41 reviewed Patient only made about 600 mL of urine yesterday (if all urine output charted), increase IV fluids. FENa is 2.8 consistent with intrinsic renal disease, did not receive contrast. On vancomycin, random level is 76 drawn at 8:10 AM, last vancomycin dose is 1.25 g given at 2:24 AM on 10/27. Renal ultrasound without obstruction or stones, no pyuria or casts to suggest AIN. Per nephrology likely ATN secondary to acute illness and probable hypotension. Nephrology recommended BMP to be done on 11/27/2014 and follow-up with PCP, if needed refer to nephrology as outpatient. Creatinine on discharge is 3.5.  Questionable UTI Urinalysis consistent with UTI, patient reported she has chronic urinary frequency. Urine cultures negative.  Bipolar disorder and anxiety Patient is on lamotrigine, duloxetine and clonazepam, continued.  Obesity Patient weight is 295 pounds, BMI 47.7, advised about weight loss.  Procedures:  None  Consultations  Nephrology  Exam: Filed Vitals:   11/25/14 0439  BP: 118/66  Pulse: 95  Temp: 98.2 F (36.8 C)  Resp: 18   General: Alert and awake, oriented x3, not in any acute distress. Overweight white young female HEENT: anicteric  sclera, pupils reactive to light and accommodation, EOMI CVS: S1-S2 clear, no murmur rubs or gallops Chest: clear to auscultation bilaterally, no wheezing, rales  or rhonchi Abdomen: soft nontender, nondistended, normal bowel sounds, no organomegaly Extremities: no cyanosis, clubbing or edema noted bilaterally Neuro: Cranial nerves II-XII intact, no focal neurological deficits  Discharge Instructions   Discharge Instructions    Diet - low sodium heart healthy    Complete by:  As directed      Increase activity slowly    Complete by:  As directed           Current Discharge Medication List    START taking these medications   Details  amoxicillin-clavulanate (AUGMENTIN) 500-125 MG tablet Take 1 tablet (500 mg total) by mouth 2 (two) times daily. Qty: 10 tablet, Refills: 0      CONTINUE these medications which have NOT CHANGED   Details  cetirizine (ZYRTEC) 10 MG tablet Take 10 mg by mouth daily.    clonazePAM (KLONOPIN) 0.5 MG tablet Take 0.5 mg by mouth 2 (two) times daily as needed for anxiety.    DULoxetine (CYMBALTA) 60 MG capsule Take 60 mg by mouth at bedtime.    fesoterodine (TOVIAZ) 4 MG TB24 Take 4 mg by mouth at bedtime.     lamoTRIgine (LAMICTAL) 200 MG tablet Take 200 mg by mouth at bedtime.    lisdexamfetamine (VYVANSE) 50 MG capsule Take 50 mg by mouth daily.    Norgestim-Eth Estrad Triphasic (ORTHO TRI-CYCLEN, 28, PO) Take by mouth.       Allergies  Allergen Reactions  . Latex Rash   Follow-up Information    Follow up with [facility], NP In 1 week.   Specialty:  Nurse Practitioner   Contact information:   [address] [place] [address] [phone]        The results of significant diagnostics from this hospitalization (including imaging, microbiology, ancillary and laboratory) are listed below for reference.    Significant Diagnostic Studies: Ct Head Wo Contrast  11/20/2014  CLINICAL DATA:  Acute onset of fever and severe lethargy. Headache and sore throat. Loss of consciousness. Initial encounter. EXAM: CT HEAD WITHOUT CONTRAST TECHNIQUE: Contiguous axial images were obtained from  the base of the skull through the vertex without intravenous contrast. COMPARISON:  None. FINDINGS: There is no evidence of acute infarction, mass lesion, or intra- or extra-axial hemorrhage on CT. The posterior fossa, including the cerebellum, brainstem and fourth ventricle, is within normal limits. The third and lateral ventricles, and basal ganglia are unremarkable in appearance. The cerebral hemispheres are symmetric in appearance, with normal gray-white differentiation. No mass effect or midline shift is seen. There is no evidence of fracture; visualized osseous structures are unremarkable in appearance. The visualized portions of the orbits are within normal limits. The paranasal sinuses and mastoid air cells are well-aerated. No significant soft tissue abnormalities are seen. IMPRESSION: Unremarkable noncontrast CT of the head. Electronically Signed   By: Roanna Raider M.D.   On: 11/20/2014 01:38   Ct Soft Tissue Neck Wo Contrast  11/22/2014  CLINICAL DATA:  Tonsillitis, positive fallen Streptococcus screening. EXAM: CT NECK WITHOUT CONTRAST TECHNIQUE: Multidetector CT imaging of the neck was performed following the standard protocol without intravenous contrast. COMPARISON:  None. FINDINGS: There are bilateral anterior and posterior cervical lymph nodes with reactive appearance. There is no nasopharyngeal or oropharyngeal mass. Lingual tonsils are enlarged. There is no focal fluid collection. The airway is patent. The visualized portions of the brain  demonstrate no focal abnormality. The paranasal sinuses are clear. The lung apices are clear. There is no lytic or blastic osseous lesion. IMPRESSION: Bilateral cervical lymphadenopathy with reactive appearance. Enlargement of the lingual tonsils. No evidence of peritonsillar abscess, given lack of IV contrast. Electronically Signed   By: Ted Mcalpine M.D.   On: 11/22/2014 14:45   US Renal  11/22/2014  CLINICAL DATA:  Renal insufficiency.  Elevated serum BUN and creatinine. Initial encounter. EXAM: RENAL / URINARY TRACT ULTRASOUND COMPLETE COMPARISON:  Lumbar MRI 04/26/2013.  Abdominal CT 06/21/2012. FINDINGS: Examination is technically limited by the patient's body habitus. Right Kidney: Length: 13.3 cm. Echogenicity within normal limits. No mass or hydronephrosis visualized. Left Kidney: Length: 13.1 cm. Echogenicity within normal limits. No mass or hydronephrosis visualized. Bladder: Appears normal for degree of bladder distention. The bladder is nearly empty and suboptimally evaluated. IMPRESSION: Normal renal ultrasound. Parenchymal detail limited by body habitus. No hydronephrosis. Electronically Signed   By: Carey Bullocks M.D.   On: 11/22/2014 14:18   Dg Chest Port 1 View  11/20/2014  CLINICAL DATA:  24 year old female with fever and headache. EXAM: PORTABLE CHEST 1 VIEW COMPARISON:  [facility]. FINDINGS: Lung volumes are low. No consolidative airspace disease. No pleural effusions. No pneumothorax. No pulmonary nodule or mass noted. Pulmonary vasculature and the cardiomediastinal silhouette are within normal limits. IMPRESSION: 1. Low lung volumes without radiographic evidence of acute cardiopulmonary disease. Electronically Signed   By: Trudie Reed M.D.   On: 11/20/2014 01:18    Microbiology: Recent Results (from the past 240 hour(s))  Culture, blood (routine x 2)     Status: None   Collection Time: 11/20/14 12:45 AM  Result Value Ref Range Status   Specimen Description BLOOD RIGHT HAND  Final   Special Requests BOTTLES DRAWN AEROBIC AND ANAEROBIC 5CC  Final   Culture NO GROWTH 5 DAYS  Final   Report Status 11/25/2014 FINAL  Final  Rapid strep screen     Status: Abnormal   Collection Time: 11/20/14 12:45 AM  Result Value Ref Range Status   Streptococcus, Group A Screen (Direct) POSITIVE (A) NEGATIVE Final  Culture, blood (routine x 2)     Status: None   Collection Time: 11/20/14 12:46 AM  Result Value Ref Range Status    Specimen Description BLOOD LEFT HAND  Final   Special Requests BOTTLES DRAWN AEROBIC AND ANAEROBIC 5CC  Final   Culture NO GROWTH 5 DAYS  Final   Report Status 11/25/2014 FINAL  Final     Labs: Basic Metabolic Panel:  Recent Labs Lab 11/21/14 0222 11/22/14 0244 11/23/14 0401 11/24/14 0407 11/25/14 0348  NA 136 139 137 141 138  K 3.3* 3.4* 3.6 3.4* 3.0*  CL 102 106 105 103 97*  CO2 25 24 23 24 26   GLUCOSE 105* 108* 111* 106* 105*  BUN <5* 8 9 10 12   CREATININE 1.06* 2.41* 3.15* 3.53* 3.50*  CALCIUM 8.0* 8.2* 8.3* 9.4 9.3  PHOS  --   --  4.1 4.6 5.2*   Liver Function Tests:  Recent Labs Lab 11/20/14 0045 11/21/14 0222 11/23/14 0401 11/24/14 0407 11/25/14 0348  AST 32 23  --   --   --   ALT 23 17  --   --   --   ALKPHOS 65 79  --   --   --   BILITOT 0.5 0.4  --   --   --   PROT 7.4 7.1  --   --   --  ALBUMIN 3.2* 2.5* 2.3* 2.5* 2.5*   No results for input(s): LIPASE, AMYLASE in the last 168 hours. No results for input(s): AMMONIA in the last 168 hours. CBC:  Recent Labs Lab 11/20/14 0045 11/20/14 0740 11/21/14 0222 11/22/14 0244 11/23/14 0401 11/24/14 0407  WBC 33.4* 22.9* 22.9* 18.8* 16.7* 18.4*  NEUTROABS 28.7*  --   --   --  13.4* 13.6*  HGB 14.2 12.1 11.3* 11.8* 11.1* 12.5  HCT 41.4 36.8 34.9* 35.9* 34.2* 36.1  MCV 83.5 84.0 86.0 85.1 83.8 82.0  PLT 381 311 328 352 398 419*   Cardiac Enzymes: No results for input(s): CKTOTAL, CKMB, CKMBINDEX, TROPONINI in the last 168 hours. BNP: BNP (last 3 results) No results for input(s): BNP in the last 8760 hours.  ProBNP (last 3 results) No results for input(s): PROBNP in the last 8760 hours.  CBG:  Recent Labs Lab 11/20/14 0037 11/20/14 1610  GLUCAP 159* 135*       Signed:  Roni Scow A  [facility] 11/25/2014, 11:26 AM

## 2014-11-25 NOTE — Progress Notes (Signed)
S: No new CO.  Throat much better O:BP 118/66 mmHg  Pulse 95  Temp(Src) 98.2 F (36.8 C) (Oral)  Resp 18  Ht 5\' 6"  (1.676 m)  Wt 131.861 kg (290 lb 11.2 oz)  BMI 46.94 kg/m2  SpO2 94%  LMP 11/06/2014  Intake/Output Summary (Last 24 hours) at 11/25/14 0739 Last data filed at 11/25/14 [id number]  Gross per 24 hour  Intake   1200 ml  Output   4650 ml  Net  -3450 ml   Weight change: -1.724 kg (-3 lb 12.8 oz) RUE:AVWUJGen:awake and alert Neck Submand glands nontender and smaller CVS:RRR Resp:clear Abd:+ BS NTND Ext: no edema NEURO: CNI Ox3 no asterixis   . amoxicillin-clavulanate  1 tablet Oral BID  . calcium carbonate  1 tablet Oral TID WC  . enoxaparin (LOVENOX) injection  40 mg Subcutaneous Q24H  . famotidine  20 mg Oral QHS  . fesoterodine  4 mg Oral QHS  . furosemide  80 mg Oral Daily  . lamoTRIgine  200 mg Oral QHS  . sodium chloride  3 mL Intravenous Q12H   No results found. BMET    Component Value Date/Time   NA 138 11/25/2014 0348   K 3.0* 11/25/2014 0348   CL 97* 11/25/2014 0348   CO2 26 11/25/2014 0348   GLUCOSE 105* 11/25/2014 0348   BUN 12 11/25/2014 0348   CREATININE 3.50* 11/25/2014 0348   CALCIUM 9.3 11/25/2014 0348   GFRNONAA 17* 11/25/2014 0348   GFRAA 20* 11/25/2014 0348   CBC    Component Value Date/Time   WBC 18.4* 11/24/2014 0407   RBC 4.40 11/24/2014 0407   HGB 12.5 11/24/2014 0407   HCT 36.1 11/24/2014 0407   PLT 419* 11/24/2014 0407   MCV 82.0 11/24/2014 0407   MCH 28.4 11/24/2014 0407   MCHC 34.6 11/24/2014 0407   RDW 13.7 11/24/2014 0407   LYMPHSABS 4.0 11/24/2014 0407   MONOABS 0.6 11/24/2014 0407   EOSABS 0.2 11/24/2014 0407   BASOSABS 0.0 11/24/2014 0407     Assessment: 1. ARF most likely sec to ATN from acute infection, lowish BP, NSAID's +/- vanco.  UO good but Scr sl higher 2. Strep throat 3. Bipolar 4. Depression 5. hypokalemia  Plan: 1. Replace K 2. Since renal fx is stable and I anticipate it to improve and she is very  anxious to go home, she can go from my standpoint and get FU labs on tues.  DC lasix  Shane Melby T

## 2014-12-25 ENCOUNTER — Ambulatory Visit: Payer: Medicaid Other | Admitting: Audiology

## 2015-02-19 ENCOUNTER — Ambulatory Visit: Payer: Medicaid Other | Attending: Nurse Practitioner | Admitting: Audiology

## 2015-02-19 DIAGNOSIS — Z01110 Encounter for hearing examination following failed hearing screening: Secondary | ICD-10-CM | POA: Diagnosis present

## 2015-02-19 DIAGNOSIS — Z0110 Encounter for examination of ears and hearing without abnormal findings: Secondary | ICD-10-CM | POA: Diagnosis present

## 2015-02-19 DIAGNOSIS — R94120 Abnormal auditory function study: Secondary | ICD-10-CM | POA: Insufficient documentation

## 2015-02-19 DIAGNOSIS — R292 Abnormal reflex: Secondary | ICD-10-CM | POA: Insufficient documentation

## 2015-02-19 NOTE — Procedures (Signed)
[facility] [address] [place], [place] [address] [phone]  AUDIOLOGICAL EVALUATION  Name: Denise Conrad DOB: 1990/12/07 MRN: [id number] Diagnosis:Tinnitus, abnormal hearing screen Date: 1/24/2017Referent: Tomi Bamberger, NP  HISTORY: Denise Conrad, age 25 y.o. years, was seen for a repeat audiological evaluation. She was previously seen here on 06/13/2014 with "tinnitus" and "normal hearing thresholds bilaterally with normal inner ear function and acoustic reflexes bilaterally. The inner ear function is present bilaterally but are borderline at some frequencies on the left side and in the right ear are present at all frequencies except at 10kHz". A repeat audiogram and inner ear function testing is recommended in 6 months to rule out a progressive hearing loss was scheduled for today.   Denise Conrad denies having tinnitus today but reports that it happens "a couple of times a week".  She reports having "a bad cold in December 2016".  It is important to note that EPIC indicates nephrology progress notes from 11/23/14 to 11/25/14. However, when Denise Conrad denied having kidney issues and stated that she had "problems when she was pregnant 6 years ago, but none recently". Denise Conrad states that she has been diagnosed with "dyslexia, migraines and bipolar".   EVALUATION: Pure tone air and bone conduction was completed using conventional audiometry with inserts. Hearing thresholds are stable when compared to the May 2016 evaluation with hearing thresholds of 20-25 dBHL from  -  and 10-20 dBHL from  to  bilaterally.  The hearing loss appears sensorineural bilaterally.Marland Kitchen Speech reception thresholds are 15 dBHL in the right ear and 20 dBHL in the left ear using recorded spondee words. The reliability is good. Word recognition is 100% at 55dBHL in the right and 96% at  55dBHL in the left using recorded NU-6 word lists in quiet. In minimal background noise with +5dB signal to noise ratio word recogntion is 76 % in the right ear and 80% in the left ear using PBK word lists in multitalker noise. Otoscopic inspection reveals clear ear canals with visible tympanic membranes.Tympanometry showed is within normal limits bilaterally for volume, compliance and pressure (Type A). Ipsilateral acoustic reflexes are elevated at  bilaterally. Distortion Product Otoacoustic Emission (DPOAE) testing from  - 10,000Hz  are poorer than May 2016 results and require close monitoring. Today results are weak or absent  From  - 10,000Hz  bilaterally.    CONCLUSION:   Denise Conrad needs to have her hearing closely monitored. The inner ear function is much poorer than on the May 2016 evaluation.  Although this may be an artifact of the "December 2016 cold" it may also be an early sign of hearing loss.  Of concern is that the [facility] October 2016 Nephrology notes indicate that Denise Conrad had been on "lasix", although Orilla did not provide additional information about this when asked.  A repeat hearing evaluation has been scheduled here in 3 months to monitor hearing.  Please note that all other test results are stable compared to the May 2016 evaluation including uncomfortable loudness levels.  Avalin thas normal hearing thresholds and excellent word recognition in quiet bilaterally. Her word recognition in background noise is stable. She denies having tinnitus today.  Makita continues to report that "loud sounds cause a migraine" so she tends to avoid loud areas.   RECOMMENDATIONS: 1. Monitor hearing closely with a repeat audiological evaluation in 3 months (earlier if there is any change in hearing or ear pressure) to measure inner ear function, tinnitus, elevated acoustic reflexes and hearing thresholds. This hearing evaluation has  been scheduled for December 25, 2014 at 10am. 2. Consider an auditory processing screening during the next visit. Keyatta reports a history of difficulty hearing when a competing message is present. 3. Minimize exposure to loud sounds and practice hearing conservation.   Deborah L. Kate Sable, Au.D., CCC-A Doctor of Audiology   cc: Tomi Bamberger, NP

## 2015-05-01 ENCOUNTER — Ambulatory Visit: Payer: Medicaid Other | Attending: Nurse Practitioner | Admitting: Audiology

## 2015-05-01 DIAGNOSIS — H9312 Tinnitus, left ear: Secondary | ICD-10-CM

## 2015-05-01 DIAGNOSIS — H9072 Mixed conductive and sensorineural hearing loss, unilateral, left ear, with unrestricted hearing on the contralateral side: Secondary | ICD-10-CM | POA: Diagnosis present

## 2015-05-01 DIAGNOSIS — H9192 Unspecified hearing loss, left ear: Secondary | ICD-10-CM | POA: Insufficient documentation

## 2015-05-01 DIAGNOSIS — H748X2 Other specified disorders of left middle ear and mastoid: Secondary | ICD-10-CM | POA: Diagnosis not present

## 2015-05-01 NOTE — Procedures (Signed)
[facility] [address] [place], [place] [address] [phone]  AUDIOLOGICAL EVALUATION  Name: Siarra Gilkerson DOB: July 30, 1990 MRN: [id number] Diagnosis:Tinnitus, abnormal hearing screen Date: 4/5/2017Referent: Tomi Bamberger, NP  HISTORY: Miamor Ayler, age 25 y.o. years, was seen for a repeat audiological evaluation to rule out a progressive hearing loss.Marland Kitchen She was previously seen here on 06/13/2014 and on 02/19/2015  with "tinnitus" and "normal hearing thresholds bilaterally with normal inner ear function and acoustic reflexes bilaterally. The inner ear function responses were present but  borderline at some frequencies bilaterally but were absent in the right ear at 10kHz".In addition, an auditory processing screen and/or evaluation was discussed but not completed.   Significant is that Makahla states that she "had the flu" and then a "couple of weeks ago developed an ear infection on the left side".  For the past couple of weeks "tinnitus is constant and is very loud".  She is worried about the increase and change in tinnitus as well as the difficulty that she is having hearing out of the left ear now.   EVALUATION: Pure tone air and bone conduction was completed using conventional audiometry with inserts. Hearing thresholds are stable on the right side compared to the May 2016 evaluation with hearing thresholds of 20 dBHL from  - 8000.  Left ear hearing thresholds are much poorer than previous results with a possible mixed component.  Masked air conduction thresholds are 50 dBHL from  - ; 55 dHBL at  and 65 dBHL at .  Masked bone conduction thresholds are 30-35 dBHL from  -  and 20 dBHL at .   Speech detection thresholds are consistent with hearing thresholds supporting good reliability.   Otoscopic inspection reveals clear ear canals with  visible tympanic membranes without redness. Tympanometry is poor and abnormal on the left side (Type B to borderline Type As) with absent acoustic reflexes and is within normal limits on the right side (Type As) with present and within normal limits acoustic reflexes.  CONCLUSION:   Mikiah Durall was treated for an ear infection two weeks ago.  I initially was going to postpone the hearing evaluation today, but Henri was very concerned about the degree of "hearing loss on the left side" and the "persistent and loud tinnitus" for the past two weeks.  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  Although it may be an artifact from the recent ear infection, the left ear masked bone conduction is poorer than previous results.  The right ear hearing thresholds and bone conduction are stable compared to previous results.    As discussed with Jenie, it may take 6 weeks before fluid from the treated ear infection results and at that time the hearing test should be repeated. However, because she is very concerned about the increase in tinnitus and reports continued unsteadiness/vertigo, further evaluation by an ENT is recommended at this time.  In addition, Ellie will need her hearing retested once normal middle ear function is restored bilaterally to rule out progressive hearing loss on the left side.  Please be aware that on the previous test Kelsi denied having tinnitus today but she did report that "loud sounds cause a migraine" so she tends to avoid loud areas.   RECOMMENDATIONS: 1. Consider referral to an ENT now. 2.   A new order will be needed to monitor hearing closely with a repeat audiological evaluation in 2-3 months (earlier if there is any change in  hearing or ear pressure) to measure inner ear function, tinnitus, elevated acoustic reflexes and hearing thresholds. This hearing evaluation may be completed here on at the ENT office.    Deborah L. Kate SableWoodward, Au.D., CCC-A Doctor of Audiology   cc: Tomi BambergerFULLER,SUSAN, NP

## 2015-06-04 ENCOUNTER — Telehealth: Payer: Self-pay | Admitting: *Deleted

## 2015-06-04 ENCOUNTER — Encounter: Payer: Self-pay | Admitting: *Deleted

## 2015-06-04 NOTE — Telephone Encounter (Signed)
Called pt to schedule Colpo, no answer, left message to call the office.

## 2015-06-26 ENCOUNTER — Encounter: Payer: Self-pay | Admitting: *Deleted

## 2015-06-26 ENCOUNTER — Ambulatory Visit (INDEPENDENT_AMBULATORY_CARE_PROVIDER_SITE_OTHER): Payer: Medicaid Other | Admitting: Obstetrics & Gynecology

## 2015-06-26 ENCOUNTER — Encounter: Payer: Self-pay | Admitting: Obstetrics & Gynecology

## 2015-06-26 VITALS — BP 107/72 | HR 128 | Resp 18 | Ht 66.0 in | Wt 282.0 lb

## 2015-06-26 DIAGNOSIS — N871 Moderate cervical dysplasia: Secondary | ICD-10-CM | POA: Diagnosis not present

## 2015-06-26 DIAGNOSIS — Z01812 Encounter for preprocedural laboratory examination: Secondary | ICD-10-CM | POA: Diagnosis not present

## 2015-06-26 LAB — POCT URINE PREGNANCY: PREG TEST UR: NEGATIVE

## 2015-06-26 NOTE — Progress Notes (Signed)
   Subjective:    Patient ID: Denise Conrad, female    DOB: 05/27/1990, 25 y.o.   MRN: [id number]  HPI 25 yo 36P1 (245 yo son) is here because she needs another colpo and probably a cryo. 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 Her ECC was negative. She did not keep her follow up appt. She had another pap smear recently with her FP that showed ASCUS + HR HPV.   Review of Systems     Objective:   Physical Exam Her colpo today is the same as the one 8/16, changes c/w CIN2 I discussed the findings and rec'd a cryo today. She was amenable to this. I applied the large cryo tip for 3 minutes.  She tolerated the procedure well.       Assessment & Plan:  CIN2- now s/p cryo RTC 6 months for pap smear with cotesting

## 2015-07-25 ENCOUNTER — Ambulatory Visit: Payer: Medicaid Other | Attending: Nurse Practitioner | Admitting: Physical Therapy

## 2015-07-25 DIAGNOSIS — M5440 Lumbago with sciatica, unspecified side: Secondary | ICD-10-CM | POA: Diagnosis not present

## 2015-07-25 DIAGNOSIS — M6281 Muscle weakness (generalized): Secondary | ICD-10-CM | POA: Insufficient documentation

## 2015-07-25 DIAGNOSIS — R293 Abnormal posture: Secondary | ICD-10-CM | POA: Diagnosis present

## 2015-07-25 NOTE — Patient Instructions (Addendum)
Posture Tips DO: - stand tall and erect - keep chin tucked in - keep head and shoulders in alignment - check posture regularly in mirror or large window - pull head back against headrest in car seat;  Change your position often.  Sit with lumbar support. DON'T: - slouch or slump while watching TV or reading - sit, stand or lie in one position  for too long;  Sitting is especially hard on the spine so if you sit at a desk/use the computer, then stand up often!   Copyright  VHI. All rights reserved.  Posture - Standing   Good posture is important. Avoid slouching and forward head thrust. Maintain curve in low back and align ears over shoul- ders, hips over ankles.  Pull your belly button in toward your back bone. Stand with even weight in heels and toes.  Stand with ribs lifted toward ceiling. Chin down.  Not titantic. Not miliatary   Copyright  VHI. All rights reserved.  Posture - Sitting   Sit upright, head facing forward. Try using a roll to support lower back. Keep shoulders relaxed, and avoid rounded back. Keep hips level with knees. Avoid crossing legs for long periods. Sit on your sit bones and not tail bone.     Copyright  VHI. All rights reserved.  Sleeping on Back  Place pillow under knees. A pillow with cervical support and a roll around waist are also helpful. Copyright  VHI. All rights reserved.  Sleeping on Side Place pillow between knees. Use cervical support under neck and a roll around waist as needed. Copyright  VHI. All rights reserved.   Sleeping on Stomach   If this is the only desirable sleeping position, place pillow under lower legs, and under stomach or chest as needed.  Posture - Sitting   Sit upright, head facing forward. Try using a roll to support lower back. Keep shoulders relaxed, and avoid rounded back. Keep hips level with knees. Avoid crossing legs for long periods. Stand to Sit / Sit to Stand   To sit: Bend knees to lower self onto front  edge of chair, then scoot back on seat. To stand: Reverse sequence by placing one foot forward, and scoot to front of seat. Use rocking motion to stand up.   Work Height and Reach  Ideal work height is no more than 2 to 4 inches below elbow level when standing, and at elbow level when sitting. Reaching should be limited to arm's length, with elbows slightly bent.  Bending  Bend at hips and knees, not back. Keep feet shoulder-width apart.    Posture - Standing   Good posture is important. Avoid slouching and forward head thrust. Maintain curve in low back and align ears over shoul- ders, hips over ankles.  Alternating Positions   Alternate tasks and change positions frequently to reduce fatigue and muscle tension. Take rest breaks. Computer Work   Position work to Art gallery managerface forward. Use proper work and seat height. Keep shoulders back and down, wrists straight, and elbows at right angles. Use chair that provides full back support. Add footrest and lumbar roll as needed.  Getting Into / Out of Car  Lower self onto seat, scoot back, then bring in one leg at a time. Reverse sequence to get out.  Dressing  Lie on back to pull socks or slacks over feet, or sit and bend leg while keeping back straight.    Housework - Sink  Place one foot on ledge of  cabinet under sink when standing at sink for prolonged periods.   Pushing / Pulling  Pushing is preferable to pulling. Keep back in proper alignment, and use leg muscles to do the work.  Deep Squat   Squat and lift with both arms held against upper trunk. Tighten stomach muscles without holding breath. Use smooth movements to avoid jerking.  Avoid Twisting   Avoid twisting or bending back. Pivot around using foot movements, and bend at knees if needed when reaching for articles.  Carrying Luggage   Distribute weight evenly on both sides. Use a cart whenever possible. Do not twist trunk. Move body as a unit.   Lifting  Principles .Maintain proper posture and head alignment. .Slide object as close as possible before lifting. .Move obstacles out of the way. .Test before lifting; ask for help if too heavy. .Tighten stomach muscles without holding breath. .Use smooth movements; do not jerk. .Use legs to do the work, and pivot with feet. .Distribute the work load symmetrically and close to the center of trunk. .Push instead of pull whenever possible.   Ask For Help   Ask for help and delegate to others when possible. Coordinate your movements when lifting together, and maintain the low back curve.  Log Roll   Lying on back, bend left knee and place left arm across chest. Roll all in one movement to the right. Reverse to roll to the left. Always move as one unit. Housework - Sweeping  Use long-handled equipment to avoid stooping.   Housework - Wiping  Position yourself as close as possible to reach work surface. Avoid straining your back.  Laundry - Unloading Wash   To unload small items at bottom of washer, lift leg opposite to arm being used to reach.  Gardening - Raking  Move close to area to be raked. Use arm movements to do the work. Keep back straight and avoid twisting.     Cart  When reaching into cart with one arm, lift opposite leg to keep back straight.   Getting Into / Out of Bed  Lower self to lie down on one side by raising legs and lowering head at the same time. Use arms to assist moving without twisting. Bend both knees to roll onto back if desired. To sit up, start from lying on side, and use same move-ments in reverse. Housework - Vacuuming  Hold the vacuum with arm held at side. Step back and forth to move it, keeping head up. Avoid twisting.   Laundry - Armed forces training and education officerLoading Wash  Position laundry basket so that bending and twisting can be avoided.   Laundry - Unloading Dryer  Squat down to reach into clothes dryer or use a reacher.  Gardening - Weeding /  Psychiatric nurselanting  Squat or Kneel. Knee pads may be helpful.                    Pt given handout on basic back exericises. Pt went over specifics in handout   Denise LahLawrie Tinita Conrad, PT 07/25/2015 12:11 PM Phone: [phone] Fax: [phone] Bridge   Lie back, legs bent. Inhale, pressing hips up. Keeping ribs in, lengthen lower back. Exhale, rolling down along spine from top. Repeat _10___ times. Do _1-2___ sessions per day.  Copyright  VHI. All rights reserved.   Pelvic Tilt   Flatten back by tightening stomach muscles and buttocks. Repeat 5____ times per set. Do __1__ sets per session. Do __1-2__ sessions per day.  http://orth.exer.us/134   Copyright  VHI.  All rights reserved. Knee to Chest (Flexion)   Pull knee toward chest. Feel stretch in lower back or buttock area. Breathing deeply, Hold _3-5__ seconds. Repeat with other knee. Repeat __10__ times. Do _1-2___ sessions per day.  http://gt2.exer.us/225   Copyright  VHI. All rights reserved.   Lower Trunk Rotation Stretch   Keeping back flat and feet together, rotate knees to left side. Hold _30___ seconds. Repeat _2-3___ times per set. Do __1__ sets per session. Do _1___ sessions per day.  http://orth.exer.us/122   Copyright  VHI. All rights reserved.  Supine: Leg Stretch With Strap (Basic)   Lie on back with one knee bent, foot flat on floor. Hook strap around other foot. Straighten knee. Keep knee level with other knee. Hold _30__ seconds. Relax leg completely down to floor.  Repeat 2-3___ times per session. Do __1-2_ sessions per day.  Gave handout on child's pose. [facility].      Denise Conrad, PT 07/25/2015 12:14 PM Phone: [phone] Fax: [phone]      .

## 2015-07-25 NOTE — Therapy (Addendum)
[facility] [address] [place], [place], [address] Phone: [phone]   Fax:  [phone]  Physical Therapy Evaluation/Discharge Note/ one time evaluation  Patient Details  Name: Denise Conrad MRN: [id number] Date of Birth: 03/17/1990 Referring Provider: Jackelyn HoehnNundkumar, Neelesh C MD  Encounter Date: 07/25/2015      PT End of Session - 07/25/15 1303    Visit Number 1   Number of Visits 1   Date for PT Re-Evaluation 07/25/15   Authorization Type Medicaid   PT Start Time 1150   PT Stop Time 1240   PT Time Calculation (min) 50 min   Activity Tolerance Patient tolerated treatment well   Behavior During Therapy [facility] for tasks assessed/performed      Past Medical History  Diagnosis Date  . Depression   . Anxiety   . Bipolar 1 disorder [facility](HCC)     Past Surgical History  Procedure Laterality Date  . Wisdom tooth extraction      There were no vitals filed for this visit.       Subjective Assessment - 07/25/15 1155    Subjective I am in pain all the time.  It is more at night . from back of head down to hips. My pain hurts so bad that I am nauseated   Pertinent History chronic bilateral back pain, bipolar disorder   Limitations House hold activities  lying down   How long can you sit comfortably? 10 min   How long can you stand comfortably? 20 minutes   How long can you walk comfortably? 20 minutes   Diagnostic tests xrays as reported by pt.   Patient Stated Goals Learn to care for my back   Currently in Pain? Yes   Pain Score 7   can shoot up to 10/10   Pain Location Back   Pain Orientation Right;Left;Lower   Pain Descriptors / Indicators Throbbing;Aching   Pain Type Chronic pain   Pain Onset More than a month ago   Pain Frequency Constant   Aggravating Factors  try to clean , bend over, laundry    Pain Relieving Factors resting.  using pillow between your legs.   Multiple Pain Sites Yes   Pain Score 7   Pain  Location Neck   Pain Orientation Right;Left  base of head and bil paraspinals   Pain Descriptors / Indicators Aching;Throbbing   Pain Onset More than a month ago   Pain Frequency Constant            [facility] PT Assessment - 07/25/15 1202    Assessment   Medical Diagnosis bilateral low back pain without sciatica   Referring Provider Jackelyn HoehnNundkumar, Neelesh C MD   Onset Date/Surgical Date 04/05/09  after birth of son   Hand Dominance Right   Prior Therapy none   only chiropractic   Precautions   Precautions None   Restrictions   Weight Bearing Restrictions No   Balance Screen   Has the patient fallen in the past 6 months Yes   How many times? 2  up the stairs tripped, tripped over basket with kid toys   Has the patient had a decrease in activity level because of a fear of falling?  Yes   Is the patient reluctant to leave their home because of a fear of falling?  Yes   Home Environment   Living Environment Private residence   Living Arrangements Children;Other relatives  brother   Type of Home House   Home  Access Stairs to enter   Entrance Stairs-Number of Steps 6   Entrance Stairs-Rails Right   Home Layout One level   Cognition   Overall Cognitive Status --   Observation/Other Assessments   Observations Pt with high level of anxiety   Posture/Postural Control   Posture/Postural Control Postural limitations   Postural Limitations Rounded Shoulders;Forward head;Increased lumbar lordosis;Increased thoracic kyphosis;Anterior pelvic tilt   Posture Comments Pt has had back pain since delivery or son 6 years ago.  Decreased Core strength.   AROM   Lumbar Flexion 80   Lumbar Extension 18   Lumbar - Right Side Bend 26   Lumbar - Left Side Bend 26   Lumbar - Right Rotation 90%   Lumbar - Left Rotation 90%   Strength   Overall Strength Deficits   Overall Strength Comments Grossly 4-/5 in bil LE's Abdominals 3-/5    Flexibility   Hamstrings bil 65 degrees.    Palpation    Palpation comment Pt with marked hyper sensitivity bil low back/ lumbar area, no radiation. to LE's       Self care and Therapeutic exericise.   Pt informed of [facility]   Self care with handouts for ADl's , posture and body mechanics.  Pt states she often bends forward the wrong way.  ( Extended lever arm) and was educated that was not healthy for back . Need for walking program  Marjo Bicker pose 30 sec hold x 2 Hamstring with strap hold bil 30 sec each.  Pt with pain at 65 degrees hip flex.  Bridging 2 x 10 and shown how to progress to single limb bridge when she is stronger. Trunk rotation 30 sec x 2 to right and left Knee to chest 5 x right and left 3-5 sec hold Pelvic tilt 5 x  Sitting and standing posture and positioning for bed                   PT Education - 07/25/15 1240    Education provided Yes   Education Details Explanation of findings- Decreased Core strength.  Posture, body mechanics, given initial HEP and information on [facility].   Person(s) Educated Patient   Methods Explanation;Demonstration;Tactile cues;Verbal cues   Comprehension Verbalized understanding;Returned demonstration                    Plan - 07/25/15 1412    Clinical Impression Statement 25 yo pt with 71 year old son with bil low back pain and bipolar disorder presents with complaint of low back and neck pain.with low complexity.  Pt states she has has low back pain since dielvery or son 6 years ago .  Deficits include decreased core strength.  decreased LE strength, postural dysfunction. Decreased AROM.  Due to Medicaid limitations, pt is allowed only one visit but was given information on Posture, ADL's body mechanics and initial HEP and information regarding [facility] a pro bono PT clinic in [place] near her home in [place] .Marland Kitchen    PT Home Exercise Plan initial HEP for basic back, Body mechanics and Posture informaion and [facility]      Patient will benefit from  skilled therapeutic intervention in order to improve the following deficits and impairments:     Visit Diagnosis: Midline low back pain with sciatica, sciatica laterality unspecified  Abnormal posture  Muscle weakness (generalized)     Problem List Patient Active Problem List   Diagnosis Date Noted  . Acute renal failure (  HCC) 11/22/2014  . Bipolar 1 disorder (HCC) 11/20/2014  . Anxiety 11/20/2014  . Sepsis (HCC) 11/20/2014  . Acute suppurative tonsillitis 11/20/2014  . Pap smear of cervix with ASCUS, cannot exclude HGSIL 09/28/2014    Garen LahLawrie Tami Blass, PT 07/25/2015 2:26 PM Phone: [phone] Fax: [phone]  [facility] [address] [place], [place], [address] Phone: [phone]   Fax:  [phone]  Name: Denise Conrad MRN: [id number] Date of Birth: 02/11/1990

## 2015-07-26 ENCOUNTER — Other Ambulatory Visit: Payer: Self-pay | Admitting: Otolaryngology

## 2015-08-19 ENCOUNTER — Encounter (HOSPITAL_COMMUNITY): Payer: Self-pay | Admitting: *Deleted

## 2015-08-19 ENCOUNTER — Inpatient Hospital Stay (HOSPITAL_COMMUNITY)
Admission: AD | Admit: 2015-08-19 | Discharge: 2015-08-22 | DRG: 885 | Disposition: A | Discharge status: Home / Self Care | Payer: Medicaid Other | Source: Intra-hospital | Attending: Psychiatry | Admitting: Psychiatry

## 2015-08-19 ENCOUNTER — Emergency Department (HOSPITAL_COMMUNITY)
Admission: EM | Admit: 2015-08-19 | Discharge: 2015-08-19 | Disposition: A | Discharge status: Other Acute Inpatient Hospital | Payer: Medicaid Other | Attending: Emergency Medicine | Admitting: Emergency Medicine

## 2015-08-19 ENCOUNTER — Encounter (HOSPITAL_COMMUNITY): Payer: Self-pay | Admitting: Emergency Medicine

## 2015-08-19 DIAGNOSIS — B9689 Other specified bacterial agents as the cause of diseases classified elsewhere: Secondary | ICD-10-CM

## 2015-08-19 DIAGNOSIS — F3160 Bipolar disorder, current episode mixed, unspecified: Secondary | ICD-10-CM | POA: Diagnosis not present

## 2015-08-19 DIAGNOSIS — Z8249 Family history of ischemic heart disease and other diseases of the circulatory system: Secondary | ICD-10-CM

## 2015-08-19 DIAGNOSIS — R45851 Suicidal ideations: Secondary | ICD-10-CM

## 2015-08-19 DIAGNOSIS — G4700 Insomnia, unspecified: Secondary | ICD-10-CM | POA: Diagnosis present

## 2015-08-19 DIAGNOSIS — F4310 Post-traumatic stress disorder, unspecified: Secondary | ICD-10-CM | POA: Diagnosis present

## 2015-08-19 DIAGNOSIS — F314 Bipolar disorder, current episode depressed, severe, without psychotic features: Secondary | ICD-10-CM | POA: Diagnosis present

## 2015-08-19 DIAGNOSIS — E781 Pure hyperglyceridemia: Secondary | ICD-10-CM | POA: Diagnosis present

## 2015-08-19 DIAGNOSIS — Z882 Allergy status to sulfonamides status: Secondary | ICD-10-CM | POA: Diagnosis not present

## 2015-08-19 DIAGNOSIS — Z7251 High risk heterosexual behavior: Secondary | ICD-10-CM | POA: Insufficient documentation

## 2015-08-19 DIAGNOSIS — N760 Acute vaginitis: Secondary | ICD-10-CM | POA: Insufficient documentation

## 2015-08-19 DIAGNOSIS — F4001 Agoraphobia with panic disorder: Secondary | ICD-10-CM | POA: Diagnosis present

## 2015-08-19 DIAGNOSIS — Z818 Family history of other mental and behavioral disorders: Secondary | ICD-10-CM | POA: Diagnosis not present

## 2015-08-19 DIAGNOSIS — Z915 Personal history of self-harm: Secondary | ICD-10-CM

## 2015-08-19 DIAGNOSIS — F909 Attention-deficit hyperactivity disorder, unspecified type: Secondary | ICD-10-CM | POA: Diagnosis present

## 2015-08-19 DIAGNOSIS — Z79899 Other long term (current) drug therapy: Secondary | ICD-10-CM | POA: Insufficient documentation

## 2015-08-19 DIAGNOSIS — F419 Anxiety disorder, unspecified: Secondary | ICD-10-CM | POA: Diagnosis present

## 2015-08-19 DIAGNOSIS — Z3202 Encounter for pregnancy test, result negative: Secondary | ICD-10-CM | POA: Diagnosis present

## 2015-08-19 LAB — COMPREHENSIVE METABOLIC PANEL
ALT: 31 U/L (ref 14–54)
AST: 29 U/L (ref 15–41)
Albumin: 3.6 g/dL (ref 3.5–5.0)
Alkaline Phosphatase: 59 U/L (ref 38–126)
Anion gap: 8 (ref 5–15)
BUN: 11 mg/dL (ref 6–20)
CO2: 24 mmol/L (ref 22–32)
Calcium: 9.5 mg/dL (ref 8.9–10.3)
Chloride: 103 mmol/L (ref 101–111)
Creatinine, Ser: 0.72 mg/dL (ref 0.44–1.00)
GFR calc Af Amer: >60 mL/min (ref >60)
GFR calc non Af Amer: >60 mL/min (ref >60)
Glucose, Bld: 103 mg/dL — ABNORMAL HIGH (ref 65–99)
Potassium: 3.5 mmol/L (ref 3.5–5.1)
Sodium: 135 mmol/L (ref 135–145)
Total Bilirubin: 0.3 mg/dL (ref 0.3–1.2)
Total Protein: 7.8 g/dL (ref 6.5–8.1)

## 2015-08-19 LAB — RAPID URINE DRUG SCREEN, HOSP PERFORMED
Amphetamines: NOT DETECTED
Barbiturates: NOT DETECTED
Benzodiazepines: NOT DETECTED
Cocaine: NOT DETECTED
Opiates: NOT DETECTED
Tetrahydrocannabinol: NOT DETECTED

## 2015-08-19 LAB — URINALYSIS, ROUTINE W REFLEX MICROSCOPIC
Bilirubin Urine: NEGATIVE
Glucose, UA: NEGATIVE mg/dL
Hgb urine dipstick: NEGATIVE
KETONES UR: NEGATIVE mg/dL
NITRITE: NEGATIVE
Protein, ur: NEGATIVE mg/dL
SPECIFIC GRAVITY, URINE: 1.016 (ref 1.005–1.030)
pH: 6.5 (ref 5.0–8.0)

## 2015-08-19 LAB — CBC
HCT: 37.3 % (ref 36.0–46.0)
Hemoglobin: 12.2 g/dL (ref 12.0–15.0)
MCH: 27.3 pg (ref 26.0–34.0)
MCHC: 32.7 g/dL (ref 30.0–36.0)
MCV: 83.4 fL (ref 78.0–100.0)
Platelets: 536 10*3/uL — ABNORMAL HIGH (ref 150–400)
RBC: 4.47 MIL/uL (ref 3.87–5.11)
RDW: 15 % (ref 11.5–15.5)
WBC: 11.2 10*3/uL — ABNORMAL HIGH (ref 4.0–10.5)

## 2015-08-19 LAB — URINE MICROSCOPIC-ADD ON: RBC / HPF: NONE SEEN RBC/hpf (ref 0–5)

## 2015-08-19 LAB — ETHANOL: Alcohol, Ethyl (B): <5 mg/dL (ref <5)

## 2015-08-19 LAB — I-STAT BETA HCG BLOOD, ED (MC, WL, AP ONLY): I-stat hCG, quantitative: <5 m[IU]/mL (ref <5)

## 2015-08-19 LAB — WET PREP, GENITAL
SPERM: NONE SEEN
TRICH WET PREP: NONE SEEN
Yeast Wet Prep HPF POC: NONE SEEN

## 2015-08-19 LAB — ACETAMINOPHEN LEVEL: Acetaminophen (Tylenol), Serum: <10 ug/mL — ABNORMAL LOW (ref 10–30)

## 2015-08-19 LAB — SALICYLATE LEVEL: Salicylate Lvl: <4 mg/dL (ref 2.8–30.0)

## 2015-08-19 MED ORDER — FESOTERODINE FUMARATE ER 4 MG PO TB24
4.0000 mg | ORAL_TABLET | Freq: Every day | ORAL | Status: DC
  Administered 2015-08-19: 4 mg via ORAL
  Filled 2015-08-19: qty 1

## 2015-08-19 MED ORDER — NICOTINE 21 MG/24HR TD PT24: 21.0000 mg | MEDICATED_PATCH | Freq: Every day | TRANSDERMAL | Status: DC

## 2015-08-19 MED ORDER — CLONAZEPAM 1 MG PO TABS: 1.0000 mg | ORAL_TABLET | Freq: Two times a day (BID) | ORAL | Status: DC | PRN

## 2015-08-19 MED ORDER — LORATADINE 10 MG PO TABS: 10.0000 mg | ORAL_TABLET | Freq: Every day | ORAL | Status: DC

## 2015-08-19 MED ORDER — LAMOTRIGINE 100 MG PO TABS
150.0000 mg | ORAL_TABLET | Freq: Two times a day (BID) | ORAL | Status: DC
  Administered 2015-08-20 – 2015-08-22 (×5): 150 mg via ORAL
  Filled 2015-08-19 (×2): qty 1
  Filled 2015-08-19: qty 21
  Filled 2015-08-19 (×4): qty 1
  Filled 2015-08-19: qty 21
  Filled 2015-08-19: qty 1

## 2015-08-19 MED ORDER — ALUM & MAG HYDROXIDE-SIMETH 200-200-20 MG/5ML PO SUSP: 30.0000 mL | ORAL | Status: DC | PRN

## 2015-08-19 MED ORDER — FESOTERODINE FUMARATE ER 4 MG PO TB24
4.0000 mg | ORAL_TABLET | Freq: Every day | ORAL | Status: DC
  Administered 2015-08-20 – 2015-08-21 (×2): 4 mg via ORAL
  Filled 2015-08-19 (×5): qty 1

## 2015-08-19 MED ORDER — NORGESTIM-ETH ESTRAD TRIPHASIC 0.18/0.215/0.25 MG-35 MCG PO TABS: 1.0000 | ORAL_TABLET | Freq: Every day | ORAL | Status: DC

## 2015-08-19 MED ORDER — OLANZAPINE 5 MG PO TABS
5.0000 mg | ORAL_TABLET | Freq: Every day | ORAL | Status: DC
  Administered 2015-08-20 – 2015-08-21 (×2): 5 mg via ORAL
  Filled 2015-08-19 (×2): qty 1
  Filled 2015-08-19: qty 7
  Filled 2015-08-19 (×2): qty 1

## 2015-08-19 MED ORDER — LISDEXAMFETAMINE DIMESYLATE 30 MG PO CAPS
50.0000 mg | ORAL_CAPSULE | Freq: Every day | ORAL | Status: DC
  Administered 2015-08-20 – 2015-08-22 (×3): 50 mg via ORAL
  Filled 2015-08-19 (×3): qty 1

## 2015-08-19 MED ORDER — LORATADINE 10 MG PO TABS
10.0000 mg | ORAL_TABLET | Freq: Every day | ORAL | Status: DC
  Administered 2015-08-20 – 2015-08-22 (×3): 10 mg via ORAL
  Filled 2015-08-19 (×5): qty 1

## 2015-08-19 MED ORDER — LORAZEPAM 1 MG PO TABS: 1.0000 mg | ORAL_TABLET | Freq: Three times a day (TID) | ORAL | Status: DC | PRN

## 2015-08-19 MED ORDER — MAGNESIUM HYDROXIDE 400 MG/5ML PO SUSP: 30.0000 mL | Freq: Every day | ORAL | Status: DC | PRN

## 2015-08-19 MED ORDER — DULOXETINE HCL 60 MG PO CPEP
60.0000 mg | ORAL_CAPSULE | Freq: Every morning | ORAL | Status: DC
  Administered 2015-08-20 – 2015-08-22 (×3): 60 mg via ORAL
  Filled 2015-08-19 (×3): qty 1
  Filled 2015-08-19: qty 7
  Filled 2015-08-19: qty 1

## 2015-08-19 MED ORDER — ACETAMINOPHEN 325 MG PO TABS
650.0000 mg | ORAL_TABLET | Freq: Four times a day (QID) | ORAL | Status: DC | PRN
Start: 2015-08-19 — End: 2015-08-22

## 2015-08-19 MED ORDER — LISDEXAMFETAMINE DIMESYLATE 50 MG PO CAPS
50.0000 mg | ORAL_CAPSULE | Freq: Every day | ORAL | Status: DC
  Filled 2015-08-19: qty 1

## 2015-08-19 MED ORDER — METRONIDAZOLE 500 MG PO TABS
500.0000 mg | ORAL_TABLET | Freq: Two times a day (BID) | ORAL | Status: DC
  Administered 2015-08-19 (×2): 500 mg via ORAL
  Filled 2015-08-19 (×2): qty 1

## 2015-08-19 MED ORDER — DULOXETINE HCL 60 MG PO CPEP: 60.0000 mg | ORAL_CAPSULE | Freq: Every day | ORAL | Status: DC

## 2015-08-19 MED ORDER — ONDANSETRON HCL 4 MG PO TABS: 4.0000 mg | ORAL_TABLET | Freq: Three times a day (TID) | ORAL | Status: DC | PRN

## 2015-08-19 MED ORDER — ACETAMINOPHEN 325 MG PO TABS: 650.0000 mg | ORAL_TABLET | ORAL | Status: DC | PRN

## 2015-08-19 MED ORDER — IBUPROFEN 200 MG PO TABS: 600.0000 mg | ORAL_TABLET | Freq: Three times a day (TID) | ORAL | Status: DC | PRN

## 2015-08-19 MED ORDER — ZOLPIDEM TARTRATE 5 MG PO TABS: 5.0000 mg | ORAL_TABLET | Freq: Every evening | ORAL | Status: DC | PRN

## 2015-08-19 MED ORDER — LAMOTRIGINE 150 MG PO TABS
150.0000 mg | ORAL_TABLET | Freq: Two times a day (BID) | ORAL | Status: DC
  Administered 2015-08-19: 150 mg via ORAL
  Filled 2015-08-19: qty 1

## 2015-08-19 NOTE — Tx Team (Signed)
Initial Interdisciplinary Treatment Plan   PATIENT STRESSORS: Medication change or noncompliance   PATIENT STRENGTHS: Ability for insight Average or above average intelligence Capable of independent living General fund of knowledge Motivation for treatment/growth   PROBLEM LIST: Problem List/Patient Goals Date to be addressed Date deferred Reason deferred Estimated date of resolution  Depression 08/19/15     Suicidal thoughts 08/19/15     " I need to get my medications straight" 08/19/15                                          DISCHARGE CRITERIA:  Ability to meet basic life and health needs Improved stabilization in mood, thinking, and/or behavior Verbal commitment to aftercare and medication compliance  PRELIMINARY DISCHARGE PLAN: Attend aftercare/continuing care group Return to previous living arrangement  PATIENT/FAMIILY INVOLVEMENT: This treatment plan has been presented to and reviewed with the patient, Denise Conrad, and/or family member, .  The patient and family have been given the opportunity to ask questions and make suggestions.  Kail Fraley, [place] 08/19/2015, 11:37 PM

## 2015-08-19 NOTE — ED Notes (Signed)
Report to [facility], pending transfer to [facility] at 10:30pm

## 2015-08-19 NOTE — ED Triage Notes (Signed)
Patient states that she was off her medication in June for couple weeks for surgery removing her tonsils.  Patient states that she has been having thoughts of SI with plan of running car off road.  Patient states that several times she has gotten her car up to on highway.  Patient's mother states that she is going on line and meeting men at hotels or her house to have sex.  Patient therapist insisted she come in to ED so she can get inpatient treatment.
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
## 2015-08-19 NOTE — ED Notes (Signed)
Pt oriented to room and unit.  Pt is calm and cooperative.  She contracts for safety.  Pt denies H/I and AVH.  15 minute checks and video monitoring in place.
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
## 2015-08-19 NOTE — BH Assessment (Signed)
[facility] Assessment Progress Note   Patient was accepted to [facility] 400-2 at 20:00 hrs. Voluntary paperwork to be completed.

## 2015-08-19 NOTE — ED Notes (Signed)
Pt resting on stretcher at present, AAO x 3, no distress noted. Calm & cooperative,  Monitoring for safety, Q 15 min checks in effect.  Pending report & transfer to [facility].

## 2015-08-19 NOTE — Progress Notes (Signed)
25 year old female pt admitted on voluntary basis. Laquishia reports that in June she had a tonsillectomy and was not able to swallow and was unable to take any of her medications for 9 days. Jhoana reports that during that time she became depressed and feeling suicidal, however she reports that she is back on her medications now but reports that she is still not feeling quite right and needed to come in to get her medications straight. Pt is able to contract for safety in the hospital, she was oriented to the unit and safety maintained.

## 2015-08-20 ENCOUNTER — Encounter (HOSPITAL_COMMUNITY): Payer: Self-pay | Admitting: Psychiatry

## 2015-08-20 DIAGNOSIS — F3160 Bipolar disorder, current episode mixed, unspecified: Secondary | ICD-10-CM

## 2015-08-20 LAB — LIPID PANEL
CHOLESTEROL: 208 mg/dL — AB (ref 0–200)
HDL: 58 mg/dL (ref >40)
LDL Cholesterol: 108 mg/dL — ABNORMAL HIGH (ref 0–99)
Total CHOL/HDL Ratio: 3.6 RATIO
Triglycerides: 212 mg/dL — ABNORMAL HIGH (ref <150)
VLDL: 42 mg/dL — ABNORMAL HIGH (ref 0–40)

## 2015-08-20 LAB — TSH: TSH: 3.976 u[IU]/mL (ref 0.350–4.500)

## 2015-08-20 LAB — GC/CHLAMYDIA PROBE AMP (~~LOC~~) NOT AT ARMC
Chlamydia: NEGATIVE
NEISSERIA GONORRHEA: NEGATIVE

## 2015-08-20 MED ORDER — IBUPROFEN 600 MG PO TABS
600.0000 mg | ORAL_TABLET | Freq: Four times a day (QID) | ORAL | Status: DC | PRN
  Administered 2015-08-20 – 2015-08-21 (×2): 600 mg via ORAL
  Filled 2015-08-20 (×2): qty 1

## 2015-08-20 NOTE — [facility] Group Notes (Signed)
[facility] Group Notes:  (Nursing/MHT/Case Management/Adjunct)  Date:  08/20/2015  Time:  1:06 PM Type of Therapy:  Psychoeducational Skills  Participation Level:  Active  Participation Quality:  Appropriate  Affect:  Appropriate  Cognitive:  Appropriate  Insight:  Appropriate  Engagement in Group:  Engaged  Modes of Intervention:  Problem-solving  Summary of Progress/Problems:  Topic was on discovery.  Group encouraged to surround themselves with positive and healthy group/support system when changing to a healthy life style.     Bethann Punches 08/20/2015, 1:06 PM

## 2015-08-20 NOTE — [facility] Group Notes (Signed)
Pt attended spiritual care group on grief and loss facilitated by chaplain Evelynn Hench   Group opened with brief discussion and psycho-social ed around grief and loss.  Group identifying life patterns, circumstances, changes connected to loss in relationship and in relation to self. Established group norm of speaking from own life experience. Group goal of establishing open and affirming space for members to share loss and experience with grief, normalize grief experience and provide psycho social education and grief support. Group facilitation draws on narrative, Adlerian, and brief CBT 

## 2015-08-20 NOTE — Progress Notes (Signed)
Patient ID: Denise Conrad, female   DOB: [birth year], 26 y.o.   MRN: [id number] Received report from [facility]. RN.  D: Client in bed eyes closed, respirations even. A: Writer introduced self to client, encouraged her to report any concerns. Staff will monitor q22min for safety. R: Client is safe on the unit, refused sleep medications.

## 2015-08-20 NOTE — BHH Suicide Risk Assessment (Signed)
[facility] Admission Suicide Risk Assessment   Nursing information obtained from:   patient and chart  Demographic factors:   25 year old single female, unemployed, lives with family Current Mental Status:   see below  Loss Factors:   history of bipolar disorder, had stopped medications following surgery  Historical Factors:   history of bipolar disorder  Risk Reduction Factors:   resilience, sense of responsibility to family   Total Time spent with patient: 45 minutes Principal Problem: Bipolar Disorder, Mixed Diagnosis:   Patient Active Problem List   Diagnosis Date Noted  . Bipolar 1 disorder, depressed, severe (HCC) [F31.4] 08/19/2015  . Acute renal failure (HCC) [N17.9] 11/22/2014  . Bipolar 1 disorder (HCC) [F31.9] 11/20/2014  . Anxiety [F41.9] 11/20/2014  . Sepsis (HCC) [A41.9] 11/20/2014  . Acute suppurative tonsillitis [J03.90] 11/20/2014  . Pap smear of cervix with ASCUS, cannot exclude HGSIL [R87.611] 09/28/2014     Continued Clinical Symptoms:  Alcohol Use Disorder Identification Test Final Score (AUDIT): 0 The "Alcohol Use Disorders Identification Test", Guidelines for Use in Primary Care, Second Edition.  World Science writer [facility]). Score between 0-7:  no or low risk or alcohol related problems. Score between 8-15:  moderate risk of alcohol related problems. Score between 16-19:  high risk of alcohol related problems. Score 20 or above:  warrants further diagnostic evaluation for alcohol dependence and treatment.   CLINICAL FACTORS:   25 year old female, reports history of Bipolar Disorder. Recently had stopped taking her psychiatric medications following an elective tonsillectomy which caused significant pain swallowing on the days after surgery and due to which she stopped taking the psychiatric medications . She reports she developed symptoms of mania , including hypersexuality, racing thoughts, but also suicidal ideations. Now back on psychiatric medications and  states she feels she is stabilizing      Psychiatric Specialty Exam: Physical Exam  ROS  Blood pressure (!) 118/96, pulse (!) 131, temperature 98.5 F (36.9 C), temperature source Oral, resp. rate 18, height 5\' 6"  (1.676 m), weight 298 lb (135.2 kg).Body mass index is 48.1 kg/m.   see admit note MSE    COGNITIVE FEATURES THAT CONTRIBUTE TO RISK:  Closed-mindedness and Loss of executive function    SUICIDE RISK:   Moderate:  Frequent suicidal ideation with limited intensity, and duration, some specificity in terms of plans, no associated intent, good self-control, limited dysphoria/symptomatology, some risk factors present, and identifiable protective factors, including available and accessible social support.   PLAN OF CARE: Patient will be admitted to inpatient psychiatric unit for stabilization and safety. Will provide and encourage milieu participation. Provide medication management and maked adjustments as needed.  Will follow daily.    I certify that inpatient services furnished can reasonably be expected to improve the patient's condition.  Nehemiah Massed, MD 08/20/2015, 12:06 PM
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
## 2015-08-20 NOTE — Progress Notes (Signed)
Patient ID: Denise Conrad, female   DOB: 1990/08/31, 25 y.o.   MRN: [id number] D: Client reports depression "2" of 10. Client seen interacting with peers in dayroom, watching TV. Client asking for an anti-inflammatory for chronic back pain. "I was on it at home" A: Writer provided emotional support, reviewed home medications found "diclofenac 50 mg" Information given to PA, Simon received orders for NSAID, Ibuprofen 600 mg. Medications reviewed and administered. Staff will monitor q18min for safety. R: Client is safe on the unit, attended group.

## 2015-08-20 NOTE — Progress Notes (Signed)
DAR NOTE: Patient presents with anxious affect and depressed mood.  Denies pain, auditory and visual hallucinations.  Rates depression at 2, hopelessness at 0, and anxiety at 4.  Maintained on routine safety checks.  Medications given as prescribed.  Support and encouragement offered as needed.  Attended group and participated.  States goal for today is "discharge".  Patient observed socializing with peers in the dayroom.  Offered no complaint.

## 2015-08-20 NOTE — Tx Team (Addendum)
Interdisciplinary Treatment Plan Update (Adult) Date: 08/20/2015    Time Reviewed: 9:30 AM  Progress in Treatment: Attending groups: Yes  Participating in groups: Yes Taking medication as prescribed: Yes Tolerating medication: Yes Family/Significant other contact made: Yes, CSW has spoken with mother  Patient understands diagnosis: Yes Discussing patient identified problems/goals with staff: Yes Medical problems stabilized or resolved: Yes Denies suicidal/homicidal ideation: Yes Issues/concerns per patient self-inventory: Yes Other:  New problem(s) identified: N/A  Discharge Plan or Barriers: Home with outpatient services  Reason for Continuation of Hospitalization:  Depression Anxiety Medication Stabilization   Comments: N/A  Estimated length of stay: Discharge anticipated for 08/22/15    Patient is a 25 year old female with a diagnosis of Bipolar Disorder. Pt presented to the hospital with SI and increased anxiety. Pt reports primary trigger(s) for admission was medication noncompliance. Patient will benefit from crisis stabilization, medication evaluation, group therapy and psycho education in addition to case management for discharge planning. At discharge, it is recommended that Pt remain compliant with established discharge plan and continued treatment.   Review of initial/current patient goals per problem list:  1. Goal(s): Patient will participate in aftercare plan   Met: Yes   Target date: 3-5 days post admission date   As evidenced by: Patient will participate within aftercare plan AEB aftercare provider and housing plan at discharge being identified.  7/25: Goal met. Home with outpatient services.    2. Goal (s): Patient will exhibit decreased depressive symptoms and suicidal ideations.   Met: Yes   Target date: 3-5 days post admission date   As evidenced by: Patient will utilize self rating of depression at 3 or below and demonstrate decreased  signs of depression or be deemed stable for discharge by MD.  7/25: Goal progressing. Patient admitted with passive SI.   7/26: Goal met. Patient reports little to no depression, denies SI.    3. Goal(s): Patient will demonstrate decreased signs and symptoms of anxiety.   Met: Yes   Target date: 3-5 days post admission date   As evidenced by: Patient will utilize self rating of anxiety at 3 or below and demonstrated decreased signs of anxiety, or be deemed stable for discharge by MD   7/25: Goal progressing. Patient admitted with high anxiety levels.  7/27: Goal met. Patient reports little to no anxiety, reports feeling safe for discharge.    Attendees: Patient:   08/20/2015    Family:   08/20/2015   Physician:  Dr. Parke Poisson MD  08/20/2015   Nursing: Kerby Nora, Eulogio Bear, Mayra Neer; RN 08/20/2015   Clinical Social Worker: Maxie Better, LCSW 08/20/2015   Clinical Social Worker: Erasmo Downer Marlisa Caridi LCSW; Peri Maris LCSW 08/20/2015   Other:  Gerline Legacy Nurse Case Manager 08/20/2015   Other:  Agustina Caroli NP  08/20/2015     Scribe for Treatment Team:  Tilden Fossa, [place]

## 2015-08-20 NOTE — Progress Notes (Signed)
Adult Psychoeducational Group Note  Date:  08/20/2015 Time:  9:26 PM  Group Topic/Focus:  Wrap-Up Group:   The focus of this group is to help patients review their daily goal of treatment and discuss progress on daily workbooks.   Participation Level:  Active  Participation Quality:  Appropriate  Affect:  Appropriate  Cognitive:  Alert  Insight: Appropriate  Engagement in Group:  Engaged  Modes of Intervention:  Discussion  Additional Comments:  Patient states, "I had a good day". Patient's goal for today was "to stay out of my room".  Denise Conrad 08/20/2015, 9:26 PM
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
## 2015-08-20 NOTE — [facility] Group Notes (Signed)
[facility] LCSW Group Therapy 08/20/2015 1:15 PM Type of Therapy: Group Therapy Participation Level: Active  Participation Quality: Attentive, Sharing and Supportive  Affect: Appropriate  Cognitive: Alert and Oriented  Insight: Developing/Improving and Engaged  Engagement in Therapy: Developing/Improving and Engaged  Modes of Intervention: Activity, Clarification, Confrontation, Discussion, Education, Exploration, Limit-setting, Orientation, Problem-solving, Rapport Building, Dance movement psychotherapist, Socialization and Support  Summary of Progress/Problems: Patient was attentive and engaged with speaker from [employer]. Patient was attentive to speaker while they shared their story of dealing with mental health and overcoming it. Patient expressed interest in their programs and services and received information on their agency. Patient processed ways they can relate to the speaker.   Samuella Bruin, LCSW Clinical Social Worker [facility] [phone]

## 2015-08-21 LAB — HIV ANTIBODY (ROUTINE TESTING W REFLEX): HIV Screen 4th Generation wRfx: NONREACTIVE

## 2015-08-21 LAB — RPR: RPR Ser Ql: NONREACTIVE

## 2015-08-21 LAB — PROLACTIN: Prolactin: 68.9 ng/mL — ABNORMAL HIGH (ref 4.8–23.3)

## 2015-08-21 NOTE — Progress Notes (Signed)
Recreation Therapy Notes  Date: 08/21/15 Time: 0930 Location: [address] Group Room  Group Topic: Stress Management  Goal Area(s) Addresses:  Patient will verbalize importance of using healthy stress management.  Patient will identify positive emotions associated with healthy stress management.   Intervention: Stress Management  Activity :  Progressive Muscle Relaxation.  LRT introduced the technique of progressive muscle relaxation to the patients.  LRT read a script to the patients for them to engage in the activity.  Pt were encouraged to follow along as the LRT read the script.  Education:  Stress Management, Discharge Planning.    Clinical Observations/Feedback: Pt did not attend group.   Karisha Marlin, LRT/CTRS  

## 2015-08-21 NOTE — Progress Notes (Signed)
Data. Patient denies SI/HI/AVH. Gave verbal agreement to not harm self and to come to staff if feelings/thoughts become overwhelming. Patient interacting well with staff and other patients. On her self inventory she reports, 2/10 for depression, 0/10 for hopelessness and 1/10 for anxiety. His goal for today is: "Being social". Action. Emotional support and encouragement offered. Education provided on medication, indications and side effect. Q 15 minute checks done for safety. Response. Safety on the unit maintained through 15 minute checks.  Medications taken as prescribed. Attended groups. Remained calm and appropriate through out shift.

## 2015-08-21 NOTE — Plan of Care (Signed)
Problem: Medication: Goal: Compliance with prescribed medication regimen will improve Outcome: Progressing Pt has been compliant with medications tonight.    

## 2015-08-21 NOTE — Plan of Care (Signed)
Problem: Coping: Goal: Ability to cope will improve Outcome: Not Progressing Client will improve ability to cope by speaking to SW about support resources, I.e. Group therapy or IOP.

## 2015-08-21 NOTE — Progress Notes (Addendum)
[facility] MD Progress Note  08/21/2015 3:59 PM Denise Conrad  MRN:  [id number] Subjective:  Patient is reporting improvement, normalization of mood, states she feels more " stable" and at this time does not endorse symptoms of hypomania or mania- does not endorse racing thoughts, irritability, and states the increased impulsivity and hyper-sexuality she was experiencing prior to admission have resolved. She denies significant depression at this time and states " I am having a good day , I am getting better". As she improves she is focusing on being discharged soon, states " I miss seeing my son" Objective : I have discussed case with treatment team, and have met with patient . Patient is improved compared to admission , presents with improving range of affect, improved mood . As above, denies current manic symptoms and none are apparent at present - does not present irritable, expansive, no flight of ideations or pressured speech, no psychomotor agitation at this time . Denies medication  Side effects. Visible on unit, going to groups. Interacting appropriately with peers No disruptive or agitated behaviors on unit  Labs reviewed - TSH WNL, lipid panel- mild hypercholesterolemia, hypertriglyceridemia  Principal Problem: Bipolar Disorder, most recent episode mixed  Diagnosis:   Patient Active Problem List   Diagnosis Date Noted  . Bipolar 1 disorder, depressed, severe ([place]) [F31.4] 08/19/2015  . Acute renal failure ([place]) [N17.9] 11/22/2014  . Bipolar 1 disorder ([place]) [F31.9] 11/20/2014  . Anxiety [F41.9] 11/20/2014  . Sepsis ([place]) [A41.9] 11/20/2014  . Acute suppurative tonsillitis [J03.90] 11/20/2014  . Pap smear of cervix with ASCUS, cannot exclude HGSIL [R87.611] 09/28/2014   Total Time spent with patient: 20 minutes    Past Medical History:  Past Medical History:  Diagnosis Date  . Anxiety   . Bipolar 1 disorder ([place])   . Depression     Past Surgical History:  Procedure  Laterality Date  . WISDOM TOOTH EXTRACTION     Family History:  Family History  Problem Relation Age of Onset  . Hypertension Father   . Heart failure Father     hyptertensive cardiovascular disease    Social History:  History  Alcohol Use No     History  Drug Use No    Social History   Social History  . Marital status: Single    Spouse name: N/A  . Number of children: N/A  . Years of education: N/A   Social History Main Topics  . Smoking status: Never Smoker  . Smokeless tobacco: Never Used  . Alcohol use No  . Drug use: No  . Sexual activity: Yes    Birth control/ protection: Condom, Pill   Other Topics Concern  . None   Social History Narrative  . None   Additional Social History:   Sleep: Good- slept 6 + hours last night   Appetite:  Good  Current Medications: Current Facility-Administered Medications  Medication Dose Route Frequency Provider Last Rate Last Dose  . acetaminophen (TYLENOL) tablet 650 mg  650 mg Oral Q6H PRN Laverle Hobby, PA-C      . alum & mag hydroxide-simeth (MAALOX/MYLANTA) 200-200-20 MG/5ML suspension 30 mL  30 mL Oral Q4H PRN Laverle Hobby, PA-C      . clonazePAM (KLONOPIN) tablet 1 mg  1 mg Oral BID PRN Laverle Hobby, PA-C      . DULoxetine (CYMBALTA) DR capsule 60 mg  60 mg Oral q morning - 10a Laverle Hobby, PA-C   60 mg at 08/21/15  7672  . fesoterodine (TOVIAZ) tablet 4 mg  4 mg Oral QHS Laverle Hobby, PA-C   4 mg at 08/20/15 2124  . ibuprofen (ADVIL,MOTRIN) tablet 600 mg  600 mg Oral Q6H PRN Laverle Hobby, PA-C   600 mg at 08/20/15 2159  . lamoTRIgine (LAMICTAL) tablet 150 mg  150 mg Oral BID Laverle Hobby, PA-C   150 mg at 08/21/15 0947  . lisdexamfetamine (VYVANSE) capsule 50 mg  50 mg Oral Daily Laverle Hobby, PA-C   50 mg at 08/21/15 0962  . loratadine (CLARITIN) tablet 10 mg  10 mg Oral Daily Laverle Hobby, PA-C   10 mg at 08/21/15 8366  . magnesium hydroxide (MILK OF MAGNESIA) suspension 30 mL  30 mL Oral  Daily PRN Laverle Hobby, PA-C      . OLANZapine (ZYPREXA) tablet 5 mg  5 mg Oral QHS Laverle Hobby, PA-C   5 mg at 08/20/15 2124    Lab Results:  Results for orders placed or performed during the hospital encounter of 08/19/15 (from the past 48 hour(s))  TSH     Status: None   Collection Time: 08/20/15  6:14 AM  Result Value Ref Range   TSH 3.976 0.350 - 4.500 uIU/mL    Comment: Performed at [facility]  Prolactin     Status: Abnormal   Collection Time: 08/20/15  6:14 AM  Result Value Ref Range   Prolactin 68.9 (H) 4.8 - 23.3 ng/mL    Comment: (NOTE) Performed At: [facility] [place], [place] [id number] Lindon Romp MD KP:[id number] Performed at [facility]   Lipid panel, fasting     Status: Abnormal   Collection Time: 08/20/15  6:14 AM  Result Value Ref Range   Cholesterol 208 (H) 0 - 200 mg/dL   Triglycerides 212 (H) <150 mg/dL   HDL 58 >40 mg/dL   Total CHOL/HDL Ratio 3.6 RATIO   VLDL 42 (H) 0 - 40 mg/dL   LDL Cholesterol 108 (H) 0 - 99 mg/dL    Comment:        Total Cholesterol/HDL:CHD Risk Coronary Heart Disease Risk Table                     Men   Women  1/2 Average Risk   3.4   3.3  Average Risk       5.0   4.4  2 X Average Risk   9.6   7.1  3 X Average Risk  23.4   11.0        Use the calculated Patient Ratio above and the CHD Risk Table to determine the patient's CHD Risk.        ATP III CLASSIFICATION (LDL):  <100     mg/dL   Optimal  100-129  mg/dL   Near or Above                    Optimal  130-159  mg/dL   Borderline  160-189  mg/dL   High  >190     mg/dL   Very High Performed at [facility]     Blood Alcohol level:  Lab Results  Component Value Date   [facility] <5 17/00/1749    Metabolic Disorder Labs: No results found for: HGBA1C, MPG Lab Results  Component Value Date   PROLACTIN 68.9 (H) 08/20/2015   Lab Results  Component Value Date  CHOL 208 (H) 08/20/2015    TRIG 212 (H) 08/20/2015   HDL 58 08/20/2015   CHOLHDL 3.6 08/20/2015   VLDL 42 (H) 08/20/2015   LDLCALC 108 (H) 08/20/2015    Physical Findings: AIMS: Facial and Oral Movements Muscles of Facial Expression: None, normal Lips and Perioral Area: None, normal Jaw: None, normal Tongue: None, normal,Extremity Movements Upper (arms, wrists, hands, fingers): None, normal Lower (legs, knees, ankles, toes): None, normal, Trunk Movements Neck, shoulders, hips: None, normal, Overall Severity Severity of abnormal movements (highest score from questions above): None, normal Incapacitation due to abnormal movements: None, normal Patient's awareness of abnormal movements (rate only patient's report): No Awareness, Dental Status Current problems with teeth and/or dentures?: No Does patient usually wear dentures?: No  CIWA:    COWS:     Musculoskeletal: Strength & Muscle Tone: within normal limits Gait & Station: normal Patient leans: N/A  Psychiatric Specialty Exam: Physical Exam  ROS no headache, no chest pain, no vomiting, no rash   Blood pressure 114/73, pulse (!) 108, temperature 98 F (36.7 C), temperature source Oral, resp. rate 18, height 5' 6"  (1.676 m), weight 298 lb (135.2 kg).Body mass index is 48.1 kg/m.  General Appearance: improved grooming   Eye Contact:  Good  Speech:  Normal Rate  Volume:  Normal- no pressured speech at this time  Mood:  improving, states she feels better , does not present manic at this time   Affect:  Appropriate and reactive   Thought Process:  Linear  Orientation:  Full (Time, Place, and Person)  Thought Content:  denies hallucinations, no delusions,not internally preoccupied   Suicidal Thoughts:  No denies any suicidal or self injurious ideations, denies any homicidal or violent ideations, contracts for safety on the unit   Homicidal Thoughts:  No  Memory:  recent and remote grossly intact  Judgement:  Improving   Insight:  improving   Psychomotor Activity:  Normal  Concentration:  Concentration: Good and Attention Span: Good  Recall:  Good  Fund of Knowledge:  Good  Language:  NA  Akathisia:  Negative  Handed:  Right  AIMS (if indicated):     Assets:  Desire for Improvement Resilience  ADL's:  Intact  Cognition:  WNL  Sleep:  Number of Hours: 6.5   Assessment - patient reports improvement, and presents with a stabilizing mood, without current symptoms of mania or of severe depression . Sleep is improving . Denies medication side effects at this time. Future oriented, and looking forward to discharging soon in order to reunite with loved ones/son. We have reviewed medication side effects, to include risk of sedation, weight gain, metabolic disturbances and potential abnormal movements from Zyprexa. Patient states she has tolerated medication well thus far, without any side effects Of note, patient's admission pregnancy test negative, but states she was sexually active just prior to admission and worries she might be pregnant, and " that it was just too early to show "   Treatment Plan Summary: Daily contact with patient to assess and evaluate symptoms and progress in treatment, Medication management, Plan inpatient treatment  and medications as below  Continue to encourage group and milieu participation to work on coping skills and symptom reduction Continue Cymbalta 60 mgrs QDAY for mood disorder, anxiety Continue Lamictal 150 mgrs BID for mood disorder, depression  Continue Zyprexa 5 mgrs QHS for mood disorder  Continue Vyvanse 50 mgrs QDAY for ADHD symptoms Continue Klonopin 1 mgr BID PRN  For anxiety As per  above, will order quantitative BHCG  Treatment team working on disposition planning  Neita Garnet, MD 08/21/2015, 3:59 PM

## 2015-08-21 NOTE — Plan of Care (Signed)
Problem: Education: Goal: Ability to make informed decisions regarding treatment will improve Outcome: Progressing Patient has completed her self inventory this shift.

## 2015-08-21 NOTE — Progress Notes (Signed)
D: Pt was in the dayroom upon initial approach.  Pt presents with appropriate affect and pleasant mood.  She reports her day was "good."  Pt reports her goal today was "to not stay in my room."  Pt has met her goal.  Pt denies SI/HI, denies hallucinations, reports chronic back pain of 8/10.  Pt attended evening group.  She has been visible in milieu interacting appropriately with peers and staff.    A: Introduced self to pt.  Actively listened to pt and offered support and encouragement.  Medication administered per order.  PRN medication administered for pain.    R: Pt is compliant with medications.  She verbally contracts for safety and reports she will inform staff of needs and concerns.  Will continue to monitor and assess.

## 2015-08-21 NOTE — [facility] Group Notes (Signed)
[facility] LCSW Group Therapy 08/21/2015  1:15 PM Type of Therapy: Group Therapy Participation Level: Active  Participation Quality: Attentive, Sharing and Supportive  Affect: Appropriate  Cognitive: Alert and Oriented  Insight: Developing/Improving and Engaged  Engagement in Therapy: Developing/Improving and Engaged  Modes of Intervention: Clarification, Confrontation, Discussion, Education, Exploration, Limit-setting, Orientation, Problem-solving, Rapport Building, Dance movement psychotherapist, Socialization and Support  Summary of Progress/Problems: The topic for group today was emotional regulation. This group focused on both positive and negative emotion identification and allowed group members to process ways to identify feelings, regulate negative emotions, and find healthy ways to manage internal/external emotions. Group members were asked to reflect on a time when their reaction to an emotion led to a negative outcome and explored how alternative responses using emotion regulation would have benefited them. Group members were also asked to discuss a time when emotion regulation was utilized when a negative emotion was experienced. Patient identified "hopelessness" as an emotion she has difficulty regulating. She shared that she feels misunderstood by her family due to her mental illness. CSW and other group members provided patient with emotional support and encouragement.   Samuella Bruin, MSW, LCSW Clinical Social Worker [facility] [phone]

## 2015-08-22 LAB — PROLACTIN: Prolactin: 35.1 ng/mL — ABNORMAL HIGH (ref 4.8–23.3)

## 2015-08-22 LAB — HEMOGLOBIN A1C
HEMOGLOBIN A1C: 5.6 % (ref 4.8–5.6)
Mean Plasma Glucose: 114 mg/dL

## 2015-08-22 MED ORDER — DULOXETINE HCL 60 MG PO CPEP: 60.0000 mg | ORAL_CAPSULE | Freq: Every morning | ORAL | 0 refills | Status: DC

## 2015-08-22 MED ORDER — FESOTERODINE FUMARATE ER 4 MG PO TB24: 4.0000 mg | ORAL_TABLET | Freq: Every day | ORAL | 0 refills | Status: DC

## 2015-08-22 MED ORDER — LAMOTRIGINE 150 MG PO TABS: 150.0000 mg | ORAL_TABLET | Freq: Two times a day (BID) | ORAL | 0 refills | Status: DC

## 2015-08-22 MED ORDER — OLANZAPINE 5 MG PO TABS: 5.0000 mg | ORAL_TABLET | Freq: Every day | ORAL | 0 refills | Status: DC

## 2015-08-22 MED ORDER — LISDEXAMFETAMINE DIMESYLATE 50 MG PO CAPS: 50.0000 mg | ORAL_CAPSULE | Freq: Every day | ORAL | 0 refills | Status: DC

## 2015-08-22 NOTE — [facility] Suicide Risk Assessment (Addendum)
[facility] Discharge Suicide Risk Assessment   Principal Problem: Bipolar Disorder, Depressed  Discharge Diagnoses:  Patient Active Problem List   Diagnosis Date Noted  . Bipolar 1 disorder, depressed, severe ([facility]) [F31.4] 08/19/2015  . Acute renal failure ([facility]) [N17.9] 11/22/2014  . Bipolar 1 disorder ([facility]) [F31.9] 11/20/2014  . Anxiety [F41.9] 11/20/2014  . Sepsis ([facility]) [A41.9] 11/20/2014  . Acute suppurative tonsillitis [J03.90] 11/20/2014  . Pap smear of cervix with ASCUS, cannot exclude HGSIL [R87.611] 09/28/2014    Total Time spent with patient: 30 minutes  Musculoskeletal: Strength & Muscle Tone: within normal limits Gait & Station: normal Patient leans: N/A  Psychiatric Specialty Exam: ROS no headache, no chest pain, no shortness of breath, no  Vomiting   Blood pressure 106/62, pulse (!) 114, temperature 98.2 F (36.8 C), temperature source Oral, resp. rate 20, height  (1.676 m), weight 298 lb (135.2 kg).Body mass index is 48.1 kg/m.  General Appearance: improved grooming   Eye Contact::  Good  Speech:  Normal Rate409  Volume:  Normal  Mood:  Euthymic- denies any lingering depression at this time   Affect:  Appropriate and Full Range  Thought Process:  Linear  Orientation:  Full (Time, Place, and Person)  Thought Content:  denies hallucinations, no delusions expressed   Suicidal Thoughts:  No- at this time denies any suicidal or self injurious ideations   Homicidal Thoughts:  No- denies any violent or homicidal ideations   Memory:  recent and remote grossly intact   Judgement:  Other:  improved  Insight:  Present  Psychomotor Activity:  Normal  Concentration:  Good  Recall:  Good  Fund of Knowledge:Good  Language: Good  Akathisia:  Negative  Handed:  Right  AIMS (if indicated):     Assets:  Desire for Improvement Resilience  Sleep:  Number of Hours: 6.5  Cognition: WNL  ADL's:  Intact   Mental Status Per Nursing Assessment::   On Admission:      Demographic Factors:  25 year old female, has 32 year old son  Loss Factors: Recently had stopped taking medications due to sore throat associated with elective tonsillectomy. Attributes worsening depression to medication non compliance   Historical Factors: Has been diagnosed with Bipolar Disorder - one suicide attempt by overdosing as a teenager  Risk Reduction Factors:   Responsible for children under 40 years of age, Sense of responsibility to family and Positive coping skills or problem solving skills  Continued Clinical Symptoms:  At this time patient improved compared to admission - presents with improved mood and at this time presents euthymic, with a full range, appropriate affect, no thought disorder, no suicidal ideations, no self injurious ideations, no psychotic symptoms, future oriented. Denies medication side effects at this time and feels medications are helping. She is looking forward to discharge and to reunite with her family/child (Patient denied amenorrhea, but had reported concern she might be pregnant - 7/24 pregnancy test negative) We have discussed medication side effects   Cognitive Features That Contribute To Risk:  No gross cognitive deficits noted upon discharge. Is alert , attentive, and oriented x 3   Suicide Risk:  Mild:  Suicidal ideation of limited frequency, intensity, duration, and specificity.  There are no identifiable plans, no associated intent, mild dysphoria and related symptoms, good self-control (both objective and subjective assessment), few other risk factors, and identifiable protective factors, including available and accessible social support.  Follow-up Information    [facility] Follow up on 09/09/2015.  Why:  Medication management appt with Tamela Oddi on Monday August 14th at 12:20pm. Call office if you need to reschedule.  Contact information: [address], [place], [place] [address]  Phone # [phone] Fax  # [phone]          Plan Of Care/Follow-up recommendations:  Activity:  as tolerated  Diet:  Regular  Tests:  Na Other:  See below  Patient is leaving unit in good spirits Plans to return home Plans to follow up as above  Nehemiah Massed, MD 08/22/2015, 12:32 PM

## 2015-08-22 NOTE — Progress Notes (Signed)
  [facility] Adult Case Management Discharge Plan :  Will you be returning to the same living situation after discharge:  No, patient plans to stay with family in [place] At discharge, do you have transportation home?: Yes,  family Do you have the ability to pay for your medications: Yes,  patient will be provided with prescriptions at discharge  Release of information consent forms completed and in the chart;  Patient's signature needed at discharge.  Patient to Follow up at: Follow-up Information    [facility] Follow up on 09/09/2015.   Why:  Medication management appt with Tamela Oddi on Monday August 14th at 12:20pm. Call office if you need to reschedule.  Contact information: [address], [place], [place] [address]  Phone # [phone] Fax # [phone]          Next level of care provider has access to [facility] Link:no  Safety Planning and Suicide Prevention discussed: Yes,  with patient and mother  Have you used any form of tobacco in the last 30 days? (Cigarettes, Smokeless Tobacco, Cigars, and/or Pipes): No  Has patient been referred to the Quitline?: Patient refused referral  Patient has been referred for addiction treatment: Yes  Jong Rickman, [place] 08/22/2015, 11:04 AM

## 2015-08-22 NOTE — [facility] Suicide Risk Assessment (Addendum)
Late entry 08/21/15 at 4pm  [facility] INPATIENT:  Family/Significant Other Suicide Prevention Education  Suicide Prevention Education:  Education Completed; mother Denise Conrad [phone],  (name of family member/significant other) has been identified by the patient as the family member/significant other with whom the patient will be residing, and identified as the person(s) who will aid the patient in the event of a mental health crisis (suicidal ideations/suicide attempt).  With written consent from the patient, the family member/significant other has been provided the following suicide prevention education, prior to the and/or following the discharge of the patient.  The suicide prevention education provided includes the following:  Suicide risk factors  Suicide prevention and interventions  National Suicide Hotline telephone number  [facility] assessment telephone number  [facility] Emergency Assistance 911  [facility] and/or Residential Mobile Crisis Unit telephone number  Request made of family/significant other to:  Remove weapons (e.g., guns, rifles, knives), all items previously/currently identified as safety concern.    Remove drugs/medications (over-the-counter, prescriptions, illicit drugs), all items previously/currently identified as a safety concern.  The family member/significant other verbalizes understanding of the suicide prevention education information provided.  The family member/significant other agrees to remove the items of safety concern listed above.  Denise Conrad, Denise Conrad 08/22/2015, 10:09 AM
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
## 2015-08-22 NOTE — Plan of Care (Signed)
Problem: Education: Goal: Emotional status will improve Outcome: Progressing Patient is using her coping skills and her anxiety and depression has decreased.

## 2015-08-22 NOTE — Progress Notes (Signed)
Adult Psychoeducational Group Note  Date:  08/22/2015 Time:  1000 Group Topic/Focus:  Self Esteem Action Plan:   The focus of this group is to help patients create a plan to continue to build self-esteem after discharge.   Participation Level:  Active  Participation Quality:  Appropriate and Attentive  Affect:  Appropriate  Cognitive:  Appropriate  Insight: Good  Engagement in Group:  Engaged  Modes of Intervention:  Discussion, Education and Rapport Building  Additional Comments:  Pt was engaged in group discussion and shared her insight  Gwenevere Ghazi Patience 08/22/2015, 11:33 AM

## 2015-08-22 NOTE — Progress Notes (Signed)
Discharge note:  Patient discharged home per MD order.  Patient is to follow up with [facility].  Reviewed AVS/transition record with patient and she indicated understanding.  Patient received prescriptions and samples of her medications.  Patient received all personal belongings from unit and locker.  She denies SI/HI/AVH.  She left ambulatory with her brother.

## 2015-08-22 NOTE — [facility] Group Notes (Signed)
[facility] Group Notes:  (Nursing/MHT/Case Management/Adjunct)  Date:  08/22/2015  Time:  9:22 AM  Type of Therapy:  Psychoeducational Skills  Participation Level:  Minimal  Participation Quality:  Appropriate and Attentive  Affect:  Appropriate  Cognitive:  Alert and Appropriate  Insight:  Appropriate  Engagement in Group:  Resistant  Modes of Intervention:  Support  Summary of Progress/Problems:  Denise Conrad 08/22/2015, 9:22 AM

## 2015-08-22 NOTE — Progress Notes (Signed)
Patient ID: Denise Conrad, female   DOB: 1990/05/18, 25 y.o.   MRN: [id number] D: Patient was unable to get her labs drawn this morning due to dizziness.  Labs were for her pregnancy test.  She states that it is early to tell and that is why she wanted to be tested.  Patient had some unprotected sex.  She complained of some back pain. She reports her depression and anxiety as a 1; hopelessness as 0.  Her goal today is to be "social."  She is sleeping well and her appetite is good.  She feels she is improving with her treatment.  She denies any thoughts of self harm today; she denies HI/AVH. A: Continue to monitor medication management and MD orders.  Safety checks completed every 15 minutes per protocol.  Offer support and encouragement as needed.  R: Patient is receptive to staff.  Patient is a possible discharge today,

## 2016-01-15 ENCOUNTER — Ambulatory Visit: Payer: Medicaid Other | Admitting: Obstetrics & Gynecology

## 2016-02-10 ENCOUNTER — Encounter (HOSPITAL_COMMUNITY): Payer: Self-pay | Admitting: Emergency Medicine

## 2016-02-10 ENCOUNTER — Emergency Department (HOSPITAL_COMMUNITY): Payer: Medicaid Other

## 2016-02-10 ENCOUNTER — Emergency Department (HOSPITAL_COMMUNITY)
Admission: EM | Admit: 2016-02-10 | Discharge: 2016-02-10 | Disposition: A | Discharge status: Home / Self Care | Payer: Medicaid Other | Attending: Emergency Medicine | Admitting: Emergency Medicine

## 2016-02-10 DIAGNOSIS — Y999 Unspecified external cause status: Secondary | ICD-10-CM | POA: Diagnosis not present

## 2016-02-10 DIAGNOSIS — Y9389 Activity, other specified: Secondary | ICD-10-CM | POA: Diagnosis not present

## 2016-02-10 DIAGNOSIS — X500XXA Overexertion from strenuous movement or load, initial encounter: Secondary | ICD-10-CM | POA: Diagnosis not present

## 2016-02-10 DIAGNOSIS — Y92009 Unspecified place in unspecified non-institutional (private) residence as the place of occurrence of the external cause: Secondary | ICD-10-CM | POA: Insufficient documentation

## 2016-02-10 DIAGNOSIS — Z91040 Latex allergy status: Secondary | ICD-10-CM | POA: Insufficient documentation

## 2016-02-10 DIAGNOSIS — S29012A Strain of muscle and tendon of back wall of thorax, initial encounter: Secondary | ICD-10-CM | POA: Insufficient documentation

## 2016-02-10 DIAGNOSIS — S3992XA Unspecified injury of lower back, initial encounter: Secondary | ICD-10-CM | POA: Diagnosis present

## 2016-02-10 DIAGNOSIS — S29019A Strain of muscle and tendon of unspecified wall of thorax, initial encounter: Secondary | ICD-10-CM

## 2016-02-10 MED ORDER — ONDANSETRON 4 MG PO TBDP: 4.0000 mg | ORAL_TABLET | Freq: Three times a day (TID) | ORAL | 0 refills | Status: DC | PRN

## 2016-02-10 MED ORDER — METHOCARBAMOL 500 MG PO TABS
1000.0000 mg | ORAL_TABLET | Freq: Once | ORAL | Status: AC
  Administered 2016-02-10: 1000 mg via ORAL
  Filled 2016-02-10: qty 2

## 2016-02-10 MED ORDER — ONDANSETRON 4 MG PO TBDP
4.0000 mg | ORAL_TABLET | Freq: Once | ORAL | Status: AC | PRN
  Administered 2016-02-10: 4 mg via ORAL
  Filled 2016-02-10: qty 1

## 2016-02-10 MED ORDER — KETOROLAC TROMETHAMINE 60 MG/2ML IM SOLN
60.0000 mg | Freq: Once | INTRAMUSCULAR | Status: AC
  Administered 2016-02-10: 60 mg via INTRAMUSCULAR
  Filled 2016-02-10: qty 2

## 2016-02-10 MED ORDER — NAPROXEN 500 MG PO TABS: 500.0000 mg | ORAL_TABLET | Freq: Two times a day (BID) | ORAL | 0 refills | Status: DC

## 2016-02-10 MED ORDER — LIDOCAINE 5 % EX PTCH: 1.0000 | MEDICATED_PATCH | CUTANEOUS | 0 refills | Status: DC

## 2016-02-10 MED ORDER — METHOCARBAMOL 500 MG PO TABS: 500.0000 mg | ORAL_TABLET | Freq: Two times a day (BID) | ORAL | 0 refills | Status: DC

## 2016-02-10 MED ORDER — HYDROCODONE-ACETAMINOPHEN 5-325 MG PO TABS: 1.0000 | ORAL_TABLET | Freq: Four times a day (QID) | ORAL | 0 refills | Status: DC | PRN

## 2016-02-10 MED ORDER — OXYCODONE-ACETAMINOPHEN 5-325 MG PO TABS
1.0000 | ORAL_TABLET | Freq: Once | ORAL | Status: AC
  Administered 2016-02-10: 1 via ORAL
  Filled 2016-02-10: qty 1

## 2016-02-10 NOTE — Discharge Instructions (Signed)
There were no acute abnormalities on the x-rays today. The pain, physical exam findings, and x-ray results point to the likelihood of a strain of this region of the back. Take it easy, but do not lay around too much as this may make any stiffness worse. Take 500 mg of naproxen every 12 hours or 800 mg of ibuprofen every 8 hours for the next 3 days. Take these medications with food to avoid upset stomach. Robaxin is a muscle relaxer and may help loosen stiff muscles. Vicodin for severe pain. Do not take the Robaxin Vicodin while driving or performing other dangerous activities. Be sure to perform the attached exercises starting with three times a week and working up to performing them daily. This is an essential part of preventing long term problems. Follow up with a primary care provider for any future management of these complaints.

## 2016-02-10 NOTE — Discharge Planning (Signed)
Discharge order placed. EDCM reviewed chart for possible CM needs.  No needs identified or communicated.  

## 2016-02-10 NOTE — ED Triage Notes (Signed)
Pt from home with c/o back pain starting just over a week ago after cleaning her carpets and moving furniture.  Pt reports when the pain is at it's worst she vomits.  Denies any urinary symptoms.  Hx of back pain and emesis with pain.  NAD, A&O.

## 2016-02-10 NOTE — ED Provider Notes (Signed)
[facility]-EMERGENCY DEPT Provider Note   CSN: [id number] Arrival date & time: 02/10/16  4098  By signing my name below, I, Denise Conrad, attest that this documentation has been prepared under the direction and in the presence of Shawn Joy, PA-C. Electronically Signed: Sonum Conrad, Neurosurgeon. 02/10/16. 9:48 AM.  History   Chief Complaint Chief Complaint  Patient presents with  . Back Pain   The history is provided by the patient. No language interpreter was used.     HPI Comments: Denise Conrad is a 26 y.o. female who presents to the Emergency Department complaining of constant, waxing and waning mid back pain that began 1 week ago after moving furniture. She reports a history of scoliosis and states she has back pain at baseline but this is much worse. She describes her symptoms as spasms and is mildly relieved by lying supine on a heating pad. She has not taken any medications for her symptoms as she would like to know the cause of her pain before trying any medications. She has had a few episodes of vomiting which she attributes to her pain; states this has happened before. She denies urinary symptoms, changes in bowel or bladder function, fever/chills, neuro deficits, or any other complaints.    Past Medical History:  Diagnosis Date  . Anxiety   . Bipolar 1 disorder (HCC)   . Depression     Patient Active Problem List   Diagnosis Date Noted  . Bipolar 1 disorder, depressed, severe (HCC) 08/19/2015  . Acute renal failure (HCC) 11/22/2014  . Bipolar 1 disorder (HCC) 11/20/2014  . Anxiety 11/20/2014  . Sepsis (HCC) 11/20/2014  . Acute suppurative tonsillitis 11/20/2014  . Pap smear of cervix with ASCUS, cannot exclude HGSIL 09/28/2014    Past Surgical History:  Procedure Laterality Date  . TONSILLECTOMY    . WISDOM TOOTH EXTRACTION      OB History    Gravida Para Term Preterm AB Living   1       1 0   SAB TAB Ectopic Multiple Live Births   1               Home  Medications    Prior to Admission medications   Medication Sig Start Date End Date Taking? Authorizing Provider  DULoxetine (CYMBALTA) 60 MG capsule Take 1 capsule (60 mg total) by mouth every morning. 08/22/15   Adonis Brook, NP  fesoterodine (TOVIAZ) 4 MG TB24 tablet Take 1 tablet (4 mg total) by mouth at bedtime. 08/22/15   Adonis Brook, NP  HYDROcodone-acetaminophen (NORCO/VICODIN) 5-325 MG tablet Take 1 tablet by mouth every 6 (six) hours as needed. 02/10/16   Shawn C Joy, PA-C  lamoTRIgine (LAMICTAL) 150 MG tablet Take 1 tablet (150 mg total) by mouth 2 (two) times daily. 08/22/15   Adonis Brook, NP  lidocaine (LIDODERM) 5 % Place 1 patch onto the skin daily. Remove & Discard patch within 12 hours or as directed by MD 02/10/16   Hillard Danker Joy, PA-C  lisdexamfetamine (VYVANSE) 50 MG capsule Take 1 capsule (50 mg total) by mouth daily. 08/22/15   Adonis Brook, NP  methocarbamol (ROBAXIN) 500 MG tablet Take 1 tablet (500 mg total) by mouth 2 (two) times daily. 02/10/16   Shawn C Joy, PA-C  naproxen (NAPROSYN) 500 MG tablet Take 1 tablet (500 mg total) by mouth 2 (two) times daily. 02/10/16   Anselm Pancoast, PA-C  Norgestim-Eth Estrad Triphasic (ORTHO TRI-CYCLEN, 28, PO) Take 1 tablet by mouth  daily.     Historical Provider, MD  OLANZapine (ZYPREXA) 5 MG tablet Take 1 tablet (5 mg total) by mouth at bedtime. 08/22/15   Adonis Brook, NP  ondansetron (ZOFRAN ODT) 4 MG disintegrating tablet Take 1 tablet (4 mg total) by mouth every 8 (eight) hours as needed for nausea or vomiting. 02/10/16   Anselm Pancoast, PA-C    Family History Family History  Problem Relation Age of Onset  . Hypertension Father   . Heart failure Father     hyptertensive cardiovascular disease    Social History Social History  Substance Use Topics  . Smoking status: Never Smoker  . Smokeless tobacco: Never Used  . Alcohol use No     Allergies   Latex and Sulfur   Review of Systems Review of Systems  Constitutional:  Negative for fever.  Genitourinary: Negative.  Negative for dysuria, flank pain, frequency, vaginal bleeding and vaginal discharge.  Musculoskeletal: Positive for back pain.  Neurological: Negative for weakness and numbness.     Physical Exam Updated Vital Signs BP 133/88 (BP Location: Left Arm)   Pulse (!) 136   Temp 98.6 F (37 C) (Oral)   Resp 24   Ht 5\' 6"  (1.676 m)   Wt 300 lb (136.1 kg)   SpO2 100%   BMI 48.42 kg/m   Physical Exam  Constitutional: She appears well-developed and well-nourished. No distress.  Obese female  HENT:  Head: Normocephalic and atraumatic.  Eyes: Conjunctivae are normal.  Neck: Normal range of motion. Neck supple.  Cardiovascular: Normal rate, regular rhythm, normal heart sounds and intact distal pulses.   Pulmonary/Chest: Effort normal and breath sounds normal. No respiratory distress.  Abdominal: Soft. There is no tenderness. There is no guarding.  Musculoskeletal: Normal range of motion. She exhibits tenderness. She exhibits no edema or deformity.  Tenderness to musculature flanking the thoracic spine bilaterally. Normal motor function intact in all extremities and spine. No midline spinal tenderness.    Lymphadenopathy:    She has no cervical adenopathy.  Neurological: She is alert.  No sensory deficits. Strength 5/5 in both lower extremities. No gait disturbance. Coordination intact including heel to shin.  Skin: Skin is warm and dry. Capillary refill takes less than 2 seconds. She is not diaphoretic.  Psychiatric: She has a normal mood and affect. Her behavior is normal.  Nursing note and vitals reviewed.    ED Treatments / Results  DIAGNOSTIC STUDIES: Oxygen Saturation is 100% on RA, normal by my interpretation.    COORDINATION OF CARE: 9:49 AM Discussed treatment plan with pt at bedside and pt agreed to plan.   Labs (all labs ordered are listed, but only abnormal results are displayed) Labs Reviewed - No data to display  EKG   EKG Interpretation None       Radiology Dg Thoracic Spine 2 View  Result Date: 02/10/2016 CLINICAL DATA:  Back injury moving heavy object. Lower thoracic back pain and tenderness. Initial encounter. EXAM: THORACIC SPINE 2 VIEWS COMPARISON:  None. FINDINGS: There is no evidence of thoracic spine fracture. Alignment is normal. Intervertebral disc spaces are maintained. No other significant bone abnormalities are identified. IMPRESSION: Negative. Electronically Signed   By: Myles Rosenthal M.D.   On: 02/10/2016 10:25   Dg Lumbar Spine Complete  Result Date: 02/10/2016 CLINICAL DATA:  Back injury wall moving heavy object. Lumbar back pain and tenderness. Initial encounter. EXAM: LUMBAR SPINE - COMPLETE 4+ VIEW COMPARISON:  CT on 06/21/2012 FINDINGS: There is no  evidence of lumbar spine fracture. Alignment is normal. Intervertebral disc spaces are maintained. Limbus vertebra again noted at L4. No evidence of facet DJD or other significant bone abnormality. IMPRESSION: No acute findings.  L4 limbus vertebra incidentally noted. Electronically Signed   By: Myles RosenthalJohn  Stahl M.D.   On: 02/10/2016 10:24    Procedures Procedures (including critical care time)  Medications Ordered in ED Medications  ondansetron (ZOFRAN-ODT) disintegrating tablet 4 mg (4 mg Oral Given 02/10/16 0827)  ketorolac (TORADOL) injection 60 mg (60 mg Intramuscular Given 02/10/16 1034)  oxyCODONE-acetaminophen (PERCOCET/ROXICET) 5-325 MG per tablet 1 tablet (1 tablet Oral Given 02/10/16 1035)  methocarbamol (ROBAXIN) tablet 1,000 mg (1,000 mg Oral Given 02/10/16 1035)     Initial Impression / Assessment and Plan / ED Course  I have reviewed the triage vital signs and the nursing notes.  Pertinent labs & imaging results that were available during my care of the patient were reviewed by me and considered in my medical decision making (see chart for details).  Clinical Course     Patient presents with onset of back pain that can be  traced to a lifting and straining event. Patient was somewhat anxious about her pain and stated she would feel more comfortable if x-rays were performed. No acute abnormalities on x-ray. Patient's presentation is consistent with muscular strain. No neuro or functional deficits. PCP follow-up. Home care and return precautions discussed. Patient voiced understanding of all instructions and is comfortable with discharge.  UA and urine pregnancy tests were ordered, but patient declined these stating that she knows her pain is due to the lifting event.  Vitals:   02/10/16 0810 02/10/16 1102  BP: 133/88 116/83  Pulse: (!) 136 114  Resp: 24 16  Temp: 98.6 F (37 C)   TempSrc: Oral   SpO2: 100% 95%  Weight: 136.1 kg   Height: 5\' 6"  (1.676 m)    Patient was not initially tachycardic upon my exam, however, I suspect anxiety is a factor in her tachycardia.  Final Clinical Impressions(s) / ED Diagnoses   Final diagnoses:  Strain of thoracic region, initial encounter    New Prescriptions Discharge Medication List as of 02/10/2016 10:54 AM    START taking these medications   Details  HYDROcodone-acetaminophen (NORCO/VICODIN) 5-325 MG tablet Take 1 tablet by mouth every 6 (six) hours as needed., Starting Mon 02/10/2016, Print    lidocaine (LIDODERM) 5 % Place 1 patch onto the skin daily. Remove & Discard patch within 12 hours or as directed by MD, Starting Mon 02/10/2016, Print    methocarbamol (ROBAXIN) 500 MG tablet Take 1 tablet (500 mg total) by mouth 2 (two) times daily., Starting Mon 02/10/2016, Print    naproxen (NAPROSYN) 500 MG tablet Take 1 tablet (500 mg total) by mouth 2 (two) times daily., Starting Mon 02/10/2016, Print    ondansetron (ZOFRAN ODT) 4 MG disintegrating tablet Take 1 tablet (4 mg total) by mouth every 8 (eight) hours as needed for nausea or vomiting., Starting Mon 02/10/2016, Print       I personally performed the services described in this documentation, which was  scribed in my presence. The recorded information has been reviewed and is accurate.   Anselm PancoastShawn C Joy, PA-C 02/11/16 0859    Anselm PancoastShawn C Joy, PA-C 02/11/16 0900    Jerelyn ScottMartha Linker, MD 02/11/16 [id number]

## 2016-02-10 NOTE — ED Notes (Signed)
Papers reviewed with patient and her significant other. She verbalizes understanding and reports decreased pain

## 2016-02-14 ENCOUNTER — Ambulatory Visit: Payer: Medicaid Other | Admitting: Obstetrics & Gynecology

## 2016-02-21 DIAGNOSIS — Z6841 Body Mass Index (BMI) 40.0 and over, adult: Secondary | ICD-10-CM

## 2016-02-21 DIAGNOSIS — M419 Scoliosis, unspecified: Secondary | ICD-10-CM | POA: Insufficient documentation

## 2016-02-21 DIAGNOSIS — M545 Low back pain, unspecified: Secondary | ICD-10-CM | POA: Insufficient documentation

## 2016-02-21 DIAGNOSIS — G8929 Other chronic pain: Secondary | ICD-10-CM | POA: Insufficient documentation

## 2016-02-21 DIAGNOSIS — Z8349 Family history of other endocrine, nutritional and metabolic diseases: Secondary | ICD-10-CM | POA: Insufficient documentation

## 2016-03-31 DIAGNOSIS — M797 Fibromyalgia: Secondary | ICD-10-CM | POA: Insufficient documentation

## 2016-04-01 ENCOUNTER — Encounter (HOSPITAL_COMMUNITY): Payer: Self-pay | Admitting: Emergency Medicine

## 2016-04-01 DIAGNOSIS — Y9289 Other specified places as the place of occurrence of the external cause: Secondary | ICD-10-CM | POA: Diagnosis not present

## 2016-04-01 DIAGNOSIS — W182XXA Fall in (into) shower or empty bathtub, initial encounter: Secondary | ICD-10-CM | POA: Diagnosis not present

## 2016-04-01 DIAGNOSIS — S139XXA Sprain of joints and ligaments of unspecified parts of neck, initial encounter: Secondary | ICD-10-CM | POA: Diagnosis not present

## 2016-04-01 DIAGNOSIS — Y999 Unspecified external cause status: Secondary | ICD-10-CM | POA: Insufficient documentation

## 2016-04-01 DIAGNOSIS — S199XXA Unspecified injury of neck, initial encounter: Secondary | ICD-10-CM | POA: Diagnosis present

## 2016-04-01 DIAGNOSIS — Y939 Activity, unspecified: Secondary | ICD-10-CM | POA: Insufficient documentation

## 2016-04-01 DIAGNOSIS — Z91040 Latex allergy status: Secondary | ICD-10-CM | POA: Diagnosis not present

## 2016-04-01 NOTE — ED Triage Notes (Signed)
Pt st's she fell in the shower yesterday hitting her neck.  Pt c/o severe pain in her neck.  Pt st's she has tinging in bil hands.  Pt st's her lower back feels numb and she has bil feet numbness and tingling.

## 2016-04-02 ENCOUNTER — Emergency Department (HOSPITAL_COMMUNITY): Payer: Medicaid Other

## 2016-04-02 ENCOUNTER — Emergency Department (HOSPITAL_COMMUNITY)
Admission: EM | Admit: 2016-04-02 | Discharge: 2016-04-02 | Disposition: A | Discharge status: Home / Self Care | Payer: Medicaid Other | Attending: Emergency Medicine | Admitting: Emergency Medicine

## 2016-04-02 DIAGNOSIS — S139XXA Sprain of joints and ligaments of unspecified parts of neck, initial encounter: Secondary | ICD-10-CM

## 2016-04-02 MED ORDER — KETOROLAC TROMETHAMINE 60 MG/2ML IM SOLN
30.0000 mg | Freq: Once | INTRAMUSCULAR | Status: AC
  Administered 2016-04-02: 30 mg via INTRAMUSCULAR
  Filled 2016-04-02: qty 2

## 2016-04-02 MED ORDER — NAPROXEN 500 MG PO TABS: 500.0000 mg | ORAL_TABLET | Freq: Two times a day (BID) | ORAL | 0 refills | Status: DC

## 2016-04-02 MED ORDER — METHOCARBAMOL 500 MG PO TABS: 500.0000 mg | ORAL_TABLET | Freq: Two times a day (BID) | ORAL | 0 refills | Status: DC

## 2016-04-02 NOTE — ED Provider Notes (Signed)
[facility] Provider Note   CSN: [id number] Arrival date & time: 04/01/16  2139     History   Chief Complaint Chief Complaint  Patient presents with  . Neck Pain    HPI Denise Conrad is a 26 y.o. female.  HPI Denise Conrad is a 25 y.o. female with history of anxiety, bipolar disorder, presents to emergency department complaining of neck injury. Patient states she slipped and fell in a shower and hit her neck on the side of the bathtub. States injury occurred 2 days ago. She states initially he has mild pain for which she brought a soft collar. She states she tried to "pop" her neck today and developed more pain. She denies any pain radiating down extremities but states she has "tingling" in both hands and feet. Patient states she took some ibuprofen which did not help. No other treatment prior to arrival to emergency department. She denies any headache. No loss of consciousness. No nausea or vomiting. No dizziness. No memory loss. Not anticoagulated  Past Medical History:  Diagnosis Date  . Anxiety   . Bipolar 1 disorder (HCC)   . Depression     Patient Active Problem List   Diagnosis Date Noted  . Bipolar 1 disorder, depressed, severe (HCC) 08/19/2015  . Acute renal failure (HCC) 11/22/2014  . Bipolar 1 disorder (HCC) 11/20/2014  . Anxiety 11/20/2014  . Sepsis (HCC) 11/20/2014  . Acute suppurative tonsillitis 11/20/2014  . Pap smear of cervix with ASCUS, cannot exclude HGSIL 09/28/2014    Past Surgical History:  Procedure Laterality Date  . TONSILLECTOMY    . WISDOM TOOTH EXTRACTION      OB History    Gravida Para Term Preterm AB Living   1       1 0   SAB TAB Ectopic Multiple Live Births   1               Home Medications    Prior to Admission medications   Medication Sig Start Date End Date Taking? Authorizing Provider  DULoxetine (CYMBALTA) 60 MG capsule Take 1 capsule (60 mg total) by mouth every morning. 08/22/15   Adonis Brook, NP    fesoterodine (TOVIAZ) 4 MG TB24 tablet Take 1 tablet (4 mg total) by mouth at bedtime. 08/22/15   Adonis Brook, NP  HYDROcodone-acetaminophen (NORCO/VICODIN) 5-325 MG tablet Take 1 tablet by mouth every 6 (six) hours as needed. 02/10/16   Shawn C Joy, PA-C  lamoTRIgine (LAMICTAL) 150 MG tablet Take 1 tablet (150 mg total) by mouth 2 (two) times daily. 08/22/15   Adonis Brook, NP  lidocaine (LIDODERM) 5 % Place 1 patch onto the skin daily. Remove & Discard patch within 12 hours or as directed by MD 02/10/16   Hillard Danker Joy, PA-C  lisdexamfetamine (VYVANSE) 50 MG capsule Take 1 capsule (50 mg total) by mouth daily. 08/22/15   Adonis Brook, NP  methocarbamol (ROBAXIN) 500 MG tablet Take 1 tablet (500 mg total) by mouth 2 (two) times daily. 02/10/16   Shawn C Joy, PA-C  methocarbamol (ROBAXIN) 500 MG tablet Take 1 tablet (500 mg total) by mouth 2 (two) times daily. 04/02/16   Kevonte Vanecek, PA-C  naproxen (NAPROSYN) 500 MG tablet Take 1 tablet (500 mg total) by mouth 2 (two) times daily. 02/10/16   Shawn C Joy, PA-C  naproxen (NAPROSYN) 500 MG tablet Take 1 tablet (500 mg total) by mouth 2 (two) times daily. 04/02/16   Jaynie Crumble, PA-C  Norgestim-Eth  Estrad Triphasic (ORTHO TRI-CYCLEN, 28, PO) Take 1 tablet by mouth daily.     Historical Provider, MD  OLANZapine (ZYPREXA) 5 MG tablet Take 1 tablet (5 mg total) by mouth at bedtime. 08/22/15   Adonis Brook, NP  ondansetron (ZOFRAN ODT) 4 MG disintegrating tablet Take 1 tablet (4 mg total) by mouth every 8 (eight) hours as needed for nausea or vomiting. 02/10/16   Anselm Pancoast, PA-C    Family History Family History  Problem Relation Age of Onset  . Hypertension Father   . Heart failure Father     hyptertensive cardiovascular disease    Social History Social History  Substance Use Topics  . Smoking status: Never Smoker  . Smokeless tobacco: Never Used  . Alcohol use No     Allergies   Latex and Sulfur   Review of Systems Review of  Systems  Constitutional: Negative for chills and fever.  Respiratory: Negative for cough, chest tightness and shortness of breath.   Cardiovascular: Negative for chest pain, palpitations and leg swelling.  Gastrointestinal: Negative for abdominal pain, diarrhea, nausea and vomiting.  Musculoskeletal: Positive for arthralgias, neck pain and neck stiffness. Negative for myalgias.  Skin: Negative for rash.  Neurological: Negative for dizziness, weakness, numbness and headaches.  All other systems reviewed and are negative.    Physical Exam Updated Vital Signs BP 111/71 (BP Location: Right Wrist)   Pulse 112   Temp 98.6 F (37 C) (Oral)   Resp 18   Ht 5\' 6"  (1.676 m)   Wt 136.1 kg   SpO2 96%   BMI 48.42 kg/m   Physical Exam  Constitutional: She is oriented to person, place, and time. She appears well-developed and well-nourished. No distress.  HENT:  Head: Normocephalic.  Eyes: Conjunctivae are normal.  Neck: Neck supple.  Midline tenderness over cervical spine, bilateral paravertebral tenderness.  Cardiovascular: Normal rate, regular rhythm and normal heart sounds.   Pulmonary/Chest: Effort normal and breath sounds normal. No respiratory distress. She has no wheezes. She has no rales.  Musculoskeletal: She exhibits no edema.  5/5 and equal bilateral upper and lower extremity strength. Grip strength 5/5 and equal bilaterally. Sensation intact in bilateral upper and lower extremities in all dermatomes.   Neurological: She is alert and oriented to person, place, and time.  Skin: Skin is warm and dry.  Psychiatric: She has a normal mood and affect. Her behavior is normal.  Nursing note and vitals reviewed.    ED Treatments / Results  Labs (all labs ordered are listed, but only abnormal results are displayed) Labs Reviewed - No data to display  EKG  EKG Interpretation None       Radiology Ct Cervical Spine Wo Contrast  Result Date: 04/02/2016 CLINICAL DATA:  Posterior  neck pain after fall in the shower. EXAM: CT CERVICAL SPINE WITHOUT CONTRAST TECHNIQUE: Multidetector CT imaging of the cervical spine was performed without intravenous contrast. Multiplanar CT image reconstructions were also generated. COMPARISON:  None. FINDINGS: Alignment: Reversal cervical lordosis possibly from spasm from positioning. Skull base and vertebrae: No acute fracture. No primary bone lesion or focal pathologic process. Soft tissues and spinal canal: No prevertebral fluid or swelling. No visible canal hematoma. Disc levels: No focal disc herniation or significant canal stenosis. Facet joints are maintained without subluxation or dislocation. No significant neural foraminal encroachment. Upper chest: Negative. Other: None. IMPRESSION: Slight reversal cervical lordosis which may simply reflect positioning of the patient versus muscle spasm. No acute osseous abnormality. No  posttraumatic fracture nor subluxation. Electronically Signed   By: Tollie Ethavid  Kwon M.D.   On: 04/02/2016 03:46    Procedures Procedures (including critical care time)  Medications Ordered in ED Medications  ketorolac (TORADOL) injection 30 mg (30 mg Intramuscular Given 04/02/16 0233)     Initial Impression / Assessment and Plan / ED Course  I have reviewed the triage vital signs and the nursing notes.  Pertinent labs & imaging results that were available during my care of the patient were reviewed by me and considered in my medical decision making (see chart for details).     Patient in emergency department after neck injury 2 days ago. Patient is wearing a soft collar. CT of her cervical spine was obtained due to some tingling description in hands and feet. CT scan is negative. Patient has full range of motion of the neck on reexamination with pain. Strength intact against resistance in all directions of the head. She is neurovascularly intact with intact sensation and strength in all extremities. Most likely cervical  strain. We'll treat with naproxen, Robaxin, follow-up with family doctor. Return precautions discussed.  Vitals:   04/01/16 2249 04/02/16 0300 04/02/16 0317 04/02/16 0411  BP:  108/73 105/65 111/71  Pulse:  114 108 112  Resp:   18 18  Temp:   98.6 F (37 C)   TempSrc:   Oral   SpO2:  99% 99% 96%  Weight: 136.1 kg     Height: 5\' 6"  (1.676 m)        Final Clinical Impressions(s) / ED Diagnoses   Final diagnoses:  Neck sprain, initial encounter    New Prescriptions Discharge Medication List as of 04/02/2016  4:15 AM    START taking these medications   Details  !! methocarbamol (ROBAXIN) 500 MG tablet Take 1 tablet (500 mg total) by mouth 2 (two) times daily., Starting Thu 04/02/2016, Print    !! naproxen (NAPROSYN) 500 MG tablet Take 1 tablet (500 mg total) by mouth 2 (two) times daily., Starting Thu 04/02/2016, Print     !! - Potential duplicate medications found. Please discuss with provider.       Jaynie Crumbleatyana Jailynn Lavalais, PA-C 04/02/16 0615    Layla MawKristen N Ward, DO 04/02/16 [phone]

## 2016-04-02 NOTE — Discharge Instructions (Signed)
Naprosyn for pain and inflammation. Robaxin for spasms. Gently stretch your neck several times a day. Try ice/heat. Follow up with family doctor.

## 2016-04-02 NOTE — ED Notes (Signed)
Pt very unhappy  She wants a neck xray because she thinks her neck is broken.  Unhappy with the explanation that she needs to be seen by the edp first  She fell yesterday ambulating without difficulty

## 2016-04-10 ENCOUNTER — Ambulatory Visit: Payer: Self-pay | Admitting: Obstetrics & Gynecology

## 2016-04-20 DIAGNOSIS — E611 Iron deficiency: Secondary | ICD-10-CM | POA: Insufficient documentation

## 2016-04-28 DIAGNOSIS — G4733 Obstructive sleep apnea (adult) (pediatric): Secondary | ICD-10-CM | POA: Insufficient documentation

## 2016-06-01 ENCOUNTER — Ambulatory Visit: Payer: Self-pay | Admitting: Obstetrics & Gynecology

## 2016-06-17 ENCOUNTER — Ambulatory Visit: Payer: Self-pay | Admitting: Obstetrics & Gynecology

## 2016-06-24 ENCOUNTER — Encounter (HOSPITAL_COMMUNITY): Payer: Self-pay | Admitting: Family Medicine

## 2016-06-24 ENCOUNTER — Ambulatory Visit (HOSPITAL_COMMUNITY)
Admission: EM | Admit: 2016-06-24 | Discharge: 2016-06-24 | Disposition: A | Discharge status: Home / Self Care | Payer: Medicaid Other | Attending: Family Medicine | Admitting: Family Medicine

## 2016-06-24 DIAGNOSIS — R197 Diarrhea, unspecified: Secondary | ICD-10-CM | POA: Diagnosis not present

## 2016-06-24 DIAGNOSIS — L509 Urticaria, unspecified: Secondary | ICD-10-CM | POA: Diagnosis not present

## 2016-06-24 MED ORDER — METHYLPREDNISOLONE ACETATE 80 MG/ML IJ SUSP
80.0000 mg | Freq: Once | INTRAMUSCULAR | Status: AC
  Administered 2016-06-24: 80 mg via INTRAMUSCULAR

## 2016-06-24 MED ORDER — METHYLPREDNISOLONE ACETATE 80 MG/ML IJ SUSP
INTRAMUSCULAR | Status: AC
  Filled 2016-06-24: qty 1

## 2016-06-24 NOTE — ED Provider Notes (Signed)
[facility]    CSN: [id number] Arrival date & time: 06/24/16  1758     History   Chief Complaint Chief Complaint  Patient presents with  . Allergic Reaction    HPI Denise Conrad is a 26 y.o. female.   Patient reports welps to entire body since last night after taking peptobismol and zofran for food poisoning. Patient states she took benadryl last night and it did help. Patient last dose of pepto at 4am. No respiratory symptoms      Past Medical History:  Diagnosis Date  . Anxiety   . Bipolar 1 disorder (HCC)   . Depression     Patient Active Problem List   Diagnosis Date Noted  . Bipolar 1 disorder, depressed, severe (HCC) 08/19/2015  . Acute renal failure (HCC) 11/22/2014  . Bipolar 1 disorder (HCC) 11/20/2014  . Anxiety 11/20/2014  . Sepsis (HCC) 11/20/2014  . Acute suppurative tonsillitis 11/20/2014  . Pap smear of cervix with ASCUS, cannot exclude HGSIL 09/28/2014    Past Surgical History:  Procedure Laterality Date  . TONSILLECTOMY    . WISDOM TOOTH EXTRACTION      OB History    Gravida Para Term Preterm AB Living   1       1 0   SAB TAB Ectopic Multiple Live Births   1               Home Medications    Prior to Admission medications   Medication Sig Start Date End Date Taking? Authorizing Provider  DULoxetine (CYMBALTA) 60 MG capsule Take 1 capsule (60 mg total) by mouth every morning. 08/22/15  Yes Adonis BrookAgustin, Sheila, NP  fesoterodine (TOVIAZ) 4 MG TB24 tablet Take 1 tablet (4 mg total) by mouth at bedtime. 08/22/15  Yes Adonis BrookAgustin, Sheila, NP  lamoTRIgine (LAMICTAL) 150 MG tablet Take 1 tablet (150 mg total) by mouth 2 (two) times daily. 08/22/15  Yes Adonis BrookAgustin, Sheila, NP  lisdexamfetamine (VYVANSE) 50 MG capsule Take 1 capsule (50 mg total) by mouth daily. 08/22/15  Yes Adonis BrookAgustin, Sheila, NP  OLANZapine (ZYPREXA) 5 MG tablet Take 1 tablet (5 mg total) by mouth at bedtime. 08/22/15  Yes Adonis BrookAgustin, Sheila, NP  ondansetron (ZOFRAN ODT) 4 MG  disintegrating tablet Take 1 tablet (4 mg total) by mouth every 8 (eight) hours as needed for nausea or vomiting. 02/10/16  Yes Joy, Shawn C, PA-C  lidocaine (LIDODERM) 5 % Place 1 patch onto the skin daily. Remove & Discard patch within 12 hours or as directed by MD 02/10/16   Harolyn RutherfordJoy, Shawn C, PA-C  Norgestim-Eth Estrad Triphasic (ORTHO TRI-CYCLEN, 28, PO) Take 1 tablet by mouth daily.     [provider]    Family History Family History  Problem Relation Age of Onset  . Hypertension Father   . Heart failure Father        hyptertensive cardiovascular disease    Social History Social History  Substance Use Topics  . Smoking status: Never Smoker  . Smokeless tobacco: Never Used  . Alcohol use No     Allergies   Latex and Sulfur   Review of Systems Review of Systems  Gastrointestinal: Positive for diarrhea.  Skin: Positive for rash.  All other systems reviewed and are negative.    Physical Exam Triage Vital Signs ED Triage Vitals [06/24/16 1831]  Enc Vitals Group     BP      Pulse Rate (!) 117     Resp 16  Temp 98.9 F (37.2 C)     Temp Source Oral     SpO2 97 %     Weight      Height      Head Circumference      Peak Flow      Pain Score      Pain Loc      Pain Edu?      Excl. in GC?    No data found.   Updated Vital Signs Pulse (!) 117   Temp 98.9 F (37.2 C) (Oral)   Resp 16   SpO2 97%    Physical Exam  Constitutional: She is oriented to person, place, and time. She appears well-developed and well-nourished.  HENT:  Right Ear: External ear normal.  Left Ear: External ear normal.  Black tongue  Eyes: Conjunctivae are normal. Pupils are equal, round, and reactive to light.  Neck: Normal range of motion. Neck supple.  Cardiovascular: Normal rate.   Pulmonary/Chest: Effort normal.  Abdominal: Soft. Bowel sounds are normal. There is no tenderness.  Musculoskeletal: Normal range of motion.  Neurological: She is alert and oriented to  person, place, and time.  Skin: Skin is warm and dry.  Nursing note and vitals reviewed.    UC Treatments / Results  Labs (all labs ordered are listed, but only abnormal results are displayed) Labs Reviewed - No data to display  EKG  EKG Interpretation None       Radiology No results found.  Procedures Procedures (including critical care time)  Medications Ordered in UC Medications  methylPREDNISolone acetate (DEPO-MEDROL) injection 80 mg (not administered)     Initial Impression / Assessment and Plan / UC Course  I have reviewed the triage vital signs and the nursing notes.  Pertinent labs & imaging results that were available during my care of the patient were reviewed by me and considered in my medical decision making (see chart for details).     Final Clinical Impressions(s) / UC Diagnoses   Final diagnoses:  Urticaria  Diarrhea, unspecified type    New Prescriptions Current Discharge Medication List       Elvina Sidle, MD 06/24/16 [phone]

## 2016-06-24 NOTE — ED Triage Notes (Signed)
Patient reports welps to entire body since last night after taking pepto and zofran for food poisoning. Patient states she took benadryl last night and it did help. Patient last dose of pepto at 4am. Airway intact.

## 2016-06-25 ENCOUNTER — Emergency Department (HOSPITAL_COMMUNITY)
Admission: EM | Admit: 2016-06-25 | Discharge: 2016-06-25 | Disposition: A | Discharge status: Home / Self Care | Payer: Medicaid Other | Attending: Emergency Medicine | Admitting: Emergency Medicine

## 2016-06-25 ENCOUNTER — Encounter (HOSPITAL_COMMUNITY): Payer: Self-pay

## 2016-06-25 DIAGNOSIS — Z91040 Latex allergy status: Secondary | ICD-10-CM | POA: Insufficient documentation

## 2016-06-25 DIAGNOSIS — L509 Urticaria, unspecified: Secondary | ICD-10-CM

## 2016-06-25 DIAGNOSIS — Z79899 Other long term (current) drug therapy: Secondary | ICD-10-CM | POA: Insufficient documentation

## 2016-06-25 DIAGNOSIS — R21 Rash and other nonspecific skin eruption: Secondary | ICD-10-CM | POA: Diagnosis present

## 2016-06-25 DIAGNOSIS — R112 Nausea with vomiting, unspecified: Secondary | ICD-10-CM | POA: Diagnosis not present

## 2016-06-25 DIAGNOSIS — T7840XA Allergy, unspecified, initial encounter: Secondary | ICD-10-CM

## 2016-06-25 DIAGNOSIS — L500 Allergic urticaria: Secondary | ICD-10-CM | POA: Diagnosis not present

## 2016-06-25 DIAGNOSIS — R197 Diarrhea, unspecified: Secondary | ICD-10-CM | POA: Diagnosis not present

## 2016-06-25 LAB — URINALYSIS, MICROSCOPIC (REFLEX)

## 2016-06-25 LAB — COMPREHENSIVE METABOLIC PANEL
ALK PHOS: 67 U/L (ref 38–126)
ALT: 44 U/L (ref 14–54)
AST: 50 U/L — AB (ref 15–41)
Albumin: 3.6 g/dL (ref 3.5–5.0)
Anion gap: 12 (ref 5–15)
BILIRUBIN TOTAL: 1.6 mg/dL — AB (ref 0.3–1.2)
BUN: 8 mg/dL (ref 6–20)
CALCIUM: 8.9 mg/dL (ref 8.9–10.3)
CHLORIDE: 104 mmol/L (ref 101–111)
CO2: 17 mmol/L — ABNORMAL LOW (ref 22–32)
Creatinine, Ser: 1.03 mg/dL — ABNORMAL HIGH (ref 0.44–1.00)
GFR calc Af Amer: >60 mL/min (ref >60)
Glucose, Bld: 142 mg/dL — ABNORMAL HIGH (ref 65–99)
Potassium: 5.5 mmol/L — ABNORMAL HIGH (ref 3.5–5.1)
Sodium: 133 mmol/L — ABNORMAL LOW (ref 135–145)
TOTAL PROTEIN: 7 g/dL (ref 6.5–8.1)

## 2016-06-25 LAB — CBC WITH DIFFERENTIAL/PLATELET
Basophils Absolute: 0 10*3/uL (ref 0.0–0.1)
Basophils Relative: 0 %
EOS PCT: 1 %
Eosinophils Absolute: 0.1 10*3/uL (ref 0.0–0.7)
HCT: 46 % (ref 36.0–46.0)
Hemoglobin: 15.6 g/dL — ABNORMAL HIGH (ref 12.0–15.0)
LYMPHS ABS: 5.1 10*3/uL — AB (ref 0.7–4.0)
Lymphocytes Relative: 25 %
MCH: 28.1 pg (ref 26.0–34.0)
MCHC: 33.9 g/dL (ref 30.0–36.0)
MCV: 82.9 fL (ref 78.0–100.0)
Monocytes Absolute: 1.1 10*3/uL — ABNORMAL HIGH (ref 0.1–1.0)
Monocytes Relative: 5 %
Neutro Abs: 14.3 10*3/uL — ABNORMAL HIGH (ref 1.7–7.7)
Neutrophils Relative %: 69 %
PLATELETS: 516 10*3/uL — AB (ref 150–400)
RBC: 5.55 MIL/uL — AB (ref 3.87–5.11)
RDW: 16.1 % — ABNORMAL HIGH (ref 11.5–15.5)
WBC: 20.6 10*3/uL — AB (ref 4.0–10.5)

## 2016-06-25 LAB — C DIFFICILE QUICK SCREEN W PCR REFLEX
C DIFFICILE (CDIFF) INTERP: NOT DETECTED
C DIFFICLE (CDIFF) ANTIGEN: NEGATIVE
C Diff toxin: NEGATIVE

## 2016-06-25 LAB — POC OCCULT BLOOD, ED: Fecal Occult Bld: NEGATIVE

## 2016-06-25 LAB — URINALYSIS, ROUTINE W REFLEX MICROSCOPIC
Bilirubin Urine: NEGATIVE
GLUCOSE, UA: NEGATIVE mg/dL
Hgb urine dipstick: NEGATIVE
Ketones, ur: NEGATIVE mg/dL
Nitrite: NEGATIVE
PROTEIN: 30 mg/dL — AB
pH: 6 (ref 5.0–8.0)

## 2016-06-25 LAB — LIPASE, BLOOD: LIPASE: 14 U/L (ref 11–51)

## 2016-06-25 LAB — POC URINE PREG, ED: Preg Test, Ur: NEGATIVE

## 2016-06-25 MED ORDER — DIPHENHYDRAMINE HCL 50 MG/ML IJ SOLN
25.0000 mg | Freq: Once | INTRAMUSCULAR | Status: AC
  Administered 2016-06-25: 25 mg via INTRAVENOUS
  Filled 2016-06-25: qty 1

## 2016-06-25 MED ORDER — SODIUM CHLORIDE 0.9 % IV BOLUS (SEPSIS): 1000.0000 mL | Freq: Once | INTRAVENOUS | Status: DC

## 2016-06-25 MED ORDER — FAMOTIDINE IN NACL 20-0.9 MG/50ML-% IV SOLN
20.0000 mg | Freq: Once | INTRAVENOUS | Status: AC
  Administered 2016-06-25: 20 mg via INTRAVENOUS
  Filled 2016-06-25: qty 50

## 2016-06-25 MED ORDER — PROMETHAZINE HCL 25 MG PO TABS: 25.0000 mg | ORAL_TABLET | Freq: Four times a day (QID) | ORAL | 0 refills | Status: DC | PRN

## 2016-06-25 MED ORDER — METHYLPREDNISOLONE SODIUM SUCC 125 MG IJ SOLR
125.0000 mg | Freq: Once | INTRAMUSCULAR | Status: AC
  Administered 2016-06-25: 125 mg via INTRAVENOUS
  Filled 2016-06-25: qty 2

## 2016-06-25 MED ORDER — ONDANSETRON HCL 4 MG/2ML IJ SOLN
4.0000 mg | Freq: Once | INTRAMUSCULAR | Status: DC
  Filled 2016-06-25: qty 2

## 2016-06-25 MED ORDER — PREDNISONE 10 MG PO TABS: 30.0000 mg | ORAL_TABLET | Freq: Every day | ORAL | 0 refills | Status: AC

## 2016-06-25 MED ORDER — SODIUM CHLORIDE 0.9 % IV BOLUS (SEPSIS)
1000.0000 mL | Freq: Once | INTRAVENOUS | Status: AC
  Administered 2016-06-25: 1000 mL via INTRAVENOUS

## 2016-06-25 NOTE — ED Provider Notes (Signed)
[facility]-EMER[facility]ENCY DEPT Provider Note   CSN: [id number] Arrival date & time: 06/25/16  9563     History   Chief Complaint Chief Complaint  Patient presents with  . Allergic Reaction    HPI Denise Conrad is a 26 y.o. female.   Allergic Reaction  Presenting symptoms: rash      Presents with rash, resolving vomiting and diarrhea. Vomiting was 2-3 times, diarrhea 12x per day. Reports diarrhea is now improving. Took zofran she had left over from prior. Had [employer] Saturday night, Thursday her brother had made hamburgers/eggs and eggs had been recalled for Salmonella.  Sunday began to have diarrhea and emesis.  Reports diarrhea was black after taking pepto bismol. Tuesday, had hives, was itching, the next day had diffuse urticaria and went to urgent care. Was given steroid with improvement.  Was taking benadryl which did not help.  Has had an allergic reaction to sulfa before.  Reports some dyspnea since Tuesday, mild, no current dyspnea.  Did not have any rash or shortness of breath initially with n/v/diarrhea.  Was on doxycyline 2-3 weeks ago. Has hx of tachycardia, reports baseline HR 110-130s.    Past Medical History:  Diagnosis Date  . Anxiety   . Bipolar 1 disorder (HCC)   . Depression     Patient Active Problem List   Diagnosis Date Noted  . Bipolar 1 disorder, depressed, severe (HCC) 08/19/2015  . Acute renal failure (HCC) 11/22/2014  . Bipolar 1 disorder (HCC) 11/20/2014  . Anxiety 11/20/2014  . Sepsis (HCC) 11/20/2014  . Acute suppurative tonsillitis 11/20/2014  . Pap smear of cervix with ASCUS, cannot exclude HGSIL 09/28/2014    Past Surgical History:  Procedure Laterality Date  . TONSILLECTOMY    . WISDOM TOOTH EXTRACTION      OB History    Gravida Para Term Preterm AB Living   1       1 0   SAB TAB Ectopic Multiple Live Births   1               Home Medications    Prior to Admission medications   Medication Sig Start Date End Date Taking?  Authorizing Provider  amitriptyline (ELAVIL) 25 MG tablet Take 25 mg by mouth at bedtime. 04/13/16  Yes [provider]  caffeine 200 MG TABS tablet Take 200 mg by mouth daily.   Yes [provider]  cetirizine (ZYRTEC) 10 MG tablet Take 10 mg by mouth at bedtime. 06/06/16  Yes [provider]  clonazePAM (KLONOPIN) 0.5 MG tablet Take 1 mg by mouth at bedtime as needed for anxiety.    Yes [provider]  diclofenac (CATAFLAM) 50 MG tablet Take 100 mg by mouth at bedtime. 01/24/16  Yes [provider]  DULoxetine (CYMBALTA) 60 MG capsule Take 1 capsule (60 mg total) by mouth every morning. 08/22/15  Yes Adonis Brook, NP  etonogestrel (NEXPLANON) 68 MG IMPL implant Inject 68 mg into the skin once. Left arm   Yes [provider]  ferrous sulfate 324 (65 Fe) MG TBEC Take 65 mg by mouth daily.   Yes [provider]  fesoterodine (TOVIAZ) 4 MG TB24 tablet Take 1 tablet (4 mg total) by mouth at bedtime. Patient taking differently: Take 8 mg by mouth at bedtime.  08/22/15  Yes Adonis Brook, NP  lamoTRIgine (LAMICTAL) 150 MG tablet Take 1 tablet (150 mg total) by mouth 2 (two) times daily. 08/22/15  Yes Adonis Brook, NP  lisdexamfetamine Arlyce Harman)  50 MG capsule Take 1 capsule (50 mg total) by mouth daily. 08/22/15  Yes Adonis Brook, NP  omeprazole (PRILOSEC) 20 MG capsule Take 20 mg by mouth at bedtime. 04/07/16 04/07/17 Yes [provider]  ondansetron (ZOFRAN ODT) 4 MG disintegrating tablet Take 1 tablet (4 mg total) by mouth every 8 (eight) hours as needed for nausea or vomiting. 02/10/16  Yes Joy, Shawn C, PA-C  predniSONE (DELTASONE) 10 MG tablet Take 3 tablets (30 mg total) by mouth daily. 06/25/16 06/28/16  Alvira Monday, MD  promethazine (PHENERGAN) 25 MG tablet Take 1 tablet (25 mg total) by mouth every 6 (six) hours as needed for nausea or vomiting. 06/25/16   Alvira Monday, MD    Family History Family History    Problem Relation Age of Onset  . Hypertension Father   . Heart failure Father        hyptertensive cardiovascular disease    Social History Social History  Substance Use Topics  . Smoking status: Never Smoker  . Smokeless tobacco: Never Used  . Alcohol use No     Allergies   Pepto-bismol [bismuth]; Zofran [ondansetron]; Latex; and Sulfur   Review of Systems Review of Systems  Constitutional: Negative for fever.  HENT: Negative for sore throat.   Eyes: Negative for visual disturbance.  Respiratory: Positive for shortness of breath. Negative for cough.   Cardiovascular: Negative for chest pain.  Gastrointestinal: Positive for diarrhea, nausea and vomiting. Negative for abdominal pain. Blood in stool: black.  Genitourinary: Negative for difficulty urinating and dysuria.  Musculoskeletal: Negative for back pain and neck pain.  Skin: Positive for rash.  Neurological: Positive for light-headedness (reports anxiety regarding IVs). Negative for syncope and headaches.     Physical Exam Updated Vital Signs BP 129/90   Pulse (!) 112   Temp 97.8 F (36.6 C) (Oral)   Resp (!) 24   SpO2 98%   Physical Exam  Constitutional: She is oriented to person, place, and time. She appears well-developed and well-nourished. No distress.  HENT:  Head: Normocephalic and atraumatic.  Eyes: Conjunctivae and EOM are normal.  Neck: Normal range of motion.  Cardiovascular: Normal rate, regular rhythm, normal heart sounds and intact distal pulses.  Exam reveals no gallop and no friction rub.   No murmur heard. Pulmonary/Chest: Effort normal and breath sounds normal. No respiratory distress. She has no wheezes. She has no rales.  Abdominal: Soft. She exhibits no distension. There is no tenderness. There is no guarding.  Musculoskeletal: She exhibits no edema or tenderness.  Neurological: She is alert and oriented to person, place, and time.  Skin: Skin is warm and dry. Rash noted. Rash is  urticarial (diffuse over abdomen, back arms, legs). She is not diaphoretic. No erythema.  Nursing note and vitals reviewed.    ED Treatments / Results  Labs (all labs ordered are listed, but only abnormal results are displayed) Labs Reviewed  COMPREHENSIVE METABOLIC PANEL - Abnormal; Notable for the following:       Result Value   Sodium 133 (*)    Potassium 5.5 (*)    CO2 17 (*)    Glucose, Bld 142 (*)    Creatinine, Ser 1.03 (*)    AST 50 (*)    Total Bilirubin 1.6 (*)    All other components within normal limits  CBC WITH DIFFERENTIAL/PLATELET - Abnormal; Notable for the following:    WBC 20.6 (*)    RBC 5.55 (*)    Hemoglobin 15.6 (*)  RDW 16.1 (*)    Platelets 516 (*)    Neutro Abs 14.3 (*)    Lymphs Abs 5.1 (*)    Monocytes Absolute 1.1 (*)    All other components within normal limits  URINALYSIS, ROUTINE W REFLEX MICROSCOPIC - Abnormal; Notable for the following:    APPearance CLOUDY (*)    Specific Gravity, Urine >1.030 (*)    Protein, ur 30 (*)    Leukocytes, UA TRACE (*)    All other components within normal limits  URINALYSIS, MICROSCOPIC (REFLEX) - Abnormal; Notable for the following:    Bacteria, UA RARE (*)    Squamous Epithelial / LPF 0-5 (*)    All other components within normal limits  C DIFFICILE QUICK SCREEN W PCR REFLEX  LIPASE, BLOOD  OCCULT BLOOD X 1 CARD TO LAB, STOOL  POC URINE PREG, ED  POC OCCULT BLOOD, ED    EKG  EKG Interpretation  Date/Time:  Thursday Jun 25 2016 09:10:08 EDT Ventricular Rate:  128 PR Interval:    QRS Duration: 74 QT Interval:  291 QTC Calculation: 425 R Axis:   53 Text Interpretation:  Sinus tachycardia No significant change since last tracing Confirmed by [facility] MD, Denny Peon ([id number]) on 06/25/2016 9:47:49 AM       Radiology No results found.  Procedures Procedures (including critical care time)  Medications Ordered in ED Medications  ondansetron (ZOFRAN) injection 4 mg (4 mg Intravenous Not Given  06/25/16 0927)  sodium chloride 0.9 % bolus 1,000 mL (0 mLs Intravenous Stopped 06/25/16 1100)  diphenhydrAMINE (BENADRYL) injection 25 mg (25 mg Intravenous Given 06/25/16 0922)  methylPREDNISolone sodium succinate (SOLU-MEDROL) 125 mg/2 mL injection 125 mg (125 mg Intravenous Given 06/25/16 0922)  famotidine (PEPCID) IVPB 20 mg premix (0 mg Intravenous Stopped 06/25/16 0941)     Initial Impression / Assessment and Plan / ED Course  I have reviewed the triage vital signs and the nursing notes.  Pertinent labs & imaging results that were available during my care of the patient were reviewed by me and considered in my medical decision making (see chart for details).    26 year old female with a history of bipolar, anxiety, depression presents with concern for rash, and nausea, vomiting and diarrhea which is improving. Given nausea, vomiting and diarrhea preceded symptoms of rash by several days, she has no sign of wheezing on exam, have low suspicion for anaphylaxis as etiology of symptoms.   Regarding diarrhea, nausea and vomiting-patient with benign abdominal exam. C. difficile tested and negative. Given patient ate recalled eggs 2-3 days prior to development of symptoms, feel salmonella is a possible cause of her symptoms. She is improving spontaneously, and recommended continued supportive care. She is given IV fluids with improvement in heart rate back to what she reports is her baseline.  She is given a prescription for Phenergan for continued supportive care.  Labs show signs of dehydration with mildly decreased bicarbonate. BMP was hemolyzed, feel potassium is likely normal.  She was given Solu-Medrol, Benadryl and Zantac with improvement in rash and itching, however continuing symptoms. No other signs of anaphylaxis on the emergency department.  Feel the hives may be an allergic reaction to Zofran or Pepto-Bismol. Recommend patient avoid both of these medications. Recommend follow-up with  primary care physician as well as allergist. Given prescription for prednisone for 3 days, recommend scheduled Benadryl and H2 blocker. Patient discharged in stable condition with understanding of reasons to return.   Final Clinical Impressions(s) / ED Diagnoses  Final diagnoses:  Allergic reaction, initial encounter  Hives  Nausea vomiting and diarrhea, suspected salmonella    New Prescriptions New Prescriptions   PREDNISONE (DELTASONE) 10 MG TABLET    Take 3 tablets (30 mg total) by mouth daily.   PROMETHAZINE (PHENERGAN) 25 MG TABLET    Take 1 tablet (25 mg total) by mouth every 6 (six) hours as needed for nausea or vomiting.     Alvira MondaySchlossman, Shary Lamos, MD 06/25/16 1251

## 2016-06-25 NOTE — ED Notes (Signed)
IV attempted x2- unsuccessful. 

## 2016-06-25 NOTE — ED Notes (Signed)
ED Provider at bedside. 

## 2016-06-25 NOTE — ED Triage Notes (Signed)
Pt here for allergic reaction. She states possible rxn to pepto bismol. She reports receiving a cortisone shot yesterday but has gotten worse. Significant rash noted over trunk and legs. Pt reports shortness of breath. Airway intact. Lung sounds clear.

## 2016-07-31 ENCOUNTER — Ambulatory Visit (INDEPENDENT_AMBULATORY_CARE_PROVIDER_SITE_OTHER): Payer: Medicaid Other | Admitting: Obstetrics & Gynecology

## 2016-07-31 ENCOUNTER — Other Ambulatory Visit (HOSPITAL_COMMUNITY)
Admission: RE | Admit: 2016-07-31 | Discharge: 2016-07-31 | Disposition: A | Discharge status: Home / Self Care | Payer: Medicaid Other | Source: Ambulatory Visit | Attending: Obstetrics & Gynecology | Admitting: Obstetrics & Gynecology

## 2016-07-31 ENCOUNTER — Encounter: Payer: Self-pay | Admitting: Obstetrics & Gynecology

## 2016-07-31 VITALS — BP 120/82 | HR 109 | Ht 66.0 in | Wt 311.0 lb

## 2016-07-31 DIAGNOSIS — N879 Dysplasia of cervix uteri, unspecified: Secondary | ICD-10-CM | POA: Diagnosis present

## 2016-07-31 DIAGNOSIS — R87820 Cervical low risk human papillomavirus (HPV) DNA test positive: Secondary | ICD-10-CM | POA: Insufficient documentation

## 2016-07-31 DIAGNOSIS — Z0000 Encounter for general adult medical examination without abnormal findings: Secondary | ICD-10-CM

## 2016-07-31 DIAGNOSIS — Z01419 Encounter for gynecological examination (general) (routine) without abnormal findings: Secondary | ICD-10-CM

## 2016-07-31 DIAGNOSIS — A749 Chlamydial infection, unspecified: Secondary | ICD-10-CM | POA: Insufficient documentation

## 2016-07-31 NOTE — Patient Instructions (Signed)
Preventive Care 18-39 Years, Female Preventive care refers to lifestyle choices and visits with your health care provider that can promote health and wellness. What does preventive care include?  A yearly physical exam. This is also called an annual well check.  Dental exams once or twice a year.  Routine eye exams. Ask your health care provider how often you should have your eyes checked.  Personal lifestyle choices, including: ? Daily care of your teeth and gums. ? Regular physical activity. ? Eating a healthy diet. ? Avoiding tobacco and drug use. ? Limiting alcohol use. ? Practicing safe sex. ? Taking vitamin and mineral supplements as recommended by your health care provider. What happens during an annual well check? The services and screenings done by your health care provider during your annual well check will depend on your age, overall health, lifestyle risk factors, and family history of disease. Counseling Your health care provider may ask you questions about your:  Alcohol use.  Tobacco use.  Drug use.  Emotional well-being.  Home and relationship well-being.  Sexual activity.  Eating habits.  Work and work Statistician.  Method of birth control.  Menstrual cycle.  Pregnancy history.  Screening You may have the following tests or measurements:  Height, weight, and BMI.  Diabetes screening. This is done by checking your blood sugar (glucose) after you have not eaten for a while (fasting).  Blood pressure.  Lipid and cholesterol levels. These may be checked every 5 years starting at age 66.  Skin check.  Hepatitis C blood test.  Hepatitis B blood test.  Sexually transmitted disease (STD) testing.  BRCA-related cancer screening. This may be done if you have a family history of breast, ovarian, tubal, or peritoneal cancers.  Pelvic exam and Pap test. This may be done every 3 years starting at age 40. Starting at age 59, this may be done every 5  years if you have a Pap test in combination with an HPV test.  Discuss your test results, treatment options, and if necessary, the need for more tests with your health care provider. Vaccines Your health care provider may recommend certain vaccines, such as:  Influenza vaccine. This is recommended every year.  Tetanus, diphtheria, and acellular pertussis (Tdap, Td) vaccine. You may need a Td booster every 10 years.  Varicella vaccine. You may need this if you have not been vaccinated.  HPV vaccine. If you are 69 or younger, you may need three doses over 6 months.  Measles, mumps, and rubella (MMR) vaccine. You may need at least one dose of MMR. You may also need a second dose.  Pneumococcal 13-valent conjugate (PCV13) vaccine. You may need this if you have certain conditions and were not previously vaccinated.  Pneumococcal polysaccharide (PPSV23) vaccine. You may need one or two doses if you smoke cigarettes or if you have certain conditions.  Meningococcal vaccine. One dose is recommended if you are age 27-21 years and a first-year college student living in a residence hall, or if you have one of several medical conditions. You may also need additional booster doses.  Hepatitis A vaccine. You may need this if you have certain conditions or if you travel or work in places where you may be exposed to hepatitis A.  Hepatitis B vaccine. You may need this if you have certain conditions or if you travel or work in places where you may be exposed to hepatitis B.  Haemophilus influenzae type b (Hib) vaccine. You may need this if  you have certain risk factors.  Talk to your health care provider about which screenings and vaccines you need and how often you need them. This information is not intended to replace advice given to you by your health care provider. Make sure you discuss any questions you have with your health care provider. Document Released: 03/10/2001 Document Revised: 10/02/2015  Document Reviewed: 11/13/2014 Elsevier Interactive Patient Education  2017 [employer].

## 2016-07-31 NOTE — Progress Notes (Signed)
GYNECOLOGY ANNUAL PREVENTATIVE CARE ENCOUNTER NOTE  Subjective:   Denise Conrad is a 26 y.o. [phone] female here for a routine annual gynecologic exam.  Current complaints: none.  History of CIN2, s/p cryotherapy in 05/2015, was supposed to follow up in 6 months but said "things happened, I could not make it". Also was diagnosed and treated for chlamydia in 05/2016, wants testing today to make sure it is gone.  Denies abnormal vaginal bleeding, discharge, pelvic pain, problems with intercourse or other gynecologic concerns.    Gynecologic History No LMP recorded. Patient has had an implant. 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 Acute renal failure (HCC) 11/22/2014  . Acute suppurative tonsillitis 11/20/2014  . Anxiety   . Bipolar 1 disorder (HCC)   . Depression   . Fibromyalgia   . Sepsis (HCC) 11/20/2014    Past Surgical History:  Procedure Laterality Date  . TONSILLECTOMY    . WISDOM TOOTH EXTRACTION      Current Outpatient Prescriptions on File Prior to Visit  Medication Sig Dispense Refill  . amitriptyline (ELAVIL) 25 MG tablet Take 25 mg by mouth at bedtime.    . caffeine 200 MG TABS tablet Take 200 mg by mouth daily.    . cetirizine (ZYRTEC) 10 MG tablet Take 10 mg by mouth at bedtime.  12  . clonazePAM (KLONOPIN) 0.5 MG tablet Take 1 mg by mouth at bedtime as needed for anxiety.     . diclofenac (CATAFLAM) 50 MG tablet  Take 100 mg by mouth at bedtime.    . DULoxetine (CYMBALTA) 60 MG capsule Take 1 capsule (60 mg total) by mouth every morning. 30 capsule 0  . etonogestrel (NEXPLANON) 68 MG IMPL implant Inject 68 mg into the skin once. Left arm    . ferrous sulfate 324 (65 Fe) MG TBEC Take 65 mg by mouth daily.    . fesoterodine (TOVIAZ) 4 MG TB24 tablet Take 1 tablet (4 mg total) by mouth at bedtime. (Patient taking differently: Take 8 mg by mouth at bedtime. ) 30 tablet 0  . lamoTRIgine (LAMICTAL) 150 MG tablet Take 1 tablet (150 mg total) by mouth 2 (two) times daily. 60 tablet 0  . lisdexamfetamine (VYVANSE) 50 MG capsule Take 1 capsule (50 mg total) by mouth daily. 30 capsule 0  . omeprazole (PRILOSEC) 20 MG capsule Take 20 mg by mouth at bedtime.    . promethazine (PHENERGAN) 25 MG tablet Take 1 tablet (25 mg total) by mouth every 6 (six) hours as needed for nausea or vomiting. 30 tablet 0   No current facility-administered medications on file prior to visit.     Allergies  Allergen Reactions  . Pepto-Bismol [Bismuth] Hives  . Zofran [Ondansetron]  Hives  . Latex Rash  . Sulfur Rash    Social History   Social History  . Marital status: Single    Spouse name: N/A  . Number of children: N/A  . Years of education: N/A   Occupational History  . Not on file.   Social History Main Topics  . Smoking status: Never Smoker  . Smokeless tobacco: Never Used  . Alcohol use No  . Drug use: No  . Sexual activity: Yes    Partners: Male    Birth control/ protection: Implant   Other Topics Concern  . Not on file   Social History Narrative  . No narrative on file    Family History  Problem Relation Age of Onset  . Hashimoto's thyroiditis Mother   . Fibromyalgia Mother   . Hypertension Father   . Heart failure Father        hyptertensive cardiovascular disease    The following portions of the patient's history were reviewed and updated as appropriate: allergies, current medications, past  family history, past medical history, past social history, past surgical history and problem list.  Review of Systems Pertinent items noted in HPI and remainder of comprehensive ROS otherwise negative.   Objective:  BP 120/82   Pulse (!) 109   Ht 5\' 6"  (1.676 m)   Wt (!) 311 lb (141.1 kg)   BMI 50.20 kg/m  CONSTITUTIONAL: Well-developed, well-nourished female in no acute distress.  HENT:  Normocephalic, atraumatic, External right and left ear normal. Oropharynx is clear and moist EYES: Conjunctivae and EOM are normal. Pupils are equal, round, and reactive to light. No scleral icterus.  NECK: Normal range of motion, supple, no masses.  Normal thyroid.  SKIN: Skin is warm and dry. No rash noted. Not diaphoretic. No erythema. No pallor. NEUROLOGIC: Alert and oriented to person, place, and time. Normal reflexes, muscle tone coordination. No cranial nerve deficit noted. PSYCHIATRIC: Normal mood and affect. Normal behavior. Normal judgment and thought content. CARDIOVASCULAR: Normal heart rate noted, regular rhythm RESPIRATORY: Clear to auscultation bilaterally. Effort and breath sounds normal, no problems with respiration noted. BREASTS: Symmetric in size. No masses, skin changes, nipple drainage, or lymphadenopathy. ABDOMEN: Soft, obese, normal bowel sounds, no distention appreciated.  No tenderness, rebound or guarding.  PELVIC: Normal appearing external genitalia; normal appearing vaginal mucosa and cervix.  Normal appearing discharge.  Pap smear obtained.  Unable to palpate uterus or adnexa secondary to habitus.  MUSCULOSKELETAL: Normal range of motion. No tenderness.  No cyanosis, clubbing, or edema.  2+ distal pulses.   Assessment:  Annual gynecologic examination with pap smear History of CIN2 Recent Chlamydia infection   Plan:  Will follow up results of pap smear and ancillary testing and manage accordingly. Routine preventative health maintenance measures emphasized. Please  refer to After Visit Summary for other counseling recommendations.    Jaynie CollinsUGONNA  ANYANWU, MD, FACOG Attending Obstetrician & Gynecologist, [facility]

## 2016-08-04 ENCOUNTER — Encounter: Payer: Self-pay | Admitting: Obstetrics & Gynecology

## 2016-08-04 LAB — CYTOLOGY - PAP
Chlamydia: NEGATIVE
Diagnosis: NEGATIVE
HPV 16/18/45 GENOTYPING: NEGATIVE
HPV: DETECTED — AB
Neisseria Gonorrhea: NEGATIVE
Trichomonas: NEGATIVE

## 2016-08-05 ENCOUNTER — Telehealth: Payer: Self-pay | Admitting: *Deleted

## 2016-08-05 NOTE — Telephone Encounter (Signed)
-----   Message from Tereso NewcomerUgonna A Anyanwu, MD sent at 08/04/2016  4:44 PM EDT ----- Normal cytology on pap smear with positive HPV, but negative HPV 16 and 18/45 on 07/31/2016.  Will repeat cotesting in one year. Problem list updated. Please call to inform patient of results.

## 2016-08-05 NOTE — Telephone Encounter (Signed)
Informed pt of results and recommendation.  Pt acknowledged instructions.

## 2016-08-20 DIAGNOSIS — M47817 Spondylosis without myelopathy or radiculopathy, lumbosacral region: Secondary | ICD-10-CM | POA: Insufficient documentation

## 2017-03-05 DIAGNOSIS — J311 Chronic nasopharyngitis: Secondary | ICD-10-CM | POA: Insufficient documentation

## 2017-05-28 DIAGNOSIS — L700 Acne vulgaris: Secondary | ICD-10-CM | POA: Insufficient documentation

## 2017-05-30 DIAGNOSIS — R7301 Impaired fasting glucose: Secondary | ICD-10-CM | POA: Insufficient documentation

## 2017-08-17 ENCOUNTER — Encounter: Payer: Self-pay | Admitting: Family Medicine

## 2017-08-17 ENCOUNTER — Ambulatory Visit: Payer: Medicaid Other | Admitting: Family Medicine

## 2017-08-17 ENCOUNTER — Other Ambulatory Visit (HOSPITAL_COMMUNITY)
Admission: RE | Admit: 2017-08-17 | Discharge: 2017-08-17 | Disposition: A | Discharge status: Home / Self Care | Payer: Medicaid Other | Source: Ambulatory Visit | Attending: Family Medicine | Admitting: Family Medicine

## 2017-08-17 VITALS — BP 127/91 | HR 105 | Resp 16 | Ht 66.0 in | Wt 344.8 lb

## 2017-08-17 DIAGNOSIS — Z30011 Encounter for initial prescription of contraceptive pills: Secondary | ICD-10-CM | POA: Diagnosis not present

## 2017-08-17 DIAGNOSIS — R87820 Cervical low risk human papillomavirus (HPV) DNA test positive: Secondary | ICD-10-CM | POA: Diagnosis not present

## 2017-08-17 DIAGNOSIS — Z3046 Encounter for surveillance of implantable subdermal contraceptive: Secondary | ICD-10-CM | POA: Diagnosis not present

## 2017-08-17 MED ORDER — LEVONORGEST-ETH ESTRAD 91-DAY 0.15-0.03 MG PO TABS: 1.0000 | ORAL_TABLET | Freq: Every day | ORAL | 4 refills | Status: DC

## 2017-08-17 NOTE — Progress Notes (Signed)
   Subjective:    Patient ID: Denise Conrad is a 27 y.o. female presenting with Procedure (Nexplanon removal)  on 08/17/2017  HPI: Has Nexplanon x 2 years. Has gained 70 lbs and notes worsening moods and acne. Has no cycles. On a lot of meds for psych issues. She thinks the Nexplanon has made things worse. Wants to try OC's instead. Not currently sexually active. On Seasonale in the past, desires this again. Recent CPE by PCP. Last pap 07/2016 with HR HPV and needs co-testing now.  Review of Systems  Constitutional: Positive for unexpected weight change. Negative for chills and fever.  Respiratory: Negative for shortness of breath.   Cardiovascular: Negative for chest pain.  Gastrointestinal: Negative for abdominal pain, nausea and vomiting.  Genitourinary: Negative for dysuria and vaginal bleeding.  Skin: Positive for rash.      Objective:    BP (!) 127/91 (BP Location: Right Wrist, Patient Position: Sitting, Cuff Size: Large)   Pulse (!) 105   Resp 16   Ht 5\' 6"  (1.676 m)   Wt (!) 344 lb 12.8 oz (156.4 kg)   LMP  (Exact Date)   BMI 55.65 kg/m  Physical Exam  Constitutional: She is oriented to person, place, and time. She appears well-developed and well-nourished. No distress.  HENT:  Head: Normocephalic and atraumatic.  Eyes: No scleral icterus.  Neck: Neck supple.  Cardiovascular: Normal rate.  Pulmonary/Chest: Effort normal.  Abdominal: Soft.  Genitourinary:  Genitourinary Comments: BUS normal, vagina is pink and rugated, cervix is multiparous without lesion, exam limited by body habitus.   Neurological: She is alert and oriented to person, place, and time.  Skin: Skin is warm and dry.  Psychiatric: She has a normal mood and affect.   Procedure: Patient given informed consent for removal of her Nexplanon, time out was performed.  Signed copy in the chart.  Appropriate time out taken. Nexplanon site identified.  Area prepped in usual sterile fashon. One cc of 1%  lidocaine was used to anesthetize the area at the distal end of the implant. A small stab incision was made right beside the implant on the distal portion.  The Nexplanon rod was grasped using hemostats and removed without difficulty.  There was less than 3 cc blood loss. There were no complications.  A small amount of antibiotic ointment and steri-strips were applied over the small incision.  A pressure bandage was applied to reduce any bruising.  The patient tolerated the procedure well and was given post procedure instructions.      Assessment & Plan:   Problem List Items Addressed This Visit      Unprioritized   Pap smear of cervix shows low risk HPV present - Primary   Relevant Orders   Cytology - PAP    Other Visit Diagnoses    Encounter for initial prescription of contraceptive pills       trial of Seasonique, if not working may switch to monophasic pill and skip placebos. Interested in low hormone dose--could try Loestrin prn.   Relevant Medications   levonorgestrel-ethinyl estradiol (SEASONALE,INTROVALE,JOLESSA) 0.15-0.03 MG tablet   Encounter for Nexplanon removal       cannot guarantee this will make all her symptoms go away.      Total face-to-face time with patient: 15 minutes. Over 50% of encounter was spent on counseling and coordination of care. Return if symptoms worsen or fail to improve.  Reva Boresanya S Cristian Grieves 08/17/2017 8:58 AM

## 2017-08-18 ENCOUNTER — Ambulatory Visit: Payer: Medicaid Other | Attending: Audiology | Admitting: Audiology

## 2017-08-18 DIAGNOSIS — H833X3 Noise effects on inner ear, bilateral: Secondary | ICD-10-CM | POA: Insufficient documentation

## 2017-08-18 DIAGNOSIS — H9312 Tinnitus, left ear: Secondary | ICD-10-CM | POA: Insufficient documentation

## 2017-08-18 DIAGNOSIS — H93299 Other abnormal auditory perceptions, unspecified ear: Secondary | ICD-10-CM | POA: Diagnosis present

## 2017-08-18 NOTE — Procedures (Signed)
[facility] [address] [place], [place] [address] [phone]   AUDIOLOGICAL EVALUATION   Name: Denise Conrad DOB:  14-Nov-1990 MRN:  [id number]                   Diagnosis:Tinnitus, abnormal hearing screen Date: 08/18/2017                       Referent: Stephens Shire,  PA   HISTORY:      [name], age 27 y.o. years, was seen for a repeat audiological evaluation to rule out a progressive hearing loss.Marland Kitchen She was previously seen here on 05/01/2015,  06/13/2014 and on 02/19/2015  with "tinnitus" and "normal hearing thresholds bilaterally with normal inner ear function and acoustic reflexes bilaterally.  The inner ear function responses were present but borderline at some frequencies bilaterally but were absent in the right ear at 10kHz". She reports that the "tinnitus in her left ear is now constant 24/7".       Since the last visit here,  [name] saw an "ENT at [facility]" and she had her "tonsils and adenoids removed in 2017". She reports that "I haven't had strep since the tonsils have been removed". Important because she reports that in "2016 she had strep that went into septis with a 6 day hospitalization".    EVALUATION: Pure tone air and bone conduction range from 20-30 dBHL from 250Hz  - 4000Hz  and 15 dBHL at 8000Hz . The hearing loss appears sensorineural bilaterally using conventional audiometry with inserts.    Speech detection thresholds are consistent with hearing thresholds supporting good reliability at 20 dBHL on the right and 25 dBHL on the left used recorded SRT words. Word recognition is 100% at 60 dBHL using recorded NU-6 words lists in each ear. In minimal background noise with +5 dB signal to noise ratio, word recognition drops to 68% on the right and 56% on the left.   Otoscopic inspection reveals clear ear canals with visible tympanic membranes without redness.  Tympanometry shows normal middle ear volume, pressure  and compliance. Ipsilateral and contralateral acoustic reflexes range from 80-95dB from 500Hz  - 4000Hz . Acoustic reflex decay is negative bilaterally. Tinnitus is matched at >10kHz at 44 dBHL and appears to suppress.   Samatha reports some sound sensitivity -volume of 60-70 dBHL "bothers" or is "annoying".; 70-75 dBHL "hurts a little" and 85dBHL "hurts a lot". These results are consistent with sound sensitivity or recruitment bilaterally.    CONCLUSION:       Kajal Scalici has some audiological changes and evaluation by an ENT is recommended: a) unilateral tinnitus is "constant" "24/7" and "bothersome" B) word recognition is poorer bilaterally compared to 2017 and c) Samatha is reporting increase sound sensitivity with poorer balance.      [name] has normal to borderline normal hearing thresholds bilaterally, that appears to have a slight sensorineural component. Word recognition is excellent in quiet but drops to poor in each ear in minimal background noise.     Testing does not show any retrocochlear findings with negative acoustic reflex decay and normal ipsilateral and contralateral acoustic reflexes. The reported tinnitus appears to be >10kHz at approximately 44 dBHL.        RECOMMENDATIONS: 1.   Consider referral to an ENT: a) unilateral tinnitus is "constant" "24/7". B) word recognition is poorer bilaterally compared to 2017 and c) Samatha is reporting increase sound sensitivity with poorer balance.   2.   Consider  a balance assessment. Lelon MastSamantha states she is falling more and clumsy.  3.   Monitor hearing in 6-12 months - earlier if there are changes or concerns. This evaluation may be completed here or at the ENT office.    Deborah L. Kate SableWoodward, Au.D., CCC-A Doctor of Audiology

## 2017-08-19 LAB — CYTOLOGY - PAP
Diagnosis: NEGATIVE
HPV: NOT DETECTED

## 2017-10-01 ENCOUNTER — Encounter (HOSPITAL_COMMUNITY): Payer: Self-pay

## 2017-10-01 ENCOUNTER — Emergency Department (HOSPITAL_COMMUNITY): Payer: Medicaid Other

## 2017-10-01 ENCOUNTER — Emergency Department (HOSPITAL_COMMUNITY)
Admission: EM | Admit: 2017-10-01 | Discharge: 2017-10-01 | Disposition: A | Discharge status: Home / Self Care | Payer: Medicaid Other | Attending: Emergency Medicine | Admitting: Emergency Medicine

## 2017-10-01 ENCOUNTER — Other Ambulatory Visit: Payer: Self-pay

## 2017-10-01 DIAGNOSIS — R479 Unspecified speech disturbances: Secondary | ICD-10-CM | POA: Insufficient documentation

## 2017-10-01 DIAGNOSIS — Z91040 Latex allergy status: Secondary | ICD-10-CM | POA: Insufficient documentation

## 2017-10-01 DIAGNOSIS — Z79899 Other long term (current) drug therapy: Secondary | ICD-10-CM | POA: Insufficient documentation

## 2017-10-01 DIAGNOSIS — R51 Headache: Secondary | ICD-10-CM | POA: Insufficient documentation

## 2017-10-01 DIAGNOSIS — R002 Palpitations: Secondary | ICD-10-CM | POA: Insufficient documentation

## 2017-10-01 DIAGNOSIS — R519 Headache, unspecified: Secondary | ICD-10-CM

## 2017-10-01 LAB — DIFFERENTIAL
ABS IMMATURE GRANULOCYTES: 0 10*3/uL (ref 0.0–0.1)
BASOS ABS: 0 10*3/uL (ref 0.0–0.1)
BASOS PCT: 0 %
Eosinophils Absolute: 0.3 10*3/uL (ref 0.0–0.7)
Eosinophils Relative: 2 %
Immature Granulocytes: 0 %
Lymphocytes Relative: 31 %
Lymphs Abs: 3.5 10*3/uL (ref 0.7–4.0)
MONO ABS: 0.7 10*3/uL (ref 0.1–1.0)
MONOS PCT: 6 %
NEUTROS ABS: 6.6 10*3/uL (ref 1.7–7.7)
NEUTROS PCT: 59 %

## 2017-10-01 LAB — I-STAT BETA HCG BLOOD, ED (MC, WL, AP ONLY): I-stat hCG, quantitative: <5 m[IU]/mL (ref <5)

## 2017-10-01 LAB — CBC
HCT: 45.7 % (ref 36.0–46.0)
HEMOGLOBIN: 14.6 g/dL (ref 12.0–15.0)
MCH: 29 pg (ref 26.0–34.0)
MCHC: 31.9 g/dL (ref 30.0–36.0)
MCV: 90.7 fL (ref 78.0–100.0)
Platelets: 447 10*3/uL — ABNORMAL HIGH (ref 150–400)
RBC: 5.04 MIL/uL (ref 3.87–5.11)
RDW: 14.1 % (ref 11.5–15.5)
WBC: 11.1 10*3/uL — ABNORMAL HIGH (ref 4.0–10.5)

## 2017-10-01 LAB — I-STAT TROPONIN, ED: Troponin i, poc: 0 ng/mL (ref 0.00–0.08)

## 2017-10-01 LAB — COMPREHENSIVE METABOLIC PANEL
ALK PHOS: 82 U/L (ref 38–126)
ALT: 83 U/L — AB (ref 0–44)
AST: 66 U/L — ABNORMAL HIGH (ref 15–41)
Albumin: 3.6 g/dL (ref 3.5–5.0)
Anion gap: 11 (ref 5–15)
BUN: 7 mg/dL (ref 6–20)
CHLORIDE: 108 mmol/L (ref 98–111)
CO2: 20 mmol/L — AB (ref 22–32)
CREATININE: 0.88 mg/dL (ref 0.44–1.00)
Calcium: 9.3 mg/dL (ref 8.9–10.3)
GFR calc non Af Amer: >60 mL/min (ref >60)
Glucose, Bld: 126 mg/dL — ABNORMAL HIGH (ref 70–99)
Potassium: 3.5 mmol/L (ref 3.5–5.1)
SODIUM: 139 mmol/L (ref 135–145)
Total Bilirubin: 0.5 mg/dL (ref 0.3–1.2)
Total Protein: 7.3 g/dL (ref 6.5–8.1)

## 2017-10-01 LAB — I-STAT CHEM 8, ED
BUN: 8 mg/dL (ref 6–20)
CHLORIDE: 107 mmol/L (ref 98–111)
CREATININE: 0.9 mg/dL (ref 0.44–1.00)
Calcium, Ion: 1.19 mmol/L (ref 1.15–1.40)
GLUCOSE: 126 mg/dL — AB (ref 70–99)
HEMATOCRIT: 43 % (ref 36.0–46.0)
Hemoglobin: 14.6 g/dL (ref 12.0–15.0)
POTASSIUM: 3.5 mmol/L (ref 3.5–5.1)
Sodium: 141 mmol/L (ref 135–145)
TCO2: 20 mmol/L — ABNORMAL LOW (ref 22–32)

## 2017-10-01 LAB — CBG MONITORING, ED: Glucose-Capillary: 88 mg/dL (ref 70–99)

## 2017-10-01 LAB — PROTIME-INR
INR: 0.93
Prothrombin Time: 12.4 seconds (ref 11.4–15.2)

## 2017-10-01 LAB — APTT: aPTT: 29 seconds (ref 24–36)

## 2017-10-01 MED ORDER — DIPHENHYDRAMINE HCL 50 MG/ML IJ SOLN
25.0000 mg | Freq: Once | INTRAMUSCULAR | Status: AC
  Administered 2017-10-01: 25 mg via INTRAVENOUS
  Filled 2017-10-01: qty 1

## 2017-10-01 MED ORDER — ACETAZOLAMIDE 250 MG PO TABS: 500.0000 mg | ORAL_TABLET | Freq: Two times a day (BID) | ORAL | 0 refills | Status: DC

## 2017-10-01 MED ORDER — PROCHLORPERAZINE EDISYLATE 10 MG/2ML IJ SOLN
10.0000 mg | Freq: Once | INTRAMUSCULAR | Status: AC
  Administered 2017-10-01: 10 mg via INTRAVENOUS
  Filled 2017-10-01: qty 2

## 2017-10-01 NOTE — ED Triage Notes (Signed)
Pt presents with headache x 4 days; pt reports h/o migraines but reports this pain is different, reports the pain is behind L eye and radiates into L arm and hip.  Pt reports difficulty speaking with pain.

## 2017-10-01 NOTE — ED Provider Notes (Signed)
[facility] EMERGENCY DEPARTMENT Provider Note   CSN: [id number] Arrival date & time: 10/01/17  1258     History   Chief Complaint Chief Complaint  Patient presents with  . Headache    HPI Denise Conrad is a 27 y.o. female.  The history is provided by the patient.  Headache   This is a recurrent problem. Episode onset: 4 days. The problem occurs constantly. Progression since onset: not resolving, but pain moving to different parts of head. The headache is associated with bright light and loud noise. The pain is located in the frontal region. The pain is at a severity of 8/10. The pain does not radiate. Associated symptoms include palpitations. Pertinent negatives include no fever, no shortness of breath, no nausea and no vomiting.    Past Medical History:  Diagnosis Date  . Acute renal failure (HCC) 11/22/2014  . Acute suppurative tonsillitis 11/20/2014  . Anxiety   . Bipolar 1 disorder (HCC)   . Depression   . Fibromyalgia   . Sepsis (HCC) 11/20/2014    Patient Active Problem List   Diagnosis Date Noted  . IFG (impaired fasting glucose) 05/30/2017  . Acne vulgaris 05/28/2017  . Chronic nasopharyngitis 03/05/2017  . Chlamydia infection 07/31/2016  . Pap smear of cervix shows low risk HPV present 07/31/2016  . Obstructive sleep apnea syndrome, moderate 04/28/2016  . Iron deficiency 04/20/2016  . Chronic bilateral low back pain without sciatica 02/21/2016  . Family history of Hashimoto thyroiditis 02/21/2016  . Morbid obesity with BMI of 50.0-59.9, adult (HCC) 02/21/2016  . Scoliosis 02/21/2016  . Bipolar 1 disorder, depressed, severe (HCC) 08/19/2015  . Anxiety 11/20/2014  . Cervical dysplasia 09/28/2014    Past Surgical History:  Procedure Laterality Date  . TONSILLECTOMY    . WISDOM TOOTH EXTRACTION       OB History    Gravida  3   Para  1   Term  1   Preterm  0   AB  2   Living  1     SAB  2   TAB  0   Ectopic  0     Multiple  0   Live Births  1            Home Medications    Prior to Admission medications   Medication Sig Start Date End Date Taking? Authorizing Provider  acetaminophen (TYLENOL) 500 MG tablet Take 1,000 mg by mouth every 6 (six) hours as needed (for headaches).   Yes [provider]  ARIPiprazole (ABILIFY) 20 MG tablet Take 20 mg by mouth daily. 09/20/17  Yes [provider]  caffeine 200 MG TABS tablet Take 200 mg by mouth daily.   Yes [provider]  calcium carbonate (TUMS - DOSED IN MG ELEMENTAL CALCIUM) 500 MG chewable tablet Chew 1-2 tablets by mouth as needed for indigestion or heartburn.   Yes [provider]  cetirizine (ZYRTEC) 10 MG tablet Take 10 mg by mouth at bedtime. 06/06/16  Yes [provider]  Cyanocobalamin (B-12) 1000 MCG CAPS Take 1,000 mcg by mouth daily.  02/23/17  Yes [provider]  diclofenac (VOLTAREN) 75 MG EC tablet Take 75 mg by mouth at bedtime.   Yes [provider]  DULoxetine (CYMBALTA) 30 MG capsule Take 30 mg by mouth every morning.    Yes [provider]  DULoxetine (CYMBALTA) 60 MG capsule Take 1 capsule (60 mg total) by mouth every morning. 08/22/15  Yes Adonis Brook, NP  ferrous sulfate 324 (65 Fe) MG TBEC Take 1 tablet by mouth daily.    Yes [provider]  fesoterodine (TOVIAZ) 8 MG TB24 tablet Take 8 mg by mouth at bedtime.   Yes [provider]  ibuprofen (ADVIL,MOTRIN) 200 MG tablet Take 800 mg by mouth every 6 (six) hours as needed (for headaches).   Yes [provider]  lamoTRIgine (LAMICTAL) 150 MG tablet Take 1 tablet (150 mg total) by mouth 2 (two) times daily. 08/22/15  Yes Adonis Brook, NP  lisdexamfetamine (VYVANSE) 50 MG capsule Take 1 capsule (50 mg total) by mouth daily. 08/22/15  Yes Adonis Brook, NP  Melatonin 10 MG TABS Take 20-40 mg by mouth at bedtime as needed (for sleep).    Yes [provider]  omeprazole  (PRILOSEC) 20 MG capsule Take 20 mg by mouth at bedtime. 04/07/16 10/01/17 Yes [provider]  pregabalin (LYRICA) 150 MG capsule Take 150 mg by mouth 2 (two) times daily.   Yes [provider]  Vitamin D, Ergocalciferol, (DRISDOL) 50000 units CAPS capsule Take 50,000 Units by mouth every Monday.  02/08/17  Yes [provider]  acetaZOLAMIDE (DIAMOX) 250 MG tablet Take 2 tablets (500 mg total) by mouth 2 (two) times daily for 7 days. 10/01/17 10/08/17  Nash Dimmer, MD  fesoterodine (TOVIAZ) 4 MG TB24 tablet Take 1 tablet (4 mg total) by mouth at bedtime. Patient not taking: Reported on 10/01/2017 08/22/15   Adonis Brook, NP  levonorgestrel-ethinyl estradiol (SEASONALE,INTROVALE,JOLESSA) 0.15-0.03 MG tablet Take 1 tablet by mouth daily. 08/17/17   Reva Bores, MD  promethazine (PHENERGAN) 25 MG tablet Take 1 tablet (25 mg total) by mouth every 6 (six) hours as needed for nausea or vomiting. 06/25/16   Alvira Monday, MD    Family History Family History  Problem Relation Age of Onset  . Hashimoto's thyroiditis Mother   . Fibromyalgia Mother   . Hypertension Father   . Heart failure Father        hyptertensive cardiovascular disease    Social History Social History   Tobacco Use  . Smoking status: Never Smoker  . Smokeless tobacco: Never Used  Substance Use Topics  . Alcohol use: No  . Drug use: No     Allergies   Saphris [asenapine]; Adhesive [tape]; Topamax [topiramate]; Zofran [ondansetron]; Latex; and Sulfa antibiotics   Review of Systems Review of Systems  Constitutional: Negative for fever.  HENT: Positive for congestion. Negative for rhinorrhea and sore throat.   Eyes: Positive for visual disturbance.  Respiratory: Negative for cough and shortness of breath.   Cardiovascular: Positive for palpitations. Negative for chest pain.  Gastrointestinal: Positive for constipation. Negative for abdominal pain, diarrhea, nausea and vomiting.    Endocrine: Negative for polyuria.  Genitourinary: Negative for dysuria, flank pain and urgency.  Musculoskeletal: Negative for back pain.  Skin: Negative for rash.  Neurological: Positive for speech difficulty (speech finding difficulty for 2 weeks) and headaches.  Psychiatric/Behavioral: Negative for confusion.     Physical Exam Updated Vital Signs BP (!) 106/93   Pulse 87   Temp 98.9 F (37.2 C) (Oral)   Resp 16   Ht 5\' 6"  (1.676 m)   Wt 108 kg   SpO2 98%   BMI 38.41 kg/m   Physical Exam  Constitutional: She is oriented to person, place, and time. She appears well-developed and well-nourished. No distress.  HENT:  Head: Normocephalic and atraumatic.  Eyes: Pupils are equal, round,  and reactive to light. Conjunctivae are normal.  Unable to visualize fundus  Neck: Neck supple.  Cardiovascular: Normal rate and regular rhythm.  No murmur heard. Pulmonary/Chest: Effort normal and breath sounds normal. No respiratory distress.  Abdominal: Soft. There is no tenderness.  Musculoskeletal: She exhibits no edema.  Neurological: She is alert and oriented to person, place, and time. She has normal strength. She displays normal reflexes. No cranial nerve deficit. Gait normal. GCS eye subscore is 4. GCS verbal subscore is 5. GCS motor subscore is 6.  Ambulates with steady gait.  Skin: Skin is warm and dry.  Psychiatric: She has a normal mood and affect.  Nursing note and vitals reviewed.    ED Treatments / Results  Labs (all labs ordered are listed, but only abnormal results are displayed) Labs Reviewed  CBC - Abnormal; Notable for the following components:      Result Value   WBC 11.1 (*)    Platelets 447 (*)    All other components within normal limits  COMPREHENSIVE METABOLIC PANEL - Abnormal; Notable for the following components:   CO2 20 (*)    Glucose, Bld 126 (*)    AST 66 (*)    ALT 83 (*)    All other components within normal limits  I-STAT CHEM 8, ED -  Abnormal; Notable for the following components:   Glucose, Bld 126 (*)    TCO2 20 (*)    All other components within normal limits  PROTIME-INR  APTT  DIFFERENTIAL  I-STAT TROPONIN, ED  CBG MONITORING, ED  I-STAT BETA HCG BLOOD, ED ([facility], [facility], [facility] ONLY)    EKG None  Radiology Ct Head Wo Contrast  Result Date: 10/01/2017 CLINICAL DATA:  Headache for 4 days EXAM: CT HEAD WITHOUT CONTRAST TECHNIQUE: Contiguous axial images were obtained from the base of the skull through the vertex without intravenous contrast. COMPARISON:  November 20, 2014 FINDINGS: Brain: The ventricles are normal in size and configuration. There is no intracranial mass, hemorrhage, extra-axial fluid collection, or midline shift. Gray-white compartments appear normal. No acute infarct evident. Vascular: No hyperdense vessel. No vascular calcifications are evident. Skull: The bony calvarium appears intact. Sinuses/Orbits: There is mucosal thickening in several ethmoid air cells. Frontal sinuses are nearly aplastic. Other visualized paranasal sinuses are clear. Orbits appear symmetric bilaterally. Other: Visualized mastoid air cells are clear. IMPRESSION: Mucosal thickening in several ethmoid air cells. Study otherwise unremarkable. Electronically Signed   By: Bretta Bang III M.D.   On: 10/01/2017 14:10    Procedures Procedures (including critical care time)  Medications Ordered in ED Medications  prochlorperazine (COMPAZINE) injection 10 mg (10 mg Intravenous Given 10/01/17 1755)  diphenhydrAMINE (BENADRYL) injection 25 mg (25 mg Intravenous Given 10/01/17 1755)     Initial Impression / Assessment and Plan / ED Course  I have reviewed the triage vital signs and the nursing notes.  Pertinent labs & imaging results that were available during my care of the patient were reviewed by me and considered in my medical decision making (see chart for details).     The patient is a-year-old female with past medical history of  IIH, depression, migraines who presents with headache.  The patient reports that she has had a constant headache for the past 4 days and it migrates.  She has been a frontal headache this morning and it radiated down to her entire body.  She also reports difficulty speaking it is worse when she has pain.  On review of  her chart, it appears that she is having word finding difficulty for the last 2 years (Dr. Carmie Kanner note on 09/01/17).  Patient's CT head reveals no intracranial abnormalities. Visual acuity documented in nursing note (OU 20/32, OS 20/32, OD 20/40). Troponin negative.  Feels elevated glucose and CO2 of 20, otherwise within normal limits.  PT/INR within normal limits.  CBC shows WBC elevated at 11.1 within normal limits.  Patient reports that her headache has improved significantly after Compazine and Benadryl.  Do not suspect based on history and physical the patient is having CVA, carbon monoxide poisoning, temporal arteritis, ACS, or other emergent cause of headache.  Could be related to patient's IIH, so patient's acetazolamide dose increased and patient instructed to follow-up with her neurologist. Patient reports understanding of and agreement with discharge plan and return precautions.  Patient care supervised by Dr. Rosalia Hammers.  Nash Dimmer, MD   Final Clinical Impressions(s) / ED Diagnoses   Final diagnoses:  Nonintractable episodic headache, unspecified headache type    ED Discharge Orders         Ordered    acetaZOLAMIDE (DIAMOX) 250 MG tablet  2 times daily     10/01/17 2035           Nash Dimmer, MD 10/02/17 1610    Margarita Grizzle, MD 10/02/17 2333

## 2017-10-01 NOTE — ED Notes (Signed)
Called pt to recheck vitals. No response.  

## 2017-10-01 NOTE — ED Notes (Signed)
Patient ambulated unassisted with steady gait.

## 2018-02-18 DIAGNOSIS — K219 Gastro-esophageal reflux disease without esophagitis: Secondary | ICD-10-CM | POA: Insufficient documentation

## 2018-12-05 DIAGNOSIS — E559 Vitamin D deficiency, unspecified: Secondary | ICD-10-CM | POA: Insufficient documentation

## 2019-01-09 IMAGING — DX DG THORACIC SPINE 2V
3 series · 3 of 3 positions shown · non-contrast
Comparison: None.

CLINICAL DATA: Back injury moving heavy object. Lower thoracic back
pain and tenderness. Initial encounter.

EXAM:
THORACIC SPINE 2 VIEWS

[t-spine ap]
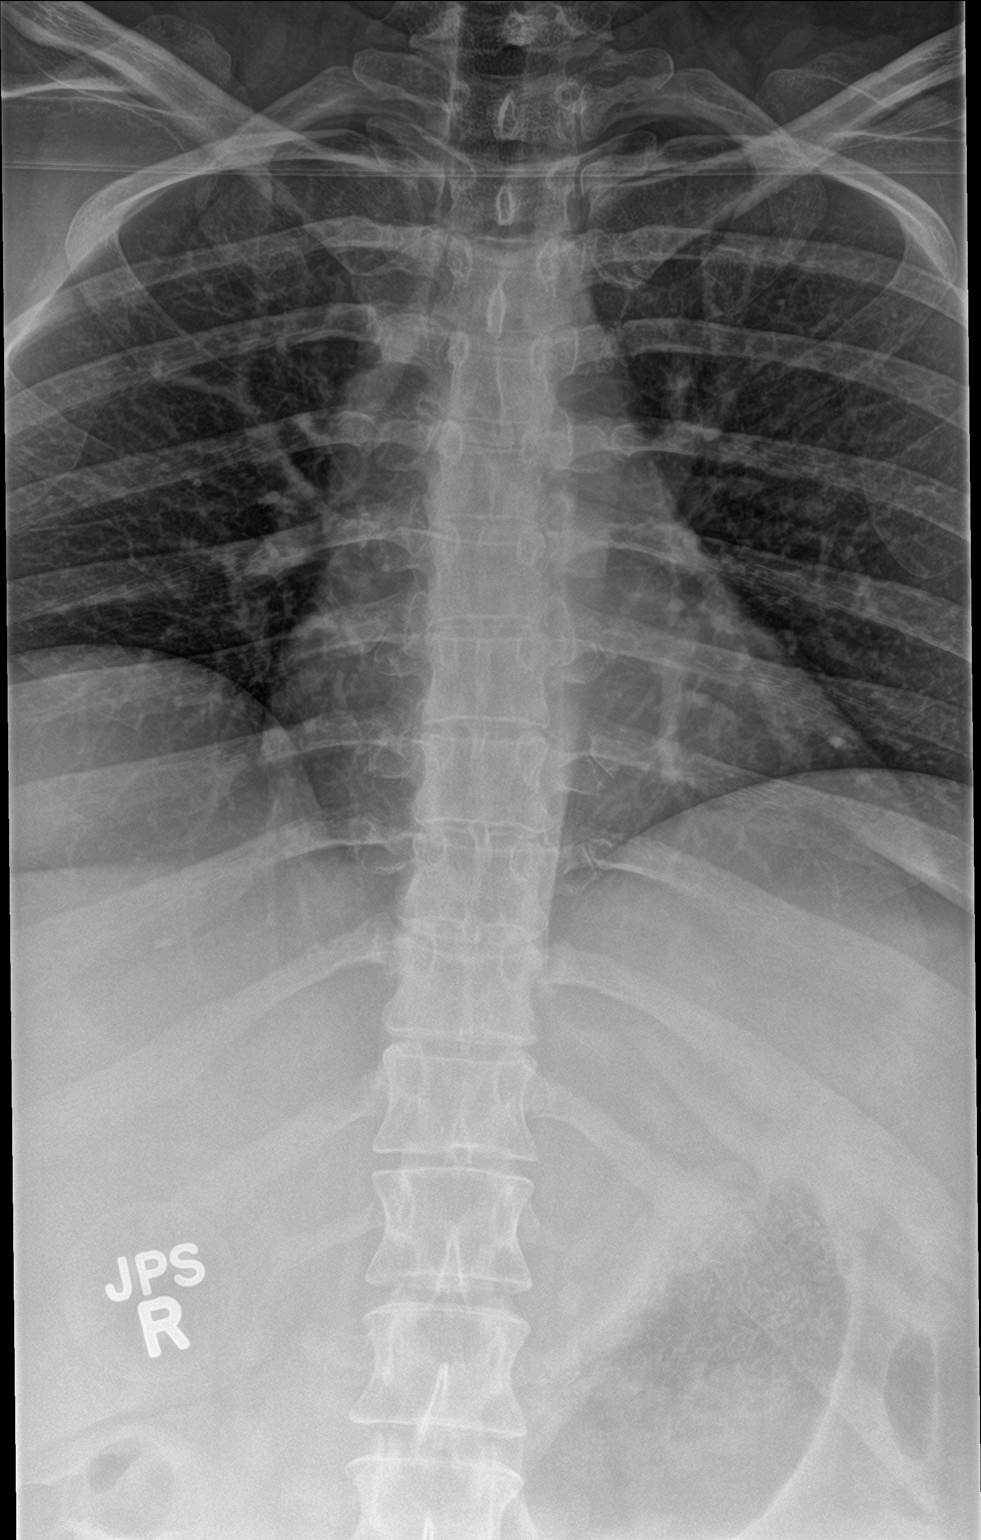

[t-spine lat]
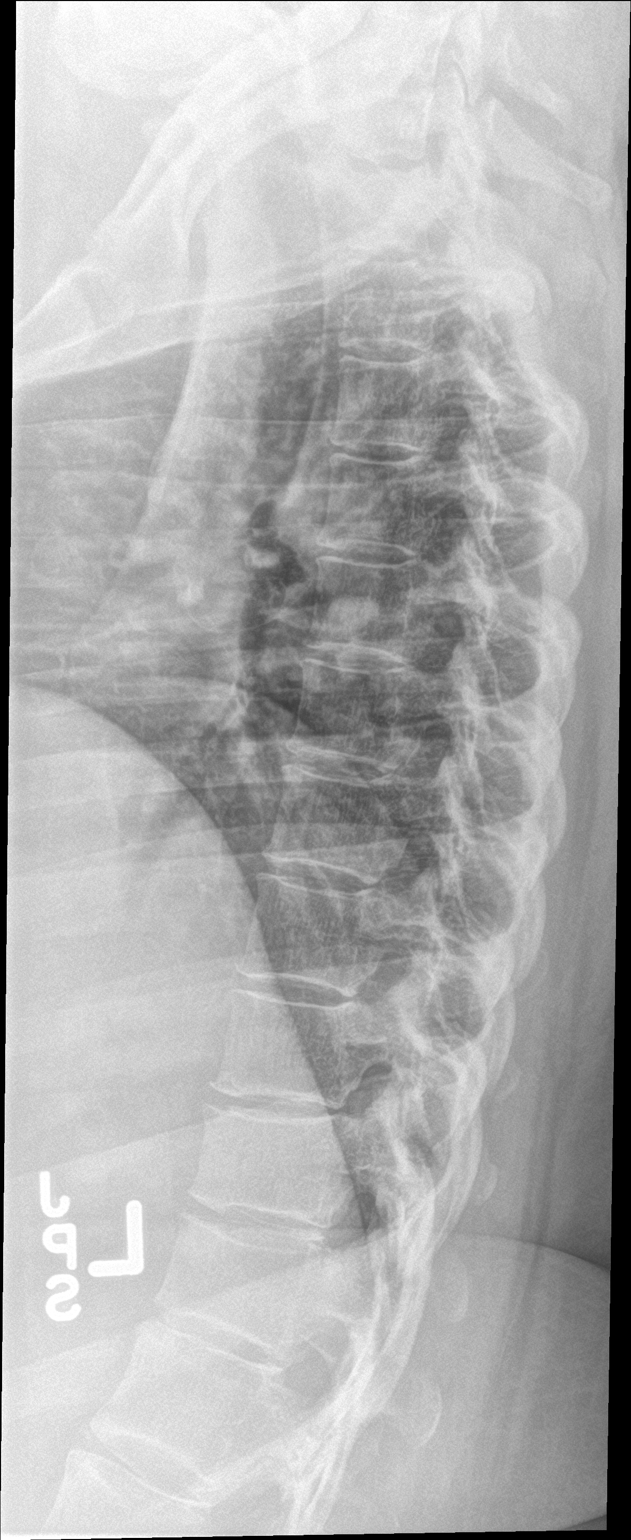

[t-spine swimmers]
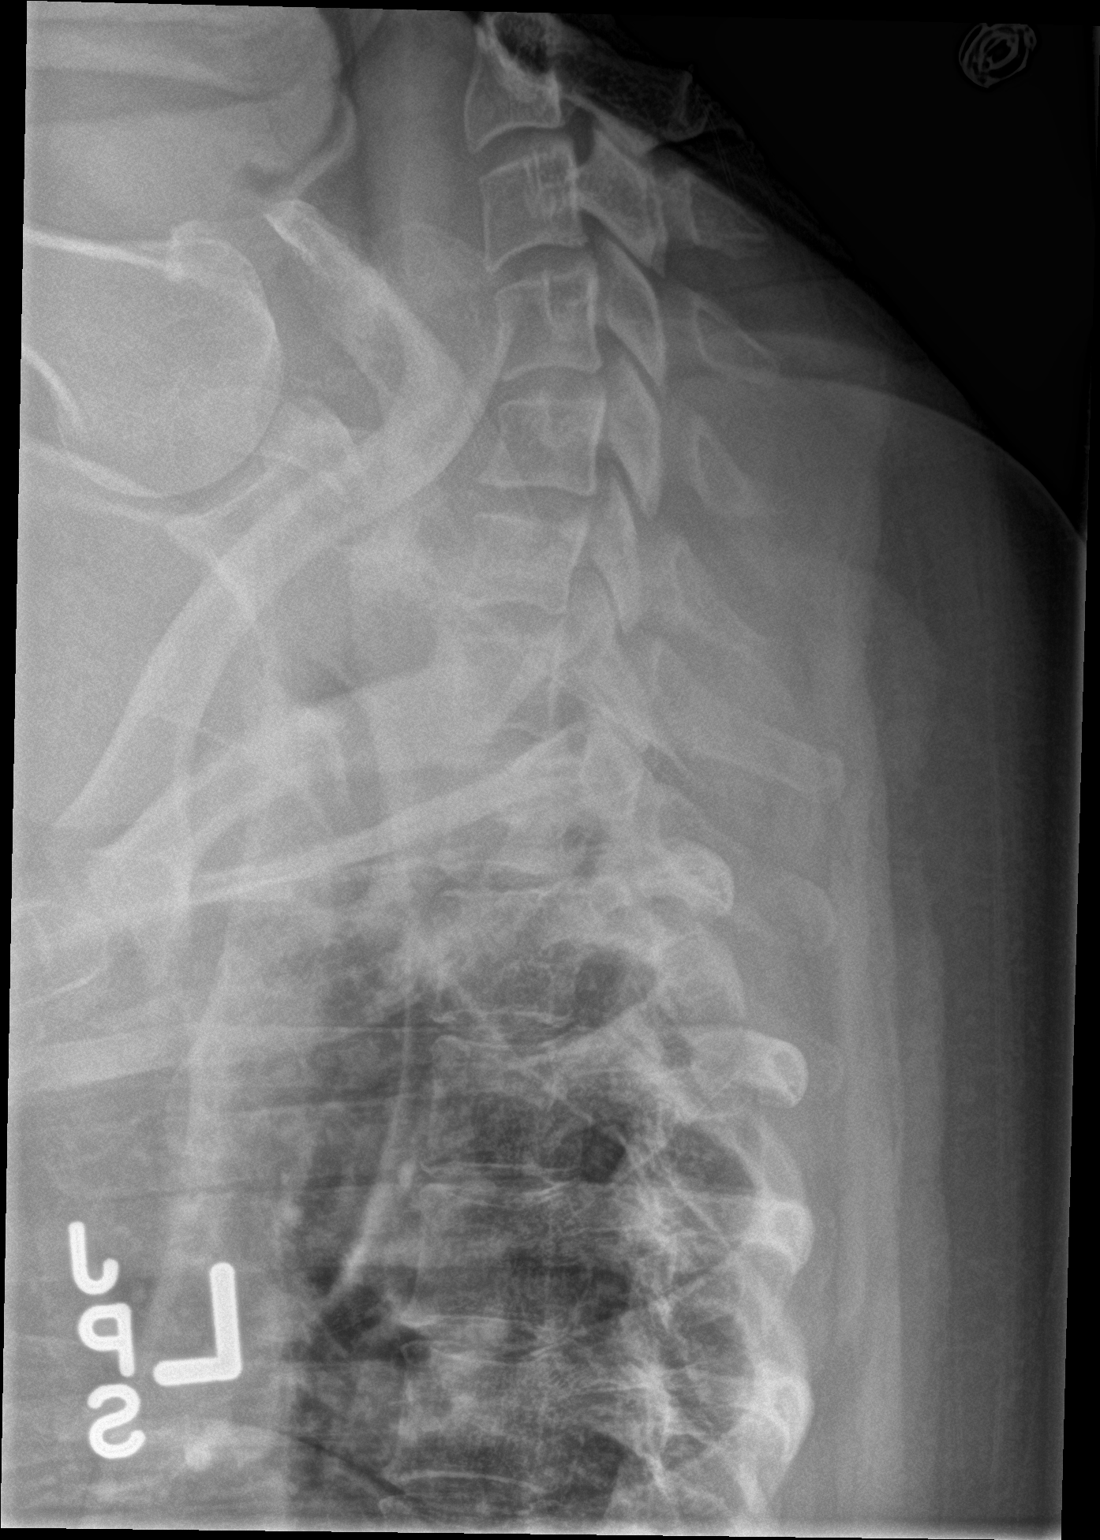

[3 of 3 positions shown; findings below may reference images not displayed]

FINDINGS: There is no evidence of thoracic spine fracture. Alignment is
normal. Intervertebral disc spaces are maintained. No other
significant bone abnormalities are identified.
IMPRESSION: Negative.

## 2019-03-09 ENCOUNTER — Encounter: Payer: Self-pay | Admitting: Family Medicine

## 2019-03-09 ENCOUNTER — Other Ambulatory Visit (HOSPITAL_COMMUNITY)
Admission: RE | Admit: 2019-03-09 | Discharge: 2019-03-09 | Disposition: A | Discharge status: Home / Self Care | Payer: Medicaid Other | Source: Ambulatory Visit | Attending: Family Medicine | Admitting: Family Medicine

## 2019-03-09 ENCOUNTER — Other Ambulatory Visit: Payer: Self-pay

## 2019-03-09 ENCOUNTER — Ambulatory Visit (INDEPENDENT_AMBULATORY_CARE_PROVIDER_SITE_OTHER): Payer: Medicaid Other | Admitting: Family Medicine

## 2019-03-09 VITALS — BP 150/93 | HR 108 | Ht 66.0 in | Wt 357.0 lb

## 2019-03-09 DIAGNOSIS — Z124 Encounter for screening for malignant neoplasm of cervix: Secondary | ICD-10-CM | POA: Diagnosis not present

## 2019-03-09 DIAGNOSIS — Z3041 Encounter for surveillance of contraceptive pills: Secondary | ICD-10-CM

## 2019-03-09 DIAGNOSIS — Z01419 Encounter for gynecological examination (general) (routine) without abnormal findings: Secondary | ICD-10-CM | POA: Diagnosis not present

## 2019-03-09 DIAGNOSIS — Z113 Encounter for screening for infections with a predominantly sexual mode of transmission: Secondary | ICD-10-CM | POA: Diagnosis not present

## 2019-03-09 NOTE — Progress Notes (Signed)
  Subjective:     Denise Conrad is a 29 y.o. female and is here for a comprehensive physical exam. 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 Switched from [employer]   The following portions of the patient's history were reviewed and updated as appropriate: allergies, current medications, past family history, past medical history, past social history, past surgical history and problem list.  Review of Systems Pertinent items noted in HPI and remainder of comprehensive ROS otherwise negative.   Objective:    BP (!) 161/93   Pulse (!) 108   Ht 5\' 6"  (1.676 m)   Wt (!) 357 lb (161.9 kg)   LMP 02/25/2019 (Approximate)   BMI 57.62 kg/m  General appearance: alert, cooperative, appears stated age and morbidly obese Head: Normocephalic, without obvious abnormality, atraumatic Neck: no adenopathy, supple, symmetrical, trachea midline and thyroid not enlarged, symmetric, no tenderness/mass/nodules Lungs: clear to auscultation bilaterally Breasts: normal appearance, no masses or tenderness Heart: regular rate and rhythm, S1, S2 normal, no murmur, click, rub or gallop Abdomen: soft, non-tender; bowel sounds normal; no masses,  no organomegaly Pelvic: cervix normal in appearance, external genitalia normal, no adnexal masses or tenderness, no cervical motion tenderness, uterus normal size, shape, and consistency, vagina normal without discharge and exam is significantly limited by body habitus Extremities: Homans sign is negative, no sign of DVT Pulses: 2+ and symmetric Skin: Skin color, texture, turgor normal. No rashes or lesions Lymph nodes: Cervical, supraclavicular, and axillary nodes normal. Neurologic: Grossly normal    Assessment:    Healthy female exam.      Plan:      Problem List Items Addressed This Visit    None    Visit Diagnoses    Screen for STD (sexually transmitted disease)    -  Primary   Relevant Orders   Hepatitis B surface antigen   Hepatitis C antibody   HIV Antibody (routine testing w rflx)   RPR   Screening for malignant neoplasm of cervix       Relevant Orders   Cytology - PAP   Encounter for gynecological examination without abnormal finding       Encounter for surveillance of contraceptive pills       BP is up--though she says it is normally ok. She might consider IUD if needed.     Return in 1 year (on 03/08/2020).  See After Visit Summary for Counseling Recommendations

## 2019-03-09 NOTE — Patient Instructions (Signed)
 Preventive Care 21-30 Years Old, Female Preventive care refers to visits with your health care provider and lifestyle choices that can promote health and wellness. This includes:  A yearly physical exam. This may also be called an annual well check.  Regular dental visits and eye exams.  Immunizations.  Screening for certain conditions.  Healthy lifestyle choices, such as eating a healthy diet, getting regular exercise, not using drugs or products that contain nicotine and tobacco, and limiting alcohol use. What can I expect for my preventive care visit? Physical exam Your health care provider will check your:  Height and weight. This may be used to calculate body mass index (BMI), which tells if you are at a healthy weight.  Heart rate and blood pressure.  Skin for abnormal spots. Counseling Your health care provider may ask you questions about your:  Alcohol, tobacco, and drug use.  Emotional well-being.  Home and relationship well-being.  Sexual activity.  Eating habits.  Work and work environment.  Method of birth control.  Menstrual cycle.  Pregnancy history. What immunizations do I need?  Influenza (flu) vaccine  This is recommended every year. Tetanus, diphtheria, and pertussis (Tdap) vaccine  You may need a Td booster every 10 years. Varicella (chickenpox) vaccine  You may need this if you have not been vaccinated. Human papillomavirus (HPV) vaccine  If recommended by your health care provider, you may need three doses over 6 months. Measles, mumps, and rubella (MMR) vaccine  You may need at least one dose of MMR. You may also need a second dose. Meningococcal conjugate (MenACWY) vaccine  One dose is recommended if you are age 19-21 years and a first-year college student living in a residence hall, or if you have one of several medical conditions. You may also need additional booster doses. Pneumococcal conjugate (PCV13) vaccine  You may need  this if you have certain conditions and were not previously vaccinated. Pneumococcal polysaccharide (PPSV23) vaccine  You may need one or two doses if you smoke cigarettes or if you have certain conditions. Hepatitis A vaccine  You may need this if you have certain conditions or if you travel or work in places where you may be exposed to hepatitis A. Hepatitis B vaccine  You may need this if you have certain conditions or if you travel or work in places where you may be exposed to hepatitis B. Haemophilus influenzae type b (Hib) vaccine  You may need this if you have certain conditions. You may receive vaccines as individual doses or as more than one vaccine together in one shot (combination vaccines). Talk with your health care provider about the risks and benefits of combination vaccines. What tests do I need?  Blood tests  Lipid and cholesterol levels. These may be checked every 5 years starting at age 20.  Hepatitis C test.  Hepatitis B test. Screening  Diabetes screening. This is done by checking your blood sugar (glucose) after you have not eaten for a while (fasting).  Sexually transmitted disease (STD) testing.  BRCA-related cancer screening. This may be done if you have a family history of breast, ovarian, tubal, or peritoneal cancers.  Pelvic exam and Pap test. This may be done every 3 years starting at age 21. Starting at age 30, this may be done every 5 years if you have a Pap test in combination with an HPV test. Talk with your health care provider about your test results, treatment options, and if necessary, the need for more   tests. Follow these instructions at home: Eating and drinking   Eat a diet that includes fresh fruits and vegetables, whole grains, lean protein, and low-fat dairy.  Take vitamin and mineral supplements as recommended by your health care provider.  Do not drink alcohol if: ? Your health care provider tells you not to drink. ? You are  pregnant, may be pregnant, or are planning to become pregnant.  If you drink alcohol: ? Limit how much you have to 0-1 drink a day. ? Be aware of how much alcohol is in your drink. In the [place] one drink equals one 12 oz bottle of beer (355 mL), one 5 oz glass of wine (148 mL), or one 1 oz glass of hard liquor (44 mL). Lifestyle  Take daily care of your teeth and gums.  Stay active. Exercise for at least 30 minutes on 5 or more days each week.  Do not use any products that contain nicotine or tobacco, such as cigarettes, e-cigarettes, and chewing tobacco. If you need help quitting, ask your health care provider.  If you are sexually active, practice safe sex. Use a condom or other form of birth control (contraception) in order to prevent pregnancy and STIs (sexually transmitted infections). If you plan to become pregnant, see your health care provider for a preconception visit. What's next?  Visit your health care provider once a year for a well check visit.  Ask your health care provider how often you should have your eyes and teeth checked.  Stay up to date on all vaccines. This information is not intended to replace advice given to you by your health care provider. Make sure you discuss any questions you have with your health care provider. Document Revised: 09/23/2017 Document Reviewed: 09/23/2017 Elsevier Patient Education  2020 [employer]  

## 2019-03-10 LAB — HEPATITIS C ANTIBODY: Hep C Virus Ab: <0.1 s/co ratio (ref 0.0–0.9)

## 2019-03-10 LAB — HIV ANTIBODY (ROUTINE TESTING W REFLEX): HIV Screen 4th Generation wRfx: NONREACTIVE

## 2019-03-10 LAB — RPR: RPR Ser Ql: NONREACTIVE

## 2019-03-10 LAB — HEPATITIS B SURFACE ANTIGEN: Hepatitis B Surface Ag: NEGATIVE

## 2019-03-14 LAB — CYTOLOGY - PAP
Chlamydia: NEGATIVE
Comment: NEGATIVE
Comment: NEGATIVE
Comment: NEGATIVE
Comment: NORMAL
Diagnosis: UNDETERMINED — AB
High risk HPV: NEGATIVE
Neisseria Gonorrhea: NEGATIVE
Trichomonas: NEGATIVE

## 2019-05-02 ENCOUNTER — Encounter: Payer: Self-pay | Admitting: *Deleted

## 2019-05-16 ENCOUNTER — Other Ambulatory Visit: Payer: Self-pay

## 2019-05-16 ENCOUNTER — Ambulatory Visit (INDEPENDENT_AMBULATORY_CARE_PROVIDER_SITE_OTHER): Payer: Medicaid Other | Admitting: Obstetrics and Gynecology

## 2019-05-16 ENCOUNTER — Encounter: Payer: Self-pay | Admitting: Obstetrics and Gynecology

## 2019-05-16 VITALS — BP 152/98 | HR 123 | Wt 345.0 lb

## 2019-05-16 DIAGNOSIS — Z3009 Encounter for other general counseling and advice on contraception: Secondary | ICD-10-CM | POA: Diagnosis not present

## 2019-05-16 DIAGNOSIS — Z30430 Encounter for insertion of intrauterine contraceptive device: Secondary | ICD-10-CM

## 2019-05-16 DIAGNOSIS — Z3202 Encounter for pregnancy test, result negative: Secondary | ICD-10-CM | POA: Diagnosis not present

## 2019-05-16 HISTORY — PX: IUD INSERTION: OBO1003

## 2019-05-16 LAB — POCT URINE PREGNANCY: Preg Test, Ur: NEGATIVE

## 2019-05-16 MED ORDER — LEVONORGESTREL 19.5 MCG/DAY IU IUD: INTRAUTERINE_SYSTEM | Freq: Once | INTRAUTERINE | Status: AC

## 2019-05-16 NOTE — Procedures (Signed)
Intrauterine Device (IUD) Insertion Procedure Note  After a recent pap (results: ASCUS-HPV negative /date: 2021) and recent negative gonorrhea-chlamydia testing (date: 2021) was confirmed, written consent was obtained; her urine pregnancy test was: negative The patient understands the risks of IUD placement, which include but are not limited to: bleeding, infection, uterine perforation, risk of expulsion, risk of failure < 1%, increased risk of ectopic pregnancy in the event of failure.   Prior to the procedure being performed, the patient (or guardian) was asked to state their full name, date of birth, and the type of procedure being performed. A bimanual exam showed the uterus to be midposition.  Next, the cervix and vagina were cleaned with an antiseptic solution, and the cervix was grasped with a tenaculum.  The uterus was sounded to 7 cm.  The Liletta was placed without difficulty in the usual fashion.  The strings were cut to 4 cm.  The tenaculum was removed and cervix was found to be hemostatic.    No complications, patient tolerated the procedure well.  Cornelia Copa MD Attending Center for [employer] [facility])

## 2019-05-16 NOTE — Progress Notes (Signed)
Obstetrics and Gynecology Visit Return Patient Evaluation  Appointment Date: 05/16/2019  Primary Care Provider: Ladora Daniel  OBGYN Clinic: [facility]  Chief Complaint: contraception management  History of Present Illness:  Denise Conrad is a 29 y.o. P1 with above CC. Patient on combined OCPs and needs to switch due to her co-morbidities. She previously has only tried nexplanon and that caused weight and mood issues. LMP started yesterday. Pt was on the nexplanon for about two years but remembers her periods being "intense" prior to being on hormones. She has been doing research and is interested in the IUD.   Review of Systems: as noted in the History of Present Illness.   Patient Active Problem List   Diagnosis Date Noted  . Vitamin D deficiency 12/05/2018  . Gastro-esophageal reflux disease without esophagitis 02/18/2018  . IFG (impaired fasting glucose) 05/30/2017  . Acne vulgaris 05/28/2017  . Chronic nasopharyngitis 03/05/2017  . Chlamydia infection 07/31/2016  . Pap smear of cervix shows low risk HPV present 07/31/2016  . Obstructive sleep apnea syndrome, moderate 04/28/2016  . Iron deficiency 04/20/2016  . Chronic bilateral low back pain without sciatica 02/21/2016  . Family history of Hashimoto thyroiditis 02/21/2016  . Morbid obesity with BMI of 50.0-59.9, adult (HCC) 02/21/2016  . Scoliosis 02/21/2016  . Bipolar 1 disorder, depressed, severe (HCC) 08/19/2015  . Anxiety 11/20/2014   Medications:  Manie L. Wilkie had no medications administered during this visit. Current Outpatient Medications  Medication Sig Dispense Refill  . ARIPiprazole (ABILIFY) 20 MG tablet Take 20 mg by mouth daily.  2  . baclofen (LIORESAL) 10 MG tablet TAKE 1 TABLET BY MOUTH THREE TIMES A DAY AS NEEDED    . calcium carbonate (TUMS - DOSED IN MG ELEMENTAL CALCIUM) 500 MG chewable tablet Chew 1-2 tablets by mouth as needed for indigestion or heartburn.     . cetirizine (ZYRTEC) 10 MG tablet Take 10 mg by mouth at bedtime.  12  . Cyanocobalamin (B-12) 1000 MCG CAPS Take 1,000 mcg by mouth daily.     . fesoterodine (TOVIAZ) 8 MG TB24 tablet Take 8 mg by mouth at bedtime.    Marland Kitchen ibuprofen (ADVIL,MOTRIN) 200 MG tablet Take 800 mg by mouth every 6 (six) hours as needed (for headaches).    Marland Kitchen lithium carbonate 300 MG capsule Take 300 mg by mouth daily.    . Melatonin 10 MG TABS Take 20-40 mg by mouth at bedtime as needed (for sleep).     . pregabalin (LYRICA) 150 MG capsule Take 150 mg by mouth 2 (two) times daily.    . Solriamfetol HCl (SUNOSI PO) Take by mouth.    . venlafaxine (EFFEXOR) 100 MG tablet Take 225 mg by mouth daily.    . Vitamin D, Ergocalciferol, (DRISDOL) 50000 units CAPS capsule Take 50,000 Units by mouth every Monday.     Marland Kitchen acetaminophen (TYLENOL) 500 MG tablet Take 1,000 mg by mouth every 6 (six) hours as needed (for headaches).    . caffeine 200 MG TABS tablet Take 200 mg by mouth daily.    . diclofenac (VOLTAREN) 75 MG EC tablet Take 75 mg by mouth at bedtime.    . ferrous sulfate 324 (65 Fe) MG TBEC Take 1 tablet by mouth daily.     Marland Kitchen levonorgestrel-ethinyl estradiol (SEASONALE,INTROVALE,JOLESSA) 0.15-0.03 MG tablet Take 1 tablet by mouth daily. 1 Package 4  . lisdexamfetamine (VYVANSE) 50 MG capsule Take 1 capsule (50 mg total) by mouth daily. 30 capsule  0  . promethazine (PHENERGAN) 25 MG tablet Take 1 tablet (25 mg total) by mouth every 6 (six) hours as needed for nausea or vomiting. (Patient not taking: Reported on 05/16/2019) 30 tablet 0   No current facility-administered medications for this visit.    Allergies: is allergic to saphris [asenapine]; adhesive [tape]; topamax [topiramate]; zofran [ondansetron]; latex; and sulfa antibiotics.  Physical Exam:  BP (!) 152/98   Pulse (!) 123   Wt (!) 345 lb (156.5 kg)   LMP 05/15/2019 (Exact Date)   BMI 55.68 kg/m  Body mass index is 55.68 kg/m. General appearance: Well  nourished, well developed female in no acute distress.  Neuro/Psych:  Normal mood and affect.    See procedure note for Liletta IUD insertion procedure note  Assessment: pt doing well s/p liletta insertion  Plan:  1. Encounter for IUD insertion Long d/w patient re: options. She is sure that she's done with having more children. She states that she sees bariatric surgery in the [facility] next week and hopes to have surgery in a few months. I told her that if that is the case strong consideration should be made towards getting a BTL. I told her that it's up to the bariatric surgeon's discretion but hopefully having GYN do a BTL at the time of surgery could be done. Despite this, she'd like to do an IUD today, which was done. I told her to bring up a BTL to her surgeon next week and if that's what she wants and looks feasible to have them refer her to a GYN in that system that could do it at the same time as her weight loss surgery.  - POCT urine pregnancy   RTC: 13m string check.   Durene Romans MD Attending Center for [employer] [facility] farm manager)

## 2019-05-26 DIAGNOSIS — K760 Fatty (change of) liver, not elsewhere classified: Secondary | ICD-10-CM | POA: Insufficient documentation

## 2019-06-15 ENCOUNTER — Ambulatory Visit: Payer: Medicaid Other | Admitting: Obstetrics and Gynecology

## 2019-09-26 ENCOUNTER — Telehealth (INDEPENDENT_AMBULATORY_CARE_PROVIDER_SITE_OTHER): Payer: Medicaid Other | Admitting: Psychiatry

## 2019-09-26 ENCOUNTER — Other Ambulatory Visit: Payer: Self-pay

## 2019-09-26 ENCOUNTER — Encounter: Payer: Self-pay | Admitting: Psychiatry

## 2019-09-26 DIAGNOSIS — F900 Attention-deficit hyperactivity disorder, predominantly inattentive type: Secondary | ICD-10-CM

## 2019-09-26 DIAGNOSIS — F410 Panic disorder [episodic paroxysmal anxiety] without agoraphobia: Secondary | ICD-10-CM

## 2019-09-26 DIAGNOSIS — F3162 Bipolar disorder, current episode mixed, moderate: Secondary | ICD-10-CM

## 2019-09-26 DIAGNOSIS — Z79899 Other long term (current) drug therapy: Secondary | ICD-10-CM

## 2019-09-26 DIAGNOSIS — F4310 Post-traumatic stress disorder, unspecified: Secondary | ICD-10-CM | POA: Diagnosis not present

## 2019-09-26 DIAGNOSIS — G479 Sleep disorder, unspecified: Secondary | ICD-10-CM

## 2019-09-26 MED ORDER — HYDROXYZINE HCL 25 MG PO TABS: 25.0000 mg | ORAL_TABLET | Freq: Every day | ORAL | 1 refills | Status: DC | PRN

## 2019-09-26 MED ORDER — LURASIDONE HCL 20 MG PO TABS: 20.0000 mg | ORAL_TABLET | Freq: Every day | ORAL | 1 refills | Status: DC

## 2019-09-26 MED ORDER — LISDEXAMFETAMINE DIMESYLATE 60 MG PO CAPS: 60.0000 mg | ORAL_CAPSULE | ORAL | 0 refills | Status: DC
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
## 2019-10-16 ENCOUNTER — Telehealth: Payer: Self-pay

## 2019-10-16 DIAGNOSIS — F900 Attention-deficit hyperactivity disorder, predominantly inattentive type: Secondary | ICD-10-CM

## 2019-10-16 MED ORDER — LISDEXAMFETAMINE DIMESYLATE 60 MG PO CAPS: 60.0000 mg | ORAL_CAPSULE | ORAL | 0 refills | Status: DC

## 2019-10-16 NOTE — Telephone Encounter (Signed)
I have reviewed cardiology notes in the system.  Patient has no contraindication to Vyvanse.  We will send Vyvanse 60 mg to pharmacy today.

## 2019-10-16 NOTE — Telephone Encounter (Signed)
pt called states she needs refills on vyvanse.

## 2019-10-18 ENCOUNTER — Other Ambulatory Visit: Payer: Self-pay | Admitting: Psychiatry

## 2019-10-18 DIAGNOSIS — F4310 Post-traumatic stress disorder, unspecified: Secondary | ICD-10-CM

## 2019-11-02 ENCOUNTER — Encounter: Payer: Self-pay | Admitting: Psychiatry

## 2019-11-02 ENCOUNTER — Telehealth (INDEPENDENT_AMBULATORY_CARE_PROVIDER_SITE_OTHER): Payer: Medicaid Other | Admitting: Psychiatry

## 2019-11-02 ENCOUNTER — Other Ambulatory Visit: Payer: Self-pay

## 2019-11-02 DIAGNOSIS — F410 Panic disorder [episodic paroxysmal anxiety] without agoraphobia: Secondary | ICD-10-CM | POA: Diagnosis not present

## 2019-11-02 DIAGNOSIS — F4310 Post-traumatic stress disorder, unspecified: Secondary | ICD-10-CM

## 2019-11-02 DIAGNOSIS — F900 Attention-deficit hyperactivity disorder, predominantly inattentive type: Secondary | ICD-10-CM | POA: Diagnosis not present

## 2019-11-02 DIAGNOSIS — G479 Sleep disorder, unspecified: Secondary | ICD-10-CM

## 2019-11-02 DIAGNOSIS — Z79899 Other long term (current) drug therapy: Secondary | ICD-10-CM

## 2019-11-02 DIAGNOSIS — F3162 Bipolar disorder, current episode mixed, moderate: Secondary | ICD-10-CM | POA: Diagnosis not present

## 2019-11-02 MED ORDER — LURASIDONE HCL 40 MG PO TABS: 40.0000 mg | ORAL_TABLET | Freq: Every day | ORAL | 0 refills | Status: DC

## 2019-11-02 MED ORDER — BUSPIRONE HCL 15 MG PO TABS: 15.0000 mg | ORAL_TABLET | Freq: Two times a day (BID) | ORAL | 1 refills | Status: DC

## 2019-11-02 MED ORDER — LISDEXAMFETAMINE DIMESYLATE 60 MG PO CAPS: 60.0000 mg | ORAL_CAPSULE | ORAL | 0 refills | Status: DC

## 2019-11-02 MED ORDER — CLONAZEPAM 0.5 MG PO TABS: 0.5000 mg | ORAL_TABLET | Freq: Every day | ORAL | 0 refills | Status: DC | PRN

## 2019-11-02 MED ORDER — VENLAFAXINE HCL ER 150 MG PO CP24: 150.0000 mg | ORAL_CAPSULE | Freq: Every day | ORAL | 0 refills | Status: DC

## 2019-11-02 NOTE — Patient Instructions (Signed)
Buspirone tablets What is this medicine? BUSPIRONE (byoo SPYE rone) is used to treat anxiety disorders. This medicine may be used for other purposes; ask your health care provider or pharmacist if you have questions. COMMON BRAND NAME(S): BuSpar What should I tell my health care provider before I take this medicine? They need to know if you have any of these conditions:  kidney or liver disease  an unusual or allergic reaction to buspirone, other medicines, foods, dyes, or preservatives  pregnant or trying to get pregnant  breast-feeding How should I use this medicine? Take this medicine by mouth with a glass of water. Follow the directions on the prescription label. You may take this medicine with or without food. To ensure that this medicine always works the same way for you, you should take it either always with or always without food. Take your doses at regular intervals. Do not take your medicine more often than directed. Do not stop taking except on the advice of your doctor or health care professional. Talk to your pediatrician regarding the use of this medicine in children. Special care may be needed. Overdosage: If you think you have taken too much of this medicine contact a poison control center or emergency room at once. NOTE: This medicine is only for you. Do not share this medicine with others. What if I miss a dose? If you miss a dose, take it as soon as you can. If it is almost time for your next dose, take only that dose. Do not take double or extra doses. What may interact with this medicine? Do not take this medicine with any of the following medications:  linezolid  MAOIs like Carbex, Eldepryl, Marplan, Nardil, and Parnate  methylene blue  procarbazine This medicine may also interact with the following medications:  diazepam  digoxin  diltiazem  erythromycin  grapefruit juice  haloperidol  medicines for mental depression or mood problems  medicines  for seizures like carbamazepine, phenobarbital and phenytoin  nefazodone  other medications for anxiety  rifampin  ritonavir  some antifungal medicines like itraconazole, ketoconazole, and voriconazole  verapamil  warfarin This list may not describe all possible interactions. Give your health care provider a list of all the medicines, herbs, non-prescription drugs, or dietary supplements you use. Also tell them if you smoke, drink alcohol, or use illegal drugs. Some items may interact with your medicine. What should I watch for while using this medicine? Visit your doctor or health care professional for regular checks on your progress. It may take 1 to 2 weeks before your anxiety gets better. You may get drowsy or dizzy. Do not drive, use machinery, or do anything that needs mental alertness until you know how this drug affects you. Do not stand or sit up quickly, especially if you are an older patient. This reduces the risk of dizzy or fainting spells. Alcohol can make you more drowsy and dizzy. Avoid alcoholic drinks. What side effects may I notice from receiving this medicine? Side effects that you should report to your doctor or health care professional as soon as possible:  blurred vision or other vision changes  chest pain  confusion  difficulty breathing  feelings of hostility or anger  muscle aches and pains  numbness or tingling in hands or feet  ringing in the ears  skin rash and itching  vomiting  weakness Side effects that usually do not require medical attention (report to your doctor or health care professional if they continue or   are bothersome):  disturbed dreams, nightmares  headache  nausea  restlessness or nervousness  sore throat and nasal congestion  stomach upset This list may not describe all possible side effects. Call your doctor for medical advice about side effects. You may report side effects to FDA at 1-800-FDA-1088. Where should I  keep my medicine? Keep out of the reach of children. Store at room temperature below 30 degrees C (86 degrees F). Protect from light. Keep container tightly closed. Throw away any unused medicine after the expiration date. NOTE: This sheet is a summary. It may not cover all possible information. If you have questions about this medicine, talk to your doctor, pharmacist, or health care provider.  2020 Elsevier/Gold Standard (2009-08-22 18:06:11)  

## 2019-11-02 NOTE — Progress Notes (Signed)
Provider Location : [facility] Patient Location : Home  Participants: Patient , Provider  Virtual Visit via Video Note  I connected with Denise Conrad on 11/02/19 at  1:00 PM EDT by a video enabled telemedicine application and verified that I am speaking with the correct person using two identifiers.   I discussed the limitations of evaluation and management by telemedicine and the availability of in person appointments. The patient expressed understanding and agreed to proceed.    I discussed the assessment and treatment plan with the patient. The patient was provided an opportunity to ask questions and all were answered. The patient agreed with the plan and demonstrated an understanding of the instructions.   The patient was advised to call back or seek an in-person evaluation if the symptoms worsen or if the condition fails to improve as anticipated.   BH MD OP Progress Note  11/02/2019 4:18 PM Denise Conrad  MRN:  [id number]  Chief Complaint:  Chief Complaint    Follow-up     HPI: Denise Conrad is a 29 year old Caucasian female, unemployed, has applied for disability, has a history of bipolar disorder, PTSD, ADHD, panic attacks, sleep problems, sinus tachycardia was evaluated by telemedicine today.  Patient today reports she ran out of her Jordan a week ago.  She hence is currently going through a manic episode.  She was able to get back on the Jordan a few days ago however she continues to struggle with anxiety, agitation, hypersexuality, sleep problems, inability to sleep at night, being hyperactive, racing thoughts.  Patient reports she is interested in increasing the dosage of Latuda.  Patient continues to take Vyvanse.  She had cardiology visits done recently and was cleared by cardiologist to stay on the Vyvanse.  She continues to be tachycardic and has upcoming appointment with cardiology.  She reports the Vyvanse is helpful with her excessive sleepiness during  the day and also focus and concentration.  She is also following up with a sleep specialist and is currently on the [facility] for excessive sleepiness during the day.  She has a diagnosis of obstructive sleep apnea and is compliant on CPAP.  She reports she had sleep study done which ruled out narcolepsy.  Patient continues to report anxiety attacks.  She reports her son currently stays with her parents and this worries her a lot.  She reports she has a lot of racing heart rate, nervousness and being on edge which can last for 2 to 3 hours when she has any situational stressors.  She is currently on a higher dosage of venlafaxine which does not seem to be beneficial.  Patient denies any suicidality, homicidality .  Patient however does report she feels paranoid often.  She reports she feels as though people are watching her through her window often.  She does not know if this is coming from the trauma that she went through previously.  She is agreeable to continue psychotherapy sessions and reports her therapy sessions as beneficial.  [facility]   Visit Diagnosis:    ICD-10-CM   1. Bipolar 1 disorder, mixed, moderate (HCC)  F31.62 lurasidone (LATUDA) 40 MG TABS tablet  2. PTSD (post-traumatic stress disorder)  F43.10 venlafaxine XR (EFFEXOR XR) 150 MG 24 hr capsule    busPIRone (BUSPAR) 15 MG tablet  3. Attention deficit hyperactivity disorder (ADHD), predominantly inattentive type  F90.0 lisdexamfetamine (VYVANSE) 60 MG capsule  4. Panic attacks  F41.0 clonazePAM (KLONOPIN)  0.5 MG tablet    busPIRone (BUSPAR) 15 MG tablet  5. Sleep disorder  G47.9   6. High risk medication use  Z79.899     Past Psychiatric History: I have reviewed past psychiatric history from my progress note on 09/26/2019.  Past trials of lithium, Lamictal, Zyprexa, Cymbalta, Vyvanse, Wellbutrin, Abilify, Prozac, Lexapro, Paxil, Zoloft  Past Medical History:  Past Medical History:   Diagnosis Date  . Acute renal failure (HCC) 11/22/2014  . Acute suppurative tonsillitis 11/20/2014  . Anxiety   . Bipolar 1 disorder (HCC)   . Depression   . Fibromyalgia   . 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 Sepsis (HCC) 11/20/2014    Past Surgical History:  Procedure Laterality Date  . IUD INSERTION  05/16/2019      . TONSILLECTOMY    . WISDOM TOOTH EXTRACTION      Family Psychiatric History: I have reviewed family psychiatric history from my progress note on 09/26/2019  Family History:  Family History  Problem Relation Age of Onset  . Hashimoto's thyroiditis Mother   . Fibromyalgia Mother   . Hypertension Father   . Heart failure Father        hyptertensive cardiovascular disease  . Schizophrenia Father   . Suicidality Maternal Grandmother   . Bipolar disorder Paternal Grandmother     Social History: I have reviewed social history from my progress note on 09/26/2019 Social History   Socioeconomic History  . Marital status: Single    Spouse name: Not on file  . Number of children: Not on file  . Years of education: Not on file  . Highest education level: Not on file  Occupational History  . Not on file  Tobacco Use  . Smoking status: Never Smoker  . Smokeless tobacco: Never Used  Vaping Use  . Vaping Use: Never used  Substance and Sexual Activity  . Alcohol use: No  . Drug use: No  . Sexual activity: Yes    Partners: Male  Other Topics Concern  . Not on file  Social History Narrative  . Not on file   Social Determinants of Health   Financial Resource Strain:   . Difficulty of Paying Living Expenses: Not on file  Food Insecurity:   . Worried About Programme researcher, broadcasting/film/video in the Last Year: Not on file  . Ran Out of Food in the Last Year: Not on file  Transportation Needs:   . Lack of Transportation (Medical): Not on file  . Lack of Transportation (Non-Medical): Not on file  Physical Activity:   . Days of Exercise per Week: Not on file  . Minutes of  Exercise per Session: Not on file  Stress:   . Feeling of Stress : Not on file  Social Connections:   . Frequency of Communication with Friends and Family: Not on file  . Frequency of Social Gatherings with Friends and Family: Not on file  . Attends Religious Services: Not on file  . Active Member of Clubs or Organizations: Not on file  . Attends Banker Meetings: Not on file  . Marital Status: Not on file    Allergies:  Allergies  Allergen Reactions  . Saphris [Asenapine] Shortness Of Breath and Other (See Comments)    Migraines, also  . Adhesive [Tape] Itching  . Topamax [Topiramate] Other (See Comments)    Gave her migraines that made her pass out  . Zofran [Ondansetron] Hives  . Latex Rash  .  Sulfa Antibiotics Rash    Metabolic Disorder Labs: Lab Results  Component Value Date   HGBA1C 5.6 08/21/2015   MPG 114 08/21/2015   Lab Results  Component Value Date   PROLACTIN 35.1 (H) 08/21/2015   PROLACTIN 68.9 (H) 08/20/2015   Lab Results  Component Value Date   CHOL 208 (H) 08/20/2015   TRIG 212 (H) 08/20/2015   HDL 58 08/20/2015   CHOLHDL 3.6 08/20/2015   VLDL 42 (H) 08/20/2015   LDLCALC 108 (H) 08/20/2015   Lab Results  Component Value Date   TSH 3.976 08/20/2015    Therapeutic Level Labs: No results found for: LITHIUM No results found for: VALPROATE No components found for:  CBMZ  Current Medications: Current Outpatient Medications  Medication Sig Dispense Refill  . acetaminophen (TYLENOL) 500 MG tablet Take 1,000 mg by mouth every 6 (six) hours as needed (for headaches).    . baclofen (LIORESAL) 10 MG tablet TAKE 1 TABLET BY MOUTH THREE TIMES A DAY AS NEEDED    . BOTOX 200 units SOLR     . busPIRone (BUSPAR) 15 MG tablet Take 1 tablet (15 mg total) by mouth 2 (two) times daily. 60 tablet 1  . calcium carbonate (OS-CAL) 1250 (500 Ca) MG chewable tablet Chew by mouth. (Patient not taking: Reported on 09/26/2019)    . calcium carbonate (TUMS -  DOSED IN MG ELEMENTAL CALCIUM) 500 MG chewable tablet Chew 1-2 tablets by mouth as needed for indigestion or heartburn. (Patient not taking: Reported on 09/26/2019)    . cetirizine (ZYRTEC) 10 MG tablet Take 10 mg by mouth at bedtime.  12  . clonazePAM (KLONOPIN) 0.5 MG tablet Take 1 tablet (0.5 mg total) by mouth daily as needed for anxiety. Start taking once a day as needed for severe anxiety attacks only 10 tablet 0  . Cyanocobalamin (B-12) 1000 MCG CAPS Take 1,000 mcg by mouth daily.     . fesoterodine (TOVIAZ) 8 MG TB24 tablet Take 8 mg by mouth at bedtime.    . hydrOXYzine (ATARAX/VISTARIL) 25 MG tablet TAKE 1-2 TABLETS (25-50 MG TOTAL) BY MOUTH DAILY AS NEEDED. FOR SEVERE PANIC ATTACKS ONLY 180 tablet 1  . ibuprofen (ADVIL,MOTRIN) 200 MG tablet Take 800 mg by mouth every 6 (six) hours as needed (for headaches).    Marland Kitchen. levonorgestrel (LILETTA, 52 MG,) 19.5 MCG/DAY IUD IUD 1 each by Intrauterine route once.    . lisdexamfetamine (VYVANSE) 60 MG capsule Take 1 capsule (60 mg total) by mouth every morning. 30 capsule 0  . [START ON 11/17/2019] lisdexamfetamine (VYVANSE) 60 MG capsule Take 1 capsule (60 mg total) by mouth every morning. Take daily at 8:30 AM 30 capsule 0  . lurasidone (LATUDA) 40 MG TABS tablet Take 1 tablet (40 mg total) by mouth daily with supper. 90 tablet 0  . Melatonin 10 MG TABS Take 20-40 mg by mouth at bedtime as needed (for sleep).     . nitrofurantoin, macrocrystal-monohydrate, (MACROBID) 100 MG capsule Take by mouth.    . pantoprazole (PROTONIX) 40 MG tablet Take 40 mg by mouth 2 (two) times daily.    . pregabalin (LYRICA) 150 MG capsule Take 150 mg by mouth 2 (two) times daily.    . promethazine (PHENERGAN) 25 MG tablet Take 1 tablet (25 mg total) by mouth every 6 (six) hours as needed for nausea or vomiting. (Patient not taking: Reported on 05/16/2019) 30 tablet 0  . Solriamfetol HCl (SUNOSI PO) Take by mouth.    Marguarite Arbour. SUNOSI  150 MG TABS Take 1 tablet by mouth daily.    Marland Kitchen  triamcinolone cream (KENALOG) 0.1 % SMARTSIG:Sparingly Topical Twice Daily    . venlafaxine XR (EFFEXOR XR) 150 MG 24 hr capsule Take 1 capsule (150 mg total) by mouth daily with breakfast. 90 capsule 0   No current facility-administered medications for this visit.     Musculoskeletal: Strength & Muscle Tone: UTA Gait & Station: normal Patient leans: N/A  Psychiatric Specialty Exam: Review of Systems  Psychiatric/Behavioral: Positive for dysphoric mood and sleep disturbance. The patient is nervous/anxious.        Mania  All other systems reviewed and are negative.   There were no vitals taken for this visit.There is no height or weight on file to calculate BMI.  General Appearance: Casual  Eye Contact:  Fair  Speech:  Clear and Coherent  Volume:  Normal  Mood:  Anxious and mood swings  Affect:  Congruent  Thought Process:  Goal Directed and Descriptions of Associations: Intact  Orientation:  Full (Time, Place, and Person)  Thought Content: Paranoid Ideation and Rumination   Suicidal Thoughts:  No  Homicidal Thoughts:  No  Memory:  Immediate;   Fair Recent;   Fair Remote;   Fair  Judgement:  Fair  Insight:  Fair  Psychomotor Activity:  Normal  Concentration:  Concentration: Fair and Attention Span: Fair  Recall:  [employer] of Knowledge: Fair  Language: Fair  Akathisia:  No  Handed:  Right  AIMS (if indicated): UTA  Assets:  Communication Skills Desire for Improvement Housing Social Support  ADL's:  Intact  Cognition: WNL  Sleep:  Poor   Screenings: AIMS     [facility]  AIMS Total Score 0    AUDIT     [facility]  Alcohol Use Disorder Identification Test Final Score (AUDIT) 0       Assessment and Plan: BERLIN VIERECK is a 29 year old Caucasian female, applied for disability, lives in [place], has a  history of bipolar disorder, ADD, sinus tachycardia, hypersomnia, obstructive sleep apnea was evaluated by telemedicine today.  Patient is biologically predisposed given her history of trauma, family history and her own health issues.  Patient with psychosocial stressors of being single, current pandemic, relationship struggles and unemployment.  Patient continues to struggle with mood symptoms, anxiety symptoms and will benefit from medication readjustment.  Plan as noted below.  Plan Bipolar disorder current episode mixed-unstable Increase Latuda to 40 mg daily with supper. Continue CBT.  PTSD-unstable Reduce venlafaxine extended release 150 mg p.o. daily. Start BuSpar 15 mg p.o. twice daily. Start Klonopin 0.5 mg as needed for severe anxiety attacks only.  We will give her 10 pills.  Patient advised to limit use.  Provided education about benzodiazepine therapy.  Panic attacks/anxiety attacks-unstable Hydroxyzine 25 to 50 mg p.o. daily as needed for anxiety attacks Klonopin 0.5 mg as needed for breakthrough anxiety only. I have reviewed [place] controlled substance database. Continue CBT with therapist [facility]vone Neu with [facility].  Phone #[phone].  Sleep disorder-some progress Patient with history of obstructive sleep apnea on CPAP. She is also on the [facility] and currently follows up with sleep specialist. Discussed weight loss with patient since she continues to struggle with the sensory sleepiness, tiredness during the day.  She will benefit from referral to a nutritionist/weight loss clinic. She will have a discussion with primary  care provider.  I have reviewed medical records from Dr. Chapman Fitch dated 10/05/2019-patient with tachycardia, morbid obesity, OSA on CPAP-EKG today reveals sinus tachycardia rate 110 bpm.  Patient has no contraindication to Vyvanse.  Limited echo in 6 months to monitor LVEF and GLS.  Return in 6 months.'  Writer was able to contact with her  therapist-therapist to reach back to Clinical research associate with patient consent.  Follow-up in clinic in 4 weeks or sooner if needed.  I have spent atleast 30 minutes face to face by video with patient today. More than 50 % of the time was spent for preparing to see the patient ( e.g., review of test, records ), obtaining and to review and separately obtained history , ordering medications and test ,psychoeducation and supportive psychotherapy and care coordination,as well as documenting clinical information in electronic health record. This note was generated in part or whole with voice recognition software. Voice recognition is usually quite accurate but there are transcription errors that can and very often do occur. I apologize for any typographical errors that were not detected and corrected.     Jomarie Longs, MD 11/02/2019, 4:18 PM

## 2019-11-29 ENCOUNTER — Other Ambulatory Visit: Payer: Self-pay | Admitting: Psychiatry

## 2019-11-29 DIAGNOSIS — F410 Panic disorder [episodic paroxysmal anxiety] without agoraphobia: Secondary | ICD-10-CM

## 2019-11-29 DIAGNOSIS — F4310 Post-traumatic stress disorder, unspecified: Secondary | ICD-10-CM

## 2019-12-06 ENCOUNTER — Encounter: Payer: Self-pay | Admitting: Psychiatry

## 2019-12-06 ENCOUNTER — Telehealth (INDEPENDENT_AMBULATORY_CARE_PROVIDER_SITE_OTHER): Payer: Medicaid Other | Admitting: Psychiatry

## 2019-12-06 ENCOUNTER — Other Ambulatory Visit: Payer: Self-pay

## 2019-12-06 DIAGNOSIS — F4310 Post-traumatic stress disorder, unspecified: Secondary | ICD-10-CM

## 2019-12-06 DIAGNOSIS — F410 Panic disorder [episodic paroxysmal anxiety] without agoraphobia: Secondary | ICD-10-CM

## 2019-12-06 DIAGNOSIS — F3162 Bipolar disorder, current episode mixed, moderate: Secondary | ICD-10-CM | POA: Diagnosis not present

## 2019-12-06 DIAGNOSIS — F900 Attention-deficit hyperactivity disorder, predominantly inattentive type: Secondary | ICD-10-CM

## 2019-12-06 DIAGNOSIS — G479 Sleep disorder, unspecified: Secondary | ICD-10-CM

## 2019-12-06 MED ORDER — LISDEXAMFETAMINE DIMESYLATE 60 MG PO CAPS: 60.0000 mg | ORAL_CAPSULE | ORAL | 0 refills | Status: DC

## 2019-12-06 MED ORDER — LURASIDONE HCL 60 MG PO TABS: 60.0000 mg | ORAL_TABLET | Freq: Every day | ORAL | 0 refills | Status: DC

## 2019-12-06 MED ORDER — CLONAZEPAM 0.5 MG PO TABS: 0.5000 mg | ORAL_TABLET | Freq: Every day | ORAL | 0 refills | Status: DC | PRN

## 2019-12-06 NOTE — Progress Notes (Signed)
Virtual Visit via Video Note  I connected with Denise Conrad on 12/06/19 at  2:30 PM EST by a video enabled telemedicine application and verified that I am speaking with the correct person using two identifiers. Location Provider Location : [facility] Patient Location : Home  Participants: Patient , Provider   I discussed the limitations of evaluation and management by telemedicine and the availability of in person appointments. The patient expressed understanding and agreed to proceed.    I discussed the assessment and treatment plan with the patient. The patient was provided an opportunity to ask questions and all were answered. The patient agreed with the plan and demonstrated an understanding of the instructions.   The patient was advised to call back or seek an in-person evaluation if the symptoms worsen or if the condition fails to improve as anticipated.  BH MD OP Progress Note  12/06/2019 3:03 PM Denise Conrad  MRN:  [id number]  Chief Complaint:  Chief Complaint    Follow-up     HPI: Denise Conrad is a 29 year old Caucasian female, unemployed, has a history of bipolar disorder, PTSD, ADHD, panic attacks, sleep problems, sinus tachycardia was evaluated by telemedicine today.  Patient today reports a cousin recently committed suicide.  He had drug abuse problems which led to his death.  She reports she was not close to him however she is close to his brother and that makes it difficult for her.  Patient reports she has been struggling with depressive symptoms since the past few days.  She reports she sleeps a lot.  She was prescribed Sunosi by her sleep provider previously.  She however reports she could not get it back since she had to fill out some documents before getting it again.  She has not done it yet.  She reports even when she was taking the [facility] she would sleep 16 hours or so per day.  She reports she wakes up in the morning at around 9 AM to take all her  medications including Vyvanse and then goes back to bed and sleeps until 11 or 12.  She reports she wakes up after that and then goes to bed around 10 PM.  Patient reports she spends a lot of time in bed.  Patient has a history of obstructive sleep apnea.  She reports she had a sleep study done in the past.  She reports she is compliant with her CPAP.  She reports her sleep study also ruled out narcolepsy.  Patient denies any suicidality, homicidality or perceptual disturbances.  Patient reports she has been using her clonazepam more frequently a few times a week now since the death of her cousin.  She however reports she will limit use.  Patient continues to be in psychotherapy sessions and reports therapy sessions are beneficial.  She has a 14 year old son and she reports her parents help to take care of him.  Patient denies any other concerns today.  Visit Diagnosis:    ICD-10-CM   1. Bipolar 1 disorder, mixed, moderate (HCC)  F31.62 Lurasidone HCl 60 MG TABS  2. PTSD (post-traumatic stress disorder)  F43.10   3. Attention deficit hyperactivity disorder (ADHD), predominantly inattentive type  F90.0 lisdexamfetamine (VYVANSE) 60 MG capsule  4. Panic attacks  F41.0 clonazePAM (KLONOPIN) 0.5 MG tablet  5. Sleep disorder  G47.9     Past Psychiatric History: I have reviewed past psychiatric history from my progress note on 09/26/2019.  Past trials of lithium, Lamictal, Zyprexa, Cymbalta, Vyvanse,  Wellbutrin, Abilify, Prozac, Lexapro, Paxil, Zoloft  Past Medical History:  Past Medical History:  Diagnosis Date  . Acute renal failure (HCC) 11/22/2014  . Acute suppurative tonsillitis 11/20/2014  . Anxiety   . Bipolar 1 disorder (HCC)   . Depression   . Fibromyalgia   . 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 Sepsis (HCC) 11/20/2014    Past Surgical History:  Procedure Laterality Date  . IUD INSERTION  05/16/2019      . TONSILLECTOMY    . WISDOM TOOTH EXTRACTION      Family Psychiatric  History: I have reviewed family psychiatric history from my progress note on 09/26/2019  Family History:  Family History  Problem Relation Age of Onset  . Hashimoto's thyroiditis Mother   . Fibromyalgia Mother   . Hypertension Father   . Heart failure Father        hyptertensive cardiovascular disease  . Schizophrenia Father   . Suicidality Maternal Grandmother   . Bipolar disorder Paternal Grandmother     Social History: I have reviewed social history from my progress note on 09/26/2019 Social History   Socioeconomic History  . Marital status: Single    Spouse name: Not on file  . Number of children: Not on file  . Years of education: Not on file  . Highest education level: Not on file  Occupational History  . Not on file  Tobacco Use  . Smoking status: Never Smoker  . Smokeless tobacco: Never Used  Vaping Use  . Vaping Use: Never used  Substance and Sexual Activity  . Alcohol use: No  . Drug use: No  . Sexual activity: Yes    Partners: Male  Other Topics Concern  . Not on file  Social History Narrative  . Not on file   Social Determinants of Health   Financial Resource Strain:   . Difficulty of Paying Living Expenses: Not on file  Food Insecurity:   . Worried About Programme researcher, broadcasting/film/video in the Last Year: Not on file  . Ran Out of Food in the Last Year: Not on file  Transportation Needs:   . Lack of Transportation (Medical): Not on file  . Lack of Transportation (Non-Medical): Not on file  Physical Activity:   . Days of Exercise per Week: Not on file  . Minutes of Exercise per Session: Not on file  Stress:   . Feeling of Stress : Not on file  Social Connections:   . Frequency of Communication with Friends and Family: Not on file  . Frequency of Social Gatherings with Friends and Family: Not on file  . Attends Religious Services: Not on file  . Active Member of Clubs or Organizations: Not on file  . Attends Banker Meetings: Not on file  .  Marital Status: Not on file    Allergies:  Allergies  Allergen Reactions  . Saphris [Asenapine] Shortness Of Breath and Other (See Comments)    Migraines, also  . Adhesive [Tape] Itching  . Topamax [Topiramate] Other (See Comments)    Gave her migraines that made her pass out  . Zofran [Ondansetron] Hives  . Latex Rash  . Sulfa Antibiotics Rash    Metabolic Disorder Labs: Lab Results  Component Value Date   HGBA1C 5.6 08/21/2015   MPG 114 08/21/2015   Lab Results  Component Value Date   PROLACTIN 35.1 (H) 08/21/2015   PROLACTIN 68.9 (H) 08/20/2015   Lab Results  Component Value Date  CHOL 208 (H) 08/20/2015   TRIG 212 (H) 08/20/2015   HDL 58 08/20/2015   CHOLHDL 3.6 08/20/2015   VLDL 42 (H) 08/20/2015   LDLCALC 108 (H) 08/20/2015   Lab Results  Component Value Date   TSH 3.976 08/20/2015    Therapeutic Level Labs: No results found for: LITHIUM No results found for: VALPROATE No components found for:  CBMZ  Current Medications: Current Outpatient Medications  Medication Sig Dispense Refill  . acetaminophen (TYLENOL) 500 MG tablet Take 1,000 mg by mouth every 6 (six) hours as needed (for headaches).    Marland Kitchen amoxicillin (AMOXIL) 875 MG tablet Take 875 mg by mouth 2 (two) times daily.    . baclofen (LIORESAL) 10 MG tablet TAKE 1 TABLET BY MOUTH THREE TIMES A DAY AS NEEDED    . BOTOX 200 units SOLR     . busPIRone (BUSPAR) 15 MG tablet TAKE 1 TABLET BY MOUTH 2 TIMES DAILY. 180 tablet 1  . calcium carbonate (OS-CAL) 1250 (500 Ca) MG chewable tablet Chew by mouth. (Patient not taking: Reported on 09/26/2019)    . calcium carbonate (TUMS - DOSED IN MG ELEMENTAL CALCIUM) 500 MG chewable tablet Chew 1-2 tablets by mouth as needed for indigestion or heartburn. (Patient not taking: Reported on 09/26/2019)    . cetirizine (ZYRTEC) 10 MG tablet Take 10 mg by mouth at bedtime.  12  . clonazePAM (KLONOPIN) 0.5 MG tablet Take 1 tablet (0.5 mg total) by mouth daily as needed for  anxiety. Start taking once a day as needed for severe anxiety attacks only 10 tablet 0  . Cyanocobalamin (B-12) 1000 MCG CAPS Take 1,000 mcg by mouth daily.     . fesoterodine (TOVIAZ) 8 MG TB24 tablet Take 8 mg by mouth at bedtime.    . hydrOXYzine (ATARAX/VISTARIL) 25 MG tablet TAKE 1-2 TABLETS (25-50 MG TOTAL) BY MOUTH DAILY AS NEEDED. FOR SEVERE PANIC ATTACKS ONLY 180 tablet 1  . ibuprofen (ADVIL,MOTRIN) 200 MG tablet Take 800 mg by mouth every 6 (six) hours as needed (for headaches).    Marland Kitchen levonorgestrel (LILETTA, 52 MG,) 19.5 MCG/DAY IUD IUD 1 each by Intrauterine route once.    . lisdexamfetamine (VYVANSE) 60 MG capsule Take 1 capsule (60 mg total) by mouth every morning. Take daily at 8:30 AM 30 capsule 0  . [START ON 12/16/2019] lisdexamfetamine (VYVANSE) 60 MG capsule Take 1 capsule (60 mg total) by mouth every morning. 30 capsule 0  . Lurasidone HCl 60 MG TABS Take 1 tablet (60 mg total) by mouth daily with supper. 90 tablet 0  . Melatonin 10 MG TABS Take 20-40 mg by mouth at bedtime as needed (for sleep).     . nitrofurantoin, macrocrystal-monohydrate, (MACROBID) 100 MG capsule Take by mouth.    . pantoprazole (PROTONIX) 40 MG tablet Take 40 mg by mouth 2 (two) times daily.    . pregabalin (LYRICA) 150 MG capsule Take 150 mg by mouth 2 (two) times daily.    . promethazine (PHENERGAN) 25 MG tablet Take 1 tablet (25 mg total) by mouth every 6 (six) hours as needed for nausea or vomiting. (Patient not taking: Reported on 05/16/2019) 30 tablet 0  . Solriamfetol HCl (SUNOSI PO) Take by mouth.    . SUNOSI 150 MG TABS Take 1 tablet by mouth daily.    Marland Kitchen triamcinolone cream (KENALOG) 0.1 % SMARTSIG:Sparingly Topical Twice Daily    . venlafaxine XR (EFFEXOR XR) 150 MG 24 hr capsule Take 1 capsule (150 mg total) by  mouth daily with breakfast. 90 capsule 0   No current facility-administered medications for this visit.     Musculoskeletal: Strength & Muscle Tone: UTA Gait & Station:  UTA Patient leans: N/A  Psychiatric Specialty Exam: Review of Systems  Constitutional: Positive for fatigue.  Psychiatric/Behavioral: Positive for dysphoric mood and sleep disturbance. The patient is nervous/anxious.   All other systems reviewed and are negative.   There were no vitals taken for this visit.There is no height or weight on file to calculate BMI.  General Appearance: Casual  Eye Contact:  Fair  Speech:  Clear and Coherent  Volume:  Normal  Mood:  Anxious, Depressed and Dysphoric  Affect:  Congruent  Thought Process:  Goal Directed and Descriptions of Associations: Intact  Orientation:  Full (Time, Place, and Person)  Thought Content: Logical   Suicidal Thoughts:  No  Homicidal Thoughts:  No  Memory:  Immediate;   Fair Recent;   Fair Remote;   Fair  Judgement:  Good  Insight:  Fair  Psychomotor Activity:  Normal  Concentration:  Concentration: Fair and Attention Span: Fair  Recall:  [employer] of Knowledge: Fair  Language: Fair  Akathisia:  No  Handed:  Right  AIMS (if indicated): UTA  Assets:  Communication Skills Desire for Improvement Housing Social Support  ADL's:  Intact  Cognition: WNL  Sleep:  Excessive   Screenings: AIMS     [facility]  AIMS Total Score 0    AUDIT     [facility]  Alcohol Use Disorder Identification Test Final Score (AUDIT) 0       Assessment and Plan: Denise Conrad is a 29 year old Caucasian female, lives in [place], has a history of bipolar disorder, ADD, sinus tachycardia, hypersomnia, obstructive sleep apnea was evaluated by telemedicine today.  Patient is biologically predisposed given her history of trauma, family history and her own health issues.  Patient with psychosocial stressors of being single, current pandemic, relationship struggles, unemployed, recent  death in family.  Patient also continues to struggle with sleep disorder, excessive sleepiness during the day, discussed plan as noted below.  Plan Bipolar disorder-unstable Increase Latuda to 60 mg p.o. daily with supper Continue CBT with therapist Yvone Neu.  PTSD-some progress Venlafaxine at reduced dosage of 150 mg p.o. daily BuSpar 15 mg p.o. twice daily Klonopin 0.5 mg as needed for severe anxiety attacks only.  Patient advised to limit use. Reviewed [place] controlled substance database.  Panic attacks/anxiety attacks-improving Hydroxyzine 25 to 50 mg p.o. daily as needed for severe anxiety attacks Klonopin 0.5 mg as needed for breakthrough anxiety only. Continue CBT with therapist with [facility]-phone #[phone].  Sleep disorder-unstable Patient with obstructive sleep apnea currently on CPAP. She used to be on the [facility] however currently is not on the same. She does struggle with obesity and will benefit from weight loss.  Advised patient to contact primary care provider for referral to weight loss clinic/nutritionist. Discussed with patient to avoid taking Vyvanse later on during the day since it will affect her sleep at night and can affect her sleep wake cycle the next day. Patient to be referred to sleep specialist for consultation.  She agrees with plan.  Follow-up in clinic in 3 to 4 weeks or sooner if needed.  I have spent atleast 30 minutes face to face by video with patient today. More than 50 % of  the time was spent for preparing to see the patient ( e.g., review of test, records ), ordering medications and test ,psychoeducation and supportive psychotherapy and care coordination,as well as documenting clinical information in electronic health record. This note was generated in part or whole with voice recognition software. Voice recognition is usually quite accurate but there are transcription errors that can and very often do occur. I apologize for any  typographical errors that were not detected and corrected.        Jomarie LongsSaramma Alysen Smylie, MD 12/07/2019, 8:12 AM

## 2019-12-18 ENCOUNTER — Ambulatory Visit (INDEPENDENT_AMBULATORY_CARE_PROVIDER_SITE_OTHER): Payer: Medicaid Other | Admitting: Internal Medicine

## 2019-12-18 VITALS — BP 138/100 | HR 99 | Temp 97.5°F | Resp 16 | Ht 66.0 in | Wt 353.0 lb

## 2019-12-18 DIAGNOSIS — K219 Gastro-esophageal reflux disease without esophagitis: Secondary | ICD-10-CM

## 2019-12-18 DIAGNOSIS — G4711 Idiopathic hypersomnia with long sleep time: Secondary | ICD-10-CM | POA: Diagnosis not present

## 2019-12-18 DIAGNOSIS — Z7189 Other specified counseling: Secondary | ICD-10-CM | POA: Diagnosis not present

## 2019-12-18 DIAGNOSIS — G4733 Obstructive sleep apnea (adult) (pediatric): Secondary | ICD-10-CM | POA: Diagnosis not present

## 2019-12-18 NOTE — Progress Notes (Signed)
Sleep Medicine   Office Visit  Patient Name: Denise Conrad DOB: 1990-09-13 MRN [id number]    Chief Complaint: sleep apnea  Brief History:  Denise Conrad presents with a 4-5 year history of sleep apnea. She is currently on a CPAP machine. She was referred because she is still very sleepy despite using CPAP.  She goes to bed between 11-midnight.  She falls asleep without difficulty and sleeps well. She does not wake up until 1-2 p.m. She has been sleeping much longer over the past few years.  She still does not feel rested. She denies being told that she is snoring and her current sleep specialist has check her download and told her that her apnea is controlled. She is very sleepy during the day. She sleeps for long periods during the day. She takes Vyvanse which keeps her from falling asleep but she still feels sleepy. Her psychiatrist believes that the sleepiness is beyond what is explained by her psych history. The  Patient believes it is her depression.  Patient has been told that she has restless leg syndrome but it has not been treated.  The patient  relates no unusual behavior during the night.  The Epworth Sleepiness Score is 21 out of 24 despite the use of Vyvanse.   ROS  General: (-) fever, (-) chills, (-) night sweat Nose and Sinuses: (-) nasal stuffiness or itchiness, (-) postnasal drip, (-) nosebleeds, (-) sinus trouble. Mouth and Throat: (-) sore throat, (-) hoarseness. Neck: (-) swollen glands, (-) enlarged thyroid, (-) neck pain. Respiratory: - cough, - shortness of breath, - wheezing. Neurologic: - numbness, - tingling. Psychiatric: - anxiety, - depression Sleep behavior: -sleep paralysis -hypnogogic hallucinations -dream enactment      -vivid dreams -cataplexy -night terrors -sleep walking   Current Medication: Outpatient Encounter Medications as of 12/18/2019  Medication Sig  . acetaminophen (TYLENOL) 500 MG tablet Take 1,000 mg by mouth every 6 (six) hours as needed  (for headaches).  Marland Kitchen amoxicillin (AMOXIL) 875 MG tablet Take 875 mg by mouth 2 (two) times daily.  . baclofen (LIORESAL) 10 MG tablet TAKE 1 TABLET BY MOUTH THREE TIMES A DAY AS NEEDED  . BOTOX 200 units SOLR   . busPIRone (BUSPAR) 15 MG tablet TAKE 1 TABLET BY MOUTH 2 TIMES DAILY.  . calcium carbonate (OS-CAL) 1250 (500 Ca) MG chewable tablet Chew by mouth. (Patient not taking: Reported on 09/26/2019)  . calcium carbonate (TUMS - DOSED IN MG ELEMENTAL CALCIUM) 500 MG chewable tablet Chew 1-2 tablets by mouth as needed for indigestion or heartburn. (Patient not taking: Reported on 09/26/2019)  . cetirizine (ZYRTEC) 10 MG tablet Take 10 mg by mouth at bedtime.  . clonazePAM (KLONOPIN) 0.5 MG tablet Take 1 tablet (0.5 mg total) by mouth daily as needed for anxiety. Start taking once a day as needed for severe anxiety attacks only  . Cyanocobalamin (B-12) 1000 MCG CAPS Take 1,000 mcg by mouth daily.   . fesoterodine (TOVIAZ) 8 MG TB24 tablet Take 8 mg by mouth at bedtime.  . hydrOXYzine (ATARAX/VISTARIL) 25 MG tablet TAKE 1-2 TABLETS (25-50 MG TOTAL) BY MOUTH DAILY AS NEEDED. FOR SEVERE PANIC ATTACKS ONLY  . ibuprofen (ADVIL,MOTRIN) 200 MG tablet Take 800 mg by mouth every 6 (six) hours as needed (for headaches).  Marland Kitchen levonorgestrel (LILETTA, 52 MG,) 19.5 MCG/DAY IUD IUD 1 each by Intrauterine route once.  . lisdexamfetamine (VYVANSE) 60 MG capsule Take 1 capsule (60 mg total) by mouth every morning. Take daily at 8:30  AM  . lisdexamfetamine (VYVANSE) 60 MG capsule Take 1 capsule (60 mg total) by mouth every morning.  . Lurasidone HCl 60 MG TABS Take 1 tablet (60 mg total) by mouth daily with supper.  . Melatonin 10 MG TABS Take 20-40 mg by mouth at bedtime as needed (for sleep).   . nitrofurantoin, macrocrystal-monohydrate, (MACROBID) 100 MG capsule Take by mouth.  . pantoprazole (PROTONIX) 40 MG tablet Take 40 mg by mouth 2 (two) times daily.  . pregabalin (LYRICA) 150 MG capsule Take 150 mg by mouth 2  (two) times daily.  . promethazine (PHENERGAN) 25 MG tablet Take 1 tablet (25 mg total) by mouth every 6 (six) hours as needed for nausea or vomiting. (Patient not taking: Reported on 05/16/2019)  . Solriamfetol HCl (SUNOSI PO) Take by mouth.  . SUNOSI 150 MG TABS Take 1 tablet by mouth daily.  Marland Kitchen triamcinolone cream (KENALOG) 0.1 % SMARTSIG:Sparingly Topical Twice Daily  . venlafaxine XR (EFFEXOR XR) 150 MG 24 hr capsule Take 1 capsule (150 mg total) by mouth daily with breakfast.   No facility-administered encounter medications on file as of 12/18/2019.    Surgical History: Past Surgical History:  Procedure Laterality Date  . IUD INSERTION  05/16/2019      . TONSILLECTOMY    . WISDOM TOOTH EXTRACTION      Medical History: Past Medical History:  Diagnosis Date  . Acute renal failure (HCC) 11/22/2014  . Acute suppurative tonsillitis 11/20/2014  . Anxiety   . Bipolar 1 disorder (HCC)   . Depression   . Fibromyalgia   . 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 Sepsis (HCC) 11/20/2014    Family History: Non contributory to the present illness  Social History: Social History   Socioeconomic History  . Marital status: Single    Spouse name: Not on file  . Number of children: Not on file  . Years of education: Not on file  . Highest education level: Not on file  Occupational History  . Not on file  Tobacco Use  . Smoking status: Never Smoker  . Smokeless tobacco: Never Used  Vaping Use  . Vaping Use: Never used  Substance and Sexual Activity  . Alcohol use: No  . Drug use: No  . Sexual activity: Yes    Partners: Male  Other Topics Concern  . Not on file  Social History Narrative  . Not on file   Social Determinants of Health   Financial Resource Strain:   . Difficulty of Paying Living Expenses: Not on file  Food Insecurity:   . Worried About Programme researcher, broadcasting/film/video in the Last Year: Not on file  . Ran Out of Food in the Last Year: Not on file  Transportation Needs:    . Lack of Transportation (Medical): Not on file  . Lack of Transportation (Non-Medical): Not on file  Physical Activity:   . Days of Exercise per Week: Not on file  . Minutes of Exercise per Session: Not on file  Stress:   . Feeling of Stress : Not on file  Social Connections:   . Frequency of Communication with Friends and Family: Not on file  . Frequency of Social Gatherings with Friends and Family: Not on file  . Attends Religious Services: Not on file  . Active Member of Clubs or Organizations: Not on file  . Attends Banker Meetings: Not on file  . Marital Status: Not on file  Intimate Partner Violence:   .  Fear of Current or Ex-Partner: Not on file  . Emotionally Abused: Not on file  . Physically Abused: Not on file  . Sexually Abused: Not on file    Vital Signs: There were no vitals taken for this visit.  Examination: General Appearance: The patient is well-developed, well-nourished, and in no distress. Neck Circumference:  Skin: Gross inspection of skin unremarkable. Head: normocephalic, no gross deformities. Eyes: no gross deformities noted. ENT: ears appear grossly normal Neurologic: Alert and oriented. No involuntary movements.    EPWORTH SLEEPINESS SCALE:  Scale:  (0)= no chance of dozing; (1)= slight chance of dozing; (2)= moderate chance of dozing; (3)= high chance of dozing  Chance  Situtation    Sitting and reading: 3   Watching TV: 3    Sitting Inactive in public: 2    As a passenger in car: 2      Lying down to rest: 3    Sitting and talking: 2    Sitting quielty after lunch: 3    In a car, stopped in traffic: 2   TOTAL SCORE:   21 out of 24    SLEEP STUDIES:  1. No sleep studies on file   LABS: No results found for this or any previous visit (from the past 2160 hour(s)).  Radiology: No results found.  No results found.  No results found.    Assessment and Plan: Patient Active Problem List   Diagnosis  Date Noted  . High risk medication use 11/02/2019  . Bipolar 1 disorder, mixed, moderate (HCC) 09/26/2019  . PTSD (post-traumatic stress disorder) 09/26/2019  . Attention deficit hyperactivity disorder (ADHD), predominantly inattentive type 09/26/2019  . Sleep disorder 09/26/2019  . Panic attacks 09/26/2019  . Hepatic steatosis 05/26/2019  . Vitamin D deficiency 12/05/2018  . Gastro-esophageal reflux disease without esophagitis 02/18/2018  . IFG (impaired fasting glucose) 05/30/2017  . Acne vulgaris 05/28/2017  . Chronic nasopharyngitis 03/05/2017  . Chlamydia infection 07/31/2016  . Pap smear of cervix shows low risk HPV present 07/31/2016  . Obstructive sleep apnea syndrome, moderate 04/28/2016  . Iron deficiency 04/20/2016  . Chronic bilateral low back pain without sciatica 02/21/2016  . Family history of Hashimoto thyroiditis 02/21/2016  . Morbid obesity with BMI of 50.0-59.9, adult (HCC) 02/21/2016  . Scoliosis 02/21/2016  . Bipolar 1 disorder, depressed, severe (HCC) 08/19/2015  . Anxiety 11/20/2014     PLAN OSA:   Patient has documented sleep apnea and according to her her recent CPAP downloads show good compliance and control of her apnea. (compliance 95% and AHI 0.5)  Despite this and 14 hours of sleep she is still very sleepy during the day. She does have a report of restless leg syndrome. She notes that she had an MSLT within the past year and does not have narcolepsy. She was diagnosed with idiopathic hypersomnia. She was only tried on Sunosi which did not help. We will need to have copies of all her studies before discussing treatment.   1. OSA- continue excellent compliance with CPAP. 2. 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 CPAP couseling-Discussed importance of adequate CPAP use as well as proper care and cleaning techniques of machine and all supplies.  4. GERD - controlled on medication  followed by PCP 5. BIPOLARFollowed by Psychiatrist: Dr. Jomarie Longs.   Previous PSG studies:  Dr. Gery Pray, Pulmonologist with [facility] in [place], [place] [phone] previous PSG  General Counseling: I have discussed the findings of the evaluation and examination with Denise Conrad.  I have also discussed any further diagnostic evaluation thatmay be needed or ordered today. Denise Conrad verbalizes understanding of the findings of todays visit. We also reviewed her medications today and discussed drug interactions and side effects including but not limited excessive drowsiness and altered mental states. We also discussed that there is always a risk not just to her but also people around her. she has been encouraged to call the office with any questions or concerns that should arise related to todays visit.  No orders of the defined types were placed in this encounter.   This patient was seen by Layla BarterElizabeth Van Dyke, AGNP-C in collaboration with Dr. Freda MunroSaadat Inman Fettig as a part of collaborative care agreement.    I have personally obtained a history, evaluated the patient, evaluated pertinent data, formulated the assessment and plan and placed orders.   Valentino HueKathe G. Sol BlazingHenke, PhD, FAASM  Diplomate, American Board of Sleep Medicine    Yevonne PaxSaadat A Ida Milbrath, MD [facility]CC[facility] Diplomate ABMS Pulmonary and Critical Care Medicine Sleep medicine

## 2019-12-18 NOTE — Patient Instructions (Signed)
Sleep Studies A sleep study (polysomnogram) is a series of tests done while you are sleeping. A sleep study records your brain waves, heart rate, breathing rate, oxygen level, and eye and leg movements. A sleep study helps your health care provider:  See how well you sleep.  Diagnose a sleep disorder.  Determine how severe your sleep disorder is.  Create a plan to treat your sleep disorder. Your health care provider may recommend a sleep study if you:  Feel sleepy on most days.  Snore loudly while sleeping.  Have unusual behaviors while you sleep, such as walking.  Have brief periods in which you stop breathing during sleep (sleepapnea).  Fall asleep suddenly during the day (narcolepsy).  Have trouble falling asleep or staying asleep (insomnia).  Feel like you need to move your legs when trying to fall asleep (restless legs syndrome).  Move your legs by flexing and extending them regularly while asleep (periodic limb movement disorder).  Act out your dreams while you sleep (sleep behavior disorder).  Feel like you cannot move when you first wake up (sleep paralysis). What tests are part of a sleep study? Most sleep studies record the following during sleep:  Brain activity.  Eye movements.  Heart rate and rhythm.  Breathing rate and rhythm.  Blood-oxygen level.  Blood pressure.  Chest and belly movement as you breathe.  Arm and leg movements.  Snoring or other noises.  Body position. Where are sleep studies done? Sleep studies are done at sleep centers. A sleep center may be inside a hospital, office, or clinic. The room where you have the study may look like a hospital room or a hotel room. The health care providers doing the study may come in and out of the room during the study. Most of the time, they will be in another room monitoring your test as you sleep. How are sleep studies done? Most sleep studies are done during a normal period of time for a full  night of sleep. You will arrive at the study center in the evening and go home in the morning. Before the test  Bring your pajamas and toothbrush with you to the sleep study.  Do not have caffeine on the day of your sleep study.  Do not drink alcohol on the day of your sleep study.  Your health care provider will let you know if you should stop taking any of your regular medicines before the test. During the test      Round, sticky patches with sensors attached to recording wires (electrodes) are placed on your scalp, face, chest, and limbs.  Wires from all the electrodes and sensors run from your bed to a computer. The wires can be taken off and put back on if you need to get out of bed to go to the bathroom.  A sensor is placed over your nose to measure airflow.  A finger clip is put on your finger or ear to measure your blood oxygen level (pulse oximetry).  A belt is placed around your belly and a belt is placed around your chest to measure breathing movements.  If you have signs of the sleep disorder called sleep apnea during your test, you may get a treatment mask to wear for the second half of the night. ? The mask provides positive airway pressure (PAP) to help you breathe better during sleep. This may greatly improve your sleep apnea. ? You will then have all tests done again with the mask   in place to see if your measurements and recordings change. After the test  A medical doctor who specializes in sleep will evaluate the results of your sleep study and share them with you and your primary health care provider.  Based on your results, your medical history, and a physical exam, you may be diagnosed with a sleep disorder, such as: ? Sleep apnea. ? Restless legs syndrome. ? Sleep-related behavior disorder. ? Sleep-related movement disorders. ? Sleep-related seizure disorders.  Your health care team will help determine your treatment options based on your diagnosis. This  may include: ? Improving your sleep habits (sleep hygiene). ? Wearing a continuous positive airway pressure (CPAP) or bi-level positive airway pressure (BPAP) mask. ? Wearing an oral device at night to improve breathing and reduce snoring. ? Taking medicines. Follow these instructions at home:  Take over-the-counter and prescription medicines only as told by your health care provider.  If you are instructed to use a CPAP or BPAP mask, make sure you use it nightly as directed.  Make any lifestyle changes that your health care provider recommends.  If you were given a device to open your airway while you sleep, use it only as told by your health care provider.  Do not use any tobacco products, such as cigarettes, chewing tobacco, and e-cigarettes. If you need help quitting, ask your health care provider.  Keep all follow-up visits as told by your health care provider. This is important. Summary  A sleep study (polysomnogram) is a series of tests done while you are sleeping. It shows how well you sleep.  Most sleep studies are done over one full night of sleep. You will arrive at the study center in the evening and go home in the morning.  If you have signs of the sleep disorder called sleep apnea during your test, you may get a treatment mask to wear for the second half of the night.  A medical doctor who specializes in sleep will evaluate the results of your sleep study and share them with your primary health care provider. This information is not intended to replace advice given to you by your health care provider. Make sure you discuss any questions you have with your health care provider. Document Revised: 06/29/2018 Document Reviewed: 02/09/2017 [employer] Patient Education  2020 [employer]  

## 2019-12-29 ENCOUNTER — Other Ambulatory Visit: Payer: Self-pay | Admitting: Psychiatry

## 2019-12-29 DIAGNOSIS — F410 Panic disorder [episodic paroxysmal anxiety] without agoraphobia: Secondary | ICD-10-CM

## 2020-01-08 ENCOUNTER — Telehealth: Payer: Medicaid Other | Admitting: Psychiatry

## 2020-01-15 ENCOUNTER — Other Ambulatory Visit: Payer: Self-pay

## 2020-01-15 ENCOUNTER — Encounter: Payer: Self-pay | Admitting: Radiology

## 2020-01-15 ENCOUNTER — Telehealth (INDEPENDENT_AMBULATORY_CARE_PROVIDER_SITE_OTHER): Payer: Medicaid Other | Admitting: Psychiatry

## 2020-01-15 DIAGNOSIS — F900 Attention-deficit hyperactivity disorder, predominantly inattentive type: Secondary | ICD-10-CM

## 2020-01-15 DIAGNOSIS — F314 Bipolar disorder, current episode depressed, severe, without psychotic features: Secondary | ICD-10-CM

## 2020-01-15 DIAGNOSIS — F410 Panic disorder [episodic paroxysmal anxiety] without agoraphobia: Secondary | ICD-10-CM | POA: Diagnosis not present

## 2020-01-15 DIAGNOSIS — F4310 Post-traumatic stress disorder, unspecified: Secondary | ICD-10-CM

## 2020-01-15 DIAGNOSIS — G479 Sleep disorder, unspecified: Secondary | ICD-10-CM

## 2020-01-15 MED ORDER — CLONAZEPAM 0.5 MG PO TABS
0.5000 mg | ORAL_TABLET | Freq: Every day | ORAL | 0 refills | Status: DC | PRN
Start: 2020-01-28 — End: 2020-02-21

## 2020-01-15 MED ORDER — LURASIDONE HCL 60 MG PO TABS: 60.0000 mg | ORAL_TABLET | Freq: Every day | ORAL | 1 refills | Status: DC

## 2020-01-15 MED ORDER — LURASIDONE HCL 40 MG PO TABS: 40.0000 mg | ORAL_TABLET | Freq: Every day | ORAL | 1 refills | Status: DC

## 2020-01-15 MED ORDER — VENLAFAXINE HCL ER 150 MG PO CP24: 150.0000 mg | ORAL_CAPSULE | Freq: Every day | ORAL | 1 refills | Status: DC

## 2020-01-15 MED ORDER — BUSPIRONE HCL 15 MG PO TABS: 15.0000 mg | ORAL_TABLET | Freq: Two times a day (BID) | ORAL | 1 refills | Status: DC

## 2020-01-15 MED ORDER — LISDEXAMFETAMINE DIMESYLATE 60 MG PO CAPS: 60.0000 mg | ORAL_CAPSULE | ORAL | 0 refills | Status: DC

## 2020-01-15 NOTE — Progress Notes (Signed)
Virtual Visit via Video Note  I connected with Denise Conrad on 01/15/20 at  3:00 PM EST by a video enabled telemedicine application and verified that I am speaking with the correct person using two identifiers.  Location Provider Location : [facility] Patient Location [facility]  Participants: Patient , Provider   I discussed the limitations of evaluation and management by telemedicine and the availability of in person appointments. The patient expressed understanding and agreed to proceed.   I discussed the assessment and treatment plan with the patient. The patient was provided an opportunity to ask questions and all were answered. The patient agreed with the plan and demonstrated an understanding of the instructions.   The patient was advised to call back or seek an in-person evaluation if the symptoms worsen or if the condition fails to improve as anticipated.   BH MD OP Progress Note  01/15/2020 3:29 PM Denise Conrad  MRN:  [id number]  Chief Complaint:  Chief Complaint    Follow-up     HPI: Denise Conrad is a 29 year old Caucasian female, unemployed, has applied for disability, has a history of bipolar disorder, PTSD, ADHD, panic attacks, sleep problems, sinus tachycardia was evaluated by telemedicine today.  Patient today reports she has not had any manic episodes recently, the Latuda seems to be helpful with that.  She however reports she continues to struggle with depressive symptoms.  She reports she often feels sad and has excessive sleepiness during the day.  She had her sleep specialist consultation and they have advised her to request all medical records from her previous provider.  I have reviewed [facility]-Dr. Delsa BernKahn-dated 12/18/2019-' Patient has documented sleep apnea and according to her recent CPAP download showed good compliance and control of her apnea.  Despite these and 48 hours of sleep she is still  very sleepy during the day.  She does have a report of restless leg syndrome.  She notes that she had an MSLT within the past year and does not have narcolepsy.  She was diagnosed with idiopathic hypersomnia.  She was tried on Sunosi which did not help.'  Patient today denies any suicidality, homicidality or perceptual disturbances.  Patient continues to have panic attacks on and off and the days that she has excessive anxiety she makes use of Klonopin.  She however has been limiting use.  Patient with ADHD symptoms, compliant on Vyvanse which helps.  Patient denies any other concerns today.  Visit Diagnosis:    ICD-10-CM   1. Bipolar 1 disorder, depressed, severe (HCC)  F31.4 Lurasidone HCl 60 MG TABS    lurasidone (LATUDA) 40 MG TABS tablet  2. PTSD (post-traumatic stress disorder)  F43.10 venlafaxine XR (EFFEXOR XR) 150 MG 24 hr capsule    busPIRone (BUSPAR) 15 MG tablet  3. Attention deficit hyperactivity disorder (ADHD), predominantly inattentive type  F90.0 lisdexamfetamine (VYVANSE) 60 MG capsule  4. Panic attacks  F41.0 busPIRone (BUSPAR) 15 MG tablet    clonazePAM (KLONOPIN) 0.5 MG tablet  5. Sleep disorder  G47.9     Past Psychiatric History: I have reviewed past psychiatric history from my progress note on 09/26/2019.  Past trials of lithium, Lamictal, Zyprexa, Cymbalta, Vyvanse, Wellbutrin, Abilify, Prozac, Lexapro, Paxil, Zoloft  Past Medical History:  Past Medical History:  Diagnosis Date  . Acute renal failure (HCC) 11/22/2014  . Acute suppurative tonsillitis 11/20/2014  . Anxiety   . Bipolar 1 disorder (HCC)   . Depression   .  Fibromyalgia   . 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 Sepsis (HCC) 11/20/2014    Past Surgical History:  Procedure Laterality Date  . IUD INSERTION  05/16/2019      . TEMPOROMANDIBULAR JOINT ARTHROPLASTY  2015  . TONSILLECTOMY    . WISDOM TOOTH EXTRACTION      Family Psychiatric History: I have reviewed family psychiatric history from  my progress note on 09/26/2019 reviewed social history  Family History:  Family History  Problem Relation Age of Onset  . Hashimoto's thyroiditis Mother   . Fibromyalgia Mother   . Hypertension Father   . Heart failure Father        hyptertensive cardiovascular disease  . Schizophrenia Father   . Suicidality Maternal Grandmother   . Bipolar disorder Paternal Grandmother     Social History: Reviewed social history from my progress note on 09/26/2019 Social History   Socioeconomic History  . Marital status: Single    Spouse name: Not on file  . Number of children: Not on file  . Years of education: Not on file  . Highest education level: Not on file  Occupational History  . Not on file  Tobacco Use  . Smoking status: Never Smoker  . Smokeless tobacco: Never Used  Vaping Use  . Vaping Use: Never used  Substance and Sexual Activity  . Alcohol use: No  . Drug use: No  . Sexual activity: Yes    Partners: Male  Other Topics Concern  . Not on file  Social History Narrative  . Not on file   Social Determinants of Health   Financial Resource Strain: Not on file  Food Insecurity: Not on file  Transportation Needs: Not on file  Physical Activity: Not on file  Stress: Not on file  Social Connections: Not on file    Allergies:  Allergies  Allergen Reactions  . Saphris [Asenapine] Shortness Of Breath and Other (See Comments)    Migraines, also  . Adhesive [Tape] Itching  . Topamax [Topiramate] Other (See Comments)    Gave her migraines that made her pass out  . Zofran [Ondansetron] Hives  . Latex Rash  . Sulfa Antibiotics Rash    Metabolic Disorder Labs: Lab Results  Component Value Date   HGBA1C 5.6 08/21/2015   MPG 114 08/21/2015   Lab Results  Component Value Date   PROLACTIN 35.1 (H) 08/21/2015   PROLACTIN 68.9 (H) 08/20/2015   Lab Results  Component Value Date   CHOL 208 (H) 08/20/2015   TRIG 212 (H) 08/20/2015   HDL 58 08/20/2015   CHOLHDL 3.6  08/20/2015   VLDL 42 (H) 08/20/2015   LDLCALC 108 (H) 08/20/2015   Lab Results  Component Value Date   TSH 3.976 08/20/2015    Therapeutic Level Labs: No results found for: LITHIUM No results found for: VALPROATE No components found for:  CBMZ  Current Medications: Current Outpatient Medications  Medication Sig Dispense Refill  . amoxicillin (AMOXIL) 875 MG tablet Take 875 mg by mouth 2 (two) times daily.    . baclofen (LIORESAL) 10 MG tablet TAKE 1 TABLET BY MOUTH THREE TIMES A DAY AS NEEDED    . BOTOX 200 units SOLR     . busPIRone (BUSPAR) 15 MG tablet Take 1 tablet (15 mg total) by mouth 2 (two) times daily. 60 tablet 1  . calcium carbonate (OS-CAL) 1250 (500 Ca) MG chewable tablet Chew by mouth. (Patient not taking: Reported on 09/26/2019)    .  calcium carbonate (TUMS - DOSED IN MG ELEMENTAL CALCIUM) 500 MG chewable tablet Chew 1-2 tablets by mouth as needed for indigestion or heartburn. (Patient not taking: Reported on 09/26/2019)    . cetirizine (ZYRTEC) 10 MG tablet Take 10 mg by mouth at bedtime.  12  . [START ON 01/28/2020] clonazePAM (KLONOPIN) 0.5 MG tablet Take 1 tablet (0.5 mg total) by mouth daily as needed for anxiety. Start taking once a day as needed for severe anxiety attacks only 10 tablet 0  . Cyanocobalamin (B-12) 1000 MCG CAPS Take 1,000 mcg by mouth daily.     . fesoterodine (TOVIAZ) 8 MG TB24 tablet Take 8 mg by mouth at bedtime.    . hydrOXYzine (ATARAX/VISTARIL) 25 MG tablet TAKE 1-2 TABLETS (25-50 MG TOTAL) BY MOUTH DAILY AS NEEDED. FOR SEVERE PANIC ATTACKS ONLY 180 tablet 1  . ibuprofen (ADVIL,MOTRIN) 200 MG tablet Take 800 mg by mouth every 6 (six) hours as needed (for headaches).    Marland Kitchen levonorgestrel (LILETTA, 52 MG,) 19.5 MCG/DAY IUD IUD 1 each by Intrauterine route once.    . lisdexamfetamine (VYVANSE) 60 MG capsule Take 1 capsule (60 mg total) by mouth every morning. 30 capsule 0  . [START ON 01/23/2020] lisdexamfetamine (VYVANSE) 60 MG capsule Take 1  capsule (60 mg total) by mouth every morning. Take daily at 8:30 AM 30 capsule 0  . lurasidone (LATUDA) 40 MG TABS tablet Take 1 tablet (40 mg total) by mouth daily with breakfast. 30 tablet 1  . Lurasidone HCl 60 MG TABS Take 1 tablet (60 mg total) by mouth daily with supper. 30 tablet 1  . Melatonin 10 MG TABS Take 20-40 mg by mouth at bedtime as needed (for sleep).     . nitrofurantoin, macrocrystal-monohydrate, (MACROBID) 100 MG capsule Take by mouth.    . pantoprazole (PROTONIX) 40 MG tablet Take 40 mg by mouth 2 (two) times daily.    . pregabalin (LYRICA) 150 MG capsule Take 150 mg by mouth 2 (two) times daily.    . promethazine (PHENERGAN) 25 MG tablet Take 1 tablet (25 mg total) by mouth every 6 (six) hours as needed for nausea or vomiting. (Patient not taking: Reported on 05/16/2019) 30 tablet 0  . triamcinolone cream (KENALOG) 0.1 % SMARTSIG:Sparingly Topical Twice Daily    . venlafaxine XR (EFFEXOR XR) 150 MG 24 hr capsule Take 1 capsule (150 mg total) by mouth daily with breakfast. 30 capsule 1   No current facility-administered medications for this visit.     Musculoskeletal: Strength & Muscle Tone: UTA Gait & Station: UTA Patient leans: N/A  Psychiatric Specialty Exam: Review of Systems Anxious and Depressed Psychiatric/Behavioral: Positive for dysphoric mood and sleep disturbance. The patient is nervous/anxious.   All other systems reviewed and are negative.   There were no vitals taken for this visit.There is no height or weight on file to calculate BMI.  General Appearance: Casual  Eye Contact:  Fair  Speech:  Clear and Coherent  Volume:  Normal  Mood:  Depressed  Affect:  Congruent  Thought Process:  Goal Directed and Descriptions of Associations: Intact  Orientation:  Full (Time, Place, and Person)  Thought Content: Rumination   Suicidal Thoughts:  No  Homicidal Thoughts:  No  Memory:  Immediate;   Fair Recent;   Fair Remote;   Fair  Judgement:  Fair   Insight:  Fair  Psychomotor Activity:  Decreased  Concentration:  Concentration: Fair and Attention Span: Fair  Recall:  [employer]  of Knowledge: Fair  Language: Fair  Akathisia:  No  Handed:  Right  AIMS (if indicated): UTA  Assets:  Manufacturing systems engineer Housing  ADL's:  Intact  Cognition: WNL  Sleep:  Excessive   Screenings: AIMS   Flowsheet Row [facility]  AIMS Total Score 0    AUDIT   Flowsheet Row [facility]  Alcohol Use Disorder Identification Test Final Score (AUDIT) 0       Assessment and Plan: Denise Conrad is a 29 year old Caucasian female, apply for disability, lives in [place], has a history of bipolar disorder, ADD, sinus tachycardia, hypersomnia, obstructive sleep apnea was evaluated by telemedicine today.  Patient is biologically predisposed given her history of trauma family history of mental health problems, her own health issues.  Patient with psychosocial stressors of being single, current pandemic, relationship struggles, unemployed.  Patient reports she continues to struggle with depressive symptoms although manic symptoms have improved.  She also continues to struggle with excessive sleepiness during the day and was referred for sleep specialist consultation.  Plan Bipolar disorder current episode mixed-unstable Increase Latuda to 100 mg p.o. daily with supper Continue CBT  PTSD-some progress BuSpar 15 mg p.o. twice daily Venlafaxine as prescribed Klonopin 0.5 mg as needed for severe anxiety attacks only.  She has been limiting use I have reviewed [place] controlled substance database  Panic attacks/anxiety attacks-improving Klonopin as prescribed for breakthrough anxiety only. Hydroxyzine 25 to 50 mg p.o. daily as needed for severe anxiety attacks Venlafaxine 150 mg p.o. daily at reduced  dosage   ADHD-stable Vyvanse 60 mg p.o. daily  Sleep disorder-unstable Patient with obstructive sleep apnea currently compliant with CPAP Patient was referred for sleep study-I have reviewed notes per Dr. Michelene Gardener 12/18/2019 as noted above. Patient will continue to follow-up with sleep specialist  Discussed with patient since her depressive symptoms continues to be unstable in spite of several medication trials in the past, as well as she continues to struggle with excessive sleepiness-unknown if her depressive symptoms are also contributing to the excessive sleepiness during the day.  Will refer for ECT consultation.  Follow-up in clinic in 4 weeks or sooner if needed.  I have spent atleast 30 minutes face to face by video with patient today. More than 50 % of the time was spent for preparing to see the patient ( e.g., review of test, records ), obtaining and to review and separately obtained history , ordering medications and test ,psychoeducation and supportive psychotherapy and care coordination,as well as documenting clinical information in electronic health record. This note was generated in part or whole with voice recognition software. Voice recognition is usually quite accurate but there are transcription errors that can and very often do occur. I apologize for any typographical errors that were not detected and corrected.      Jomarie Longs, MD 01/16/2020, 8:23 AM

## 2020-01-16 ENCOUNTER — Encounter: Payer: Self-pay | Admitting: Psychiatry

## 2020-01-26 ENCOUNTER — Other Ambulatory Visit: Payer: Self-pay | Admitting: Psychiatry

## 2020-01-26 DIAGNOSIS — F3162 Bipolar disorder, current episode mixed, moderate: Secondary | ICD-10-CM

## 2020-01-26 DIAGNOSIS — F4310 Post-traumatic stress disorder, unspecified: Secondary | ICD-10-CM

## 2020-01-28 ENCOUNTER — Other Ambulatory Visit: Payer: Self-pay | Admitting: Psychiatry

## 2020-01-28 DIAGNOSIS — F4310 Post-traumatic stress disorder, unspecified: Secondary | ICD-10-CM

## 2020-01-29 ENCOUNTER — Institutional Professional Consult (permissible substitution): Payer: Medicaid Other | Admitting: Psychiatry

## 2020-02-12 ENCOUNTER — Encounter: Payer: Self-pay | Admitting: Psychiatry

## 2020-02-12 ENCOUNTER — Other Ambulatory Visit: Payer: Self-pay

## 2020-02-12 ENCOUNTER — Ambulatory Visit (INDEPENDENT_AMBULATORY_CARE_PROVIDER_SITE_OTHER): Payer: Medicaid Other | Admitting: Psychiatry

## 2020-02-12 DIAGNOSIS — F4310 Post-traumatic stress disorder, unspecified: Secondary | ICD-10-CM

## 2020-02-12 DIAGNOSIS — F314 Bipolar disorder, current episode depressed, severe, without psychotic features: Secondary | ICD-10-CM | POA: Diagnosis not present

## 2020-02-12 NOTE — Progress Notes (Signed)
Virtual Visit via Telephone Note  I connected with Denise Conrad on 02/12/20 at  1:00 PM EST by telephone and verified that I am speaking with the correct person using two identifiers.  Location: Patient: Home Provider: Hospital    I discussed the limitations, risks, security and privacy concerns of performing an evaluation and management service by telephone and the availability of in person appointments. I also discussed with the patient that there may be a patient responsible charge related to this service. The patient expressed understanding and agreed to proceed.   History of Present Illness: Contacted for ECT consult.  Patient reached by telephone.  Established identities and purpose of visit.  This was a referral for an ECT evaluation.  Patient reports current symptoms of depressed mood low energy sleeping much of the day, negative thinking about herself, sad mood, but denies any suicidal ideation.  Denies any hallucinations.  States that she is compliant with her current medication combination of Effexor BuSpar and Latuda.  Diagnosis is bipolar disorder and her last manic episode was over a month ago and was a shorter duration than it has been in the past.  Patient was referred by her outpatient psychiatrist to consider ECT because of ongoing depression despite trials of multiple medications.  Patient has minimal medical problems outside of mental health issues.  Denies alcohol or drug use.  Past history of manic episodes as noted above.  Manic episodes will include hyperactivity lack of sleep risky behavior including gambling.  Not clear that she ever has any psychotic symptoms.  She has had multiple medical trials in the past including Zoloft Paxil Lamictal lithium and Abilify.  Current medication combination seems to be more effective than anything previously tried.  Patient does have a past history of suicide attempts last 1 about 2017.  No hospitalizations in a long time.  Denies any  history of alcohol or drug abuse.  Patient lives with her child with her own parents.  Not currently working outside the home.  Family is supportive but she has to provide primary care for her son.      Observations/Objective: Patient was on time and appropriate.  Ask appropriate questions.  Showed good insight and understanding of her illness.  Current mood stated as depressed.  Affect appropriate and reactive.  Thoughts appeared to be lucid with no signs of delusional thinking.  Denied any current suicidal or homicidal thoughts.  Denied any psychotic symptoms.   Assessment and Plan: Patient was advised that she would be a potential candidate for ECT.  The limits of treatment as well as the potential benefits were discussed.  Potential benefits include improvement in current depressive symptoms and to a lesser extent potential for improved overall course of bipolar disorder.  Risks include short-term memory impairment muscle aches and pains headache and risks of anesthesia.  Patient appeared alert and oriented and able to make reasonable decisions.  Patient stated that because of the need for a ride in the late time of day that we are currently doing ECT she would not be able to start treatment until summer at the earliest.  Patient was advised that she should call back to our office if she has any further questions or has any new developments and wishes to start treatment.   Follow Up Instructions:    I discussed the assessment and treatment plan with the patient. The patient was provided an opportunity to ask questions and all were answered. The patient agreed with the plan  and demonstrated an understanding of the instructions.   The patient was advised to call back or seek an in-person evaluation if the symptoms worsen or if the condition fails to improve as anticipated.  I provided 30 minutes of non-face-to-face time during this encounter.   Mordecai Rasmussen, MD

## 2020-02-13 ENCOUNTER — Encounter: Payer: Self-pay | Admitting: Psychiatry

## 2020-02-13 ENCOUNTER — Telehealth (INDEPENDENT_AMBULATORY_CARE_PROVIDER_SITE_OTHER): Payer: Medicaid Other | Admitting: Psychiatry

## 2020-02-13 DIAGNOSIS — F900 Attention-deficit hyperactivity disorder, predominantly inattentive type: Secondary | ICD-10-CM

## 2020-02-13 DIAGNOSIS — F410 Panic disorder [episodic paroxysmal anxiety] without agoraphobia: Secondary | ICD-10-CM | POA: Diagnosis not present

## 2020-02-13 DIAGNOSIS — F314 Bipolar disorder, current episode depressed, severe, without psychotic features: Secondary | ICD-10-CM

## 2020-02-13 DIAGNOSIS — F4310 Post-traumatic stress disorder, unspecified: Secondary | ICD-10-CM

## 2020-02-13 DIAGNOSIS — G479 Sleep disorder, unspecified: Secondary | ICD-10-CM

## 2020-02-13 NOTE — Progress Notes (Signed)
Virtual Visit via Video Note  I connected with Denise Conrad on 02/13/20 at  1:30 PM EST by a video enabled telemedicine application and verified that I am speaking with the correct person using two identifiers.  Location: Patient: Denise Conrad, Parent's home Provider: [facility]   I discussed the limitations of evaluation and management by telemedicine and the availability of in person appointments. The patient expressed understanding and agreed to proceed.   I discussed the assessment and treatment plan with the patient. The patient was provided an opportunity to ask questions and all were answered. The patient agreed with the plan and demonstrated an understanding of the instructions.   The patient was advised to call back or seek an in-person evaluation if the symptoms worsen or if the condition fails to improve as anticipated.   BH MD OP Progress Note  02/13/2020 2:32 PM TYMESHA DITMORE  MRN:  [id number]  Chief Complaint:  Chief Complaint    Follow-up     HPI: Denise Conrad is a 30 year old Caucasian female, unemployed, has applied for disability, has a history of bipolar disorder, PTSD, ADHD, panic attacks, sleep problems, sinus tachycardia was evaluated by telemedicine today.  Patient today reports she continues to struggle with excessive sleepiness during the day.  She reports her depressive symptoms however has not worsened since her last appointment with Clinical research associate.  She does feel sad on and off.  She also struggles with lack of motivation.  Patient reports the Latuda increased dosage does help.  She has not had manic episodes since she last spoke to Clinical research associate.  She reports she was started on Nuvigil recently by her sleep specialist.  She will be picking up the medication today.  Patient continues to take the Vyvanse as prescribed.  She denies any suicidality, homicidality or perceptual disturbances.  Patient reports she had ECT consultation with Dr. Toni Amend  yesterday-02/12/2020.  Patient however reports she will have to wait till the summer to get the ECT done since she has no one to pick her son up from school.  Patient denies any other concerns today.  Visit Diagnosis:    ICD-10-CM   1. Bipolar 1 disorder, depressed, severe (HCC)  F31.4   2. PTSD (post-traumatic stress disorder)  F43.10   3. Attention deficit hyperactivity disorder (ADHD), predominantly inattentive type  F90.0   4. Panic attacks  F41.0   5. Sleep disorder  G47.9    OSA on CPAP, Hypersomnia    Past Psychiatric History: I have reviewed past psychiatric history from my progress note on 09/26/2019.  Past trials of lithium, Lamictal, Zyprexa, Cymbalta, Vyvanse, Wellbutrin, Abilify, Prozac, Lexapro, Paxil, Zoloft  Past Medical History:  Past Medical History:  Diagnosis Date  . Acute renal failure (HCC) 11/22/2014  . Acute suppurative tonsillitis 11/20/2014  . Anxiety   . Bipolar 1 disorder (HCC)   . Depression   . Fibromyalgia   . 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 Sepsis (HCC) 11/20/2014    Past Surgical History:  Procedure Laterality Date  . IUD INSERTION  05/16/2019      . TEMPOROMANDIBULAR JOINT ARTHROPLASTY  2015  . TONSILLECTOMY    . WISDOM TOOTH EXTRACTION      Family Psychiatric History: I have reviewed family psychiatric history from my progress note on 09/26/2019  Family History:  Family History  Problem Relation Age of Onset  . Hashimoto's thyroiditis Mother   . Fibromyalgia Mother   . Hypertension Father   . Heart failure  Father        hyptertensive cardiovascular disease  . Schizophrenia Father   . Suicidality Maternal Grandmother   . Bipolar disorder Paternal Grandmother     Social History: Reviewed social history from my progress note on 09/26/2019 Social History   Socioeconomic History  . Marital status: Single    Spouse name: Not on file  . Number of children: Not on file  . Years of education: Not on file  . Highest education level:  Not on file  Occupational History  . Not on file  Tobacco Use  . Smoking status: Never Smoker  . Smokeless tobacco: Never Used  Vaping Use  . Vaping Use: Never used  Substance and Sexual Activity  . Alcohol use: No  . Drug use: No  . Sexual activity: Yes    Partners: Male  Other Topics Concern  . Not on file  Social History Narrative  . Not on file   Social Determinants of Health   Financial Resource Strain: Not on file  Food Insecurity: Not on file  Transportation Needs: Not on file  Physical Activity: Not on file  Stress: Not on file  Social Connections: Not on file    Allergies:  Allergies  Allergen Reactions  . Saphris [Asenapine] Shortness Of Breath and Other (See Comments)    Migraines, also  . Adhesive [Tape] Itching  . Topamax [Topiramate] Other (See Comments)    Gave her migraines that made her pass out  . Zofran [Ondansetron] Hives  . Latex Rash  . Sulfa Antibiotics Rash    Metabolic Disorder Labs: Lab Results  Component Value Date   HGBA1C 5.6 08/21/2015   MPG 114 08/21/2015   Lab Results  Component Value Date   PROLACTIN 35.1 (H) 08/21/2015   PROLACTIN 68.9 (H) 08/20/2015   Lab Results  Component Value Date   CHOL 208 (H) 08/20/2015   TRIG 212 (H) 08/20/2015   HDL 58 08/20/2015   CHOLHDL 3.6 08/20/2015   VLDL 42 (H) 08/20/2015   LDLCALC 108 (H) 08/20/2015   Lab Results  Component Value Date   TSH 3.976 08/20/2015    Therapeutic Level Labs: No results found for: LITHIUM No results found for: VALPROATE No components found for:  CBMZ  Current Medications: Current Outpatient Medications  Medication Sig Dispense Refill  . Armodafinil 150 MG tablet Take by mouth.    Marland Kitchen amoxicillin (AMOXIL) 875 MG tablet Take 875 mg by mouth 2 (two) times daily.    . baclofen (LIORESAL) 10 MG tablet TAKE 1 TABLET BY MOUTH THREE TIMES A DAY AS NEEDED    . BOTOX 200 units SOLR     . busPIRone (BUSPAR) 15 MG tablet Take 1 tablet (15 mg total) by mouth 2  (two) times daily. 60 tablet 1  . calcium carbonate (OS-CAL) 1250 (500 Ca) MG chewable tablet Chew by mouth. (Patient not taking: Reported on 09/26/2019)    . calcium carbonate (TUMS - DOSED IN MG ELEMENTAL CALCIUM) 500 MG chewable tablet Chew 1-2 tablets by mouth as needed for indigestion or heartburn. (Patient not taking: Reported on 09/26/2019)    . cetirizine (ZYRTEC) 10 MG tablet Take 10 mg by mouth at bedtime.  12  . clonazePAM (KLONOPIN) 0.5 MG tablet Take 1 tablet (0.5 mg total) by mouth daily as needed for anxiety. Start taking once a day as needed for severe anxiety attacks only 10 tablet 0  . Cyanocobalamin (B-12) 1000 MCG CAPS Take 1,000 mcg by mouth daily.     Marland Kitchen  fesoterodine (TOVIAZ) 8 MG TB24 tablet Take 8 mg by mouth at bedtime.    . hydrOXYzine (ATARAX/VISTARIL) 25 MG tablet TAKE 1-2 TABLETS (25-50 MG TOTAL) BY MOUTH DAILY AS NEEDED. FOR SEVERE PANIC ATTACKS ONLY 180 tablet 1  . ibuprofen (ADVIL,MOTRIN) 200 MG tablet Take 800 mg by mouth every 6 (six) hours as needed (for headaches).    Marland Kitchen levonorgestrel (LILETTA, 52 MG,) 19.5 MCG/DAY IUD IUD 1 each by Intrauterine route once.    . lisdexamfetamine (VYVANSE) 60 MG capsule Take 1 capsule (60 mg total) by mouth every morning. 30 capsule 0  . lisdexamfetamine (VYVANSE) 60 MG capsule Take 1 capsule (60 mg total) by mouth every morning. Take daily at 8:30 AM 30 capsule 0  . lurasidone (LATUDA) 40 MG TABS tablet Take 1 tablet (40 mg total) by mouth daily with breakfast. 30 tablet 1  . Lurasidone HCl 60 MG TABS Take 1 tablet (60 mg total) by mouth daily with supper. 30 tablet 1  . Melatonin 10 MG TABS Take 20-40 mg by mouth at bedtime as needed (for sleep).     . nitrofurantoin, macrocrystal-monohydrate, (MACROBID) 100 MG capsule Take by mouth.    . pantoprazole (PROTONIX) 40 MG tablet Take 40 mg by mouth 2 (two) times daily.    . pregabalin (LYRICA) 150 MG capsule Take 150 mg by mouth 2 (two) times daily.    . promethazine (PHENERGAN) 25 MG  tablet Take 1 tablet (25 mg total) by mouth every 6 (six) hours as needed for nausea or vomiting. (Patient not taking: Reported on 05/16/2019) 30 tablet 0  . triamcinolone cream (KENALOG) 0.1 % SMARTSIG:Sparingly Topical Twice Daily    . venlafaxine XR (EFFEXOR-XR) 150 MG 24 hr capsule TAKE 1 CAPSULE BY MOUTH DAILY WITH BREAKFAST. 90 capsule 0   No current facility-administered medications for this visit.     Musculoskeletal: Strength & Muscle Tone: UTA Gait & Station: UTA Patient leans: N/A  Psychiatric Specialty Exam: Review of Systems  Psychiatric/Behavioral: Positive for dysphoric mood and sleep disturbance.  All other systems reviewed and are negative.   There were no vitals taken for this visit.There is no height or weight on file to calculate BMI.  General Appearance: Casual  Eye Contact:  Fair  Speech:  Clear and Coherent  Volume:  Normal  Mood:  Depressed  Affect:  Congruent  Thought Process:  Goal Directed and Descriptions of Associations: Intact  Orientation:  Full (Time, Place, and Person)  Thought Content: Logical   Suicidal Thoughts:  No  Homicidal Thoughts:  No  Memory:  Immediate;   Fair Recent;   Fair Remote;   Fair  Judgement:  Fair  Insight:  Fair  Psychomotor Activity:  Normal  Concentration:  Concentration: Fair and Attention Span: Fair  Recall:  [employer] of Knowledge: Fair  Language: Fair  Akathisia:  No  Handed:  Right  AIMS (if indicated): UTA  Assets:  Communication Skills Desire for Improvement Housing  ADL's:  Intact  Cognition: WNL  Sleep:  Excessive   Screenings: AIMS   Flowsheet Row [facility]  AIMS Total Score 0    AUDIT   Flowsheet Row [facility]  Alcohol Use Disorder Identification Test Final Score (AUDIT) 0       Assessment and Plan: Denise Conrad is a 30 year old  Caucasian female, applied for disability, lives in [place], has a history  of bipolar disorder, ADD, sinus tachycardia, hypersomnia, obstructive sleep apnea was evaluated by telemedicine today.  Patient is biologically predisposed given her multiple health issues, history of trauma, family history of mental health problems.  Patient with psychosocial stressors of being single, current pandemic, relationship struggles, being unemployed.  Patient continues to struggle with excessive sleepiness during the day however was started on a new medication, Nuvigil per sleep provider which she will be starting soon.  Discussed plan as noted below.  Plan Bipolar disorder current episode mixed- improving Continue Latuda 100 mg p.o. daily with supper Continue CBT Patient was referred for ECT consultation-I have reviewed notes per Dr. Toni Amendlapacs dated 02/12/2020-reviewed with patient.  Patient wants to wait prior to getting ECT due to not having enough support. Provided education to patient.  PTSD-some progress BuSpar 15 mg p.o. twice daily Venlafaxine as prescribed Klonopin 0.5 mg as needed for severe anxiety attacks only.  She has been limiting use. I have reviewed and see controlled substance database  Panic attacks/anxiety attacks- improving Klonopin is prescribed for breakthrough anxiety only Hydroxyzine 25-50 mg p.o. daily as needed for severe anxiety attacks Venlafaxine 150 mg p.o. daily at reduced dosage  ADHD-stable Will continue Vyvanse 60 mg p.o. daily for now. However discussed with patient to come into the office to monitor her blood pressure and heart rate within the next 2 weeks.  Further prescription for Vyvanse will only be provided once her heart rate and pulse rate is monitored.  Patient agrees with the same.   Sleep disorder-unstable Patient with obstructive sleep apnea currently compliant with CPAP Patient was recently started on Nuvigil, she will pick up the prescription from the  pharmacy today. I have reviewed notes per Ms.Gery PrayLindsay Boles FNP - dated 02/08/2020 - " start Nuvigil 150 mg p.o. daily.  Continue Vyvanse for now.  Patient compliant with CPAP.  Patient with diagnosis of sleep apnea, daytime hypersomnia, morbid obesity.'  Follow-up in clinic in 3 weeks or sooner if needed.  I have spent atleast 20 minutes face to face by video with patient today. More than 50 % of the time was spent for preparing to see the patient ( e.g., review of test, records ), obtaining and to review and separately obtained history , ordering medications and test ,psychoeducation and supportive psychotherapy and care coordination,as well as documenting clinical information in electronic health record. This note was generated in part or whole with voice recognition software. Voice recognition is usually quite accurate but there are transcription errors that can and very often do occur. I apologize for any typographical errors that were not detected and corrected.      Denise LongsSaramma Herron Fero, MD 02/13/2020, 2:32 PM

## 2020-02-21 ENCOUNTER — Telehealth: Payer: Self-pay

## 2020-02-21 DIAGNOSIS — F410 Panic disorder [episodic paroxysmal anxiety] without agoraphobia: Secondary | ICD-10-CM

## 2020-02-21 MED ORDER — CLONAZEPAM 0.5 MG PO TABS
0.5000 mg | ORAL_TABLET | Freq: Every day | ORAL | 0 refills | Status: DC | PRN
Start: 2020-02-21 — End: 2020-03-11

## 2020-02-21 NOTE — Telephone Encounter (Signed)
received a fax stating that pt is not getting the [facility] [place].  she needs [facility] [place].

## 2020-02-21 NOTE — Telephone Encounter (Signed)
I have sent Klonopin to [facility] in [place].

## 2020-03-01 ENCOUNTER — Other Ambulatory Visit: Payer: Self-pay | Admitting: Psychiatry

## 2020-03-01 DIAGNOSIS — F4310 Post-traumatic stress disorder, unspecified: Secondary | ICD-10-CM

## 2020-03-01 DIAGNOSIS — F3162 Bipolar disorder, current episode mixed, moderate: Secondary | ICD-10-CM

## 2020-03-05 ENCOUNTER — Other Ambulatory Visit: Payer: Self-pay

## 2020-03-05 ENCOUNTER — Telehealth (INDEPENDENT_AMBULATORY_CARE_PROVIDER_SITE_OTHER): Payer: Medicaid Other | Admitting: Psychiatry

## 2020-03-05 ENCOUNTER — Encounter: Payer: Self-pay | Admitting: Psychiatry

## 2020-03-05 DIAGNOSIS — F410 Panic disorder [episodic paroxysmal anxiety] without agoraphobia: Secondary | ICD-10-CM

## 2020-03-05 DIAGNOSIS — F314 Bipolar disorder, current episode depressed, severe, without psychotic features: Secondary | ICD-10-CM

## 2020-03-05 DIAGNOSIS — Z79899 Other long term (current) drug therapy: Secondary | ICD-10-CM

## 2020-03-05 DIAGNOSIS — F4310 Post-traumatic stress disorder, unspecified: Secondary | ICD-10-CM

## 2020-03-05 DIAGNOSIS — F900 Attention-deficit hyperactivity disorder, predominantly inattentive type: Secondary | ICD-10-CM

## 2020-03-05 DIAGNOSIS — G479 Sleep disorder, unspecified: Secondary | ICD-10-CM

## 2020-03-05 MED ORDER — BUSPIRONE HCL 15 MG PO TABS: 15.0000 mg | ORAL_TABLET | Freq: Two times a day (BID) | ORAL | 1 refills | Status: DC

## 2020-03-05 MED ORDER — LURASIDONE HCL 40 MG PO TABS: 40.0000 mg | ORAL_TABLET | Freq: Every day | ORAL | 1 refills | Status: DC

## 2020-03-05 MED ORDER — LURASIDONE HCL 60 MG PO TABS: 60.0000 mg | ORAL_TABLET | Freq: Every day | ORAL | 1 refills | Status: DC

## 2020-03-05 MED ORDER — LISDEXAMFETAMINE DIMESYLATE 60 MG PO CAPS: 60.0000 mg | ORAL_CAPSULE | ORAL | 0 refills | Status: DC

## 2020-03-05 NOTE — Progress Notes (Signed)
Virtual Visit via Video Note  I connected with Denise Conrad on 03/05/20 at  3:00 PM EST by a video enabled telemedicine application and verified that I am speaking with the correct person using two identifiers.  Location Provider Location : [facility] Patient Location : Home  Participants: Patient , Provider    I discussed the limitations of evaluation and management by telemedicine and the availability of in person appointments. The patient expressed understanding and agreed to proceed.   I discussed the assessment and treatment plan with the patient. The patient was provided an opportunity to ask questions and all were answered. The patient agreed with the plan and demonstrated an understanding of the instructions.   The patient was advised to call back or seek an in-person evaluation if the symptoms worsen or if the condition fails to improve as anticipated.   BH MD OP Progress Note  03/05/2020 3:28 PM LAVORIS CANIZALES  MRN:  [id number]  Chief Complaint:  Chief Complaint    Follow-up     HPI: Denise Conrad is a 30 year old Caucasian female, unemployed, has applied for disability, has a history of bipolar disorder, PTSD, ADHD, panic attacks, sleep problems, sinus tachycardia was evaluated by telemedicine today.  Patient reports she continues to struggle with excessive sleepiness during the day.  She reports her mood is always low in spite of taking all these medications.  She still enjoys her time with her son.  She reports her appetite as good.  She reports she currently does not have any suicidality, homicidality or perceptual disturbances.  Patient reports she has not started the [facility] for her excessive sleepiness yet and is waiting her health insurance to approve it.  Patient continues to follow-up with sleep specialist.  Patient patient continues to take the Vyvanse for ADHD however has been noncompliant with blood pressure and heart rate monitoring as discussed  last visit.  Patient today reports she lost her way while coming to the clinic this morning.  She agrees to come back into the clinic within the next 1 week to check her blood pressure and heart rate.  Patient denies any manic episodes.  Reports she is compliant on the Latuda.  Reports panic attacks on and off and reports the Klonopin as beneficial.  She uses the Klonopin rarely.  Patient denies any other concerns today.  Visit Diagnosis:    ICD-10-CM   1. Bipolar 1 disorder, depressed, severe (HCC)  F31.4 Lurasidone HCl 60 MG TABS    lurasidone (LATUDA) 40 MG TABS tablet   some progress  2. PTSD (post-traumatic stress disorder)  F43.10 busPIRone (BUSPAR) 15 MG tablet  3. Attention deficit hyperactivity disorder (ADHD), predominantly inattentive type  F90.0 lisdexamfetamine (VYVANSE) 60 MG capsule  4. Panic attacks  F41.0 busPIRone (BUSPAR) 15 MG tablet  5. Sleep disorder  G47.9    OSA on CPAP, Hypersomnia  6. High risk medication use  Z79.899 DRUG MONITOR, PANEL 1, W/CONF, URINE    Past Psychiatric History: I have reviewed past psychiatric history from my progress note on 09/26/2019.  Past trials of lithium, Lamictal, Zyprexa, Cymbalta, Vyvanse, Wellbutrin, Abilify, Prozac, Lexapro, Paxil, Zoloft  Past Medical History:  Past Medical History:  Diagnosis Date   Acute renal failure (HCC) 11/22/2014   Acute suppurative tonsillitis 11/20/2014   Anxiety    Bipolar 1 disorder (HCC)    Depression    Fibromyalgia    Liver disease    tumor on liver   Sepsis (HCC) 11/20/2014  Past Surgical History:  Procedure Laterality Date   IUD INSERTION  05/16/2019       TEMPOROMANDIBULAR JOINT ARTHROPLASTY  2015   TONSILLECTOMY     WISDOM TOOTH EXTRACTION      Family Psychiatric History: I have reviewed family psychiatric history from my progress note on 09/26/2019.  Family History:  Family History  Problem Relation Age of Onset   Hashimoto's thyroiditis Mother     Fibromyalgia Mother    Hypertension Father    Heart failure Father        hyptertensive cardiovascular disease   Schizophrenia Father    Suicidality Maternal Grandmother    Bipolar disorder Paternal Grandmother     Social History: I have reviewed social history from my progress note on 09/26/2019. Social History   Socioeconomic History   Marital status: Single    Spouse name: Not on file   Number of children: Not on file   Years of education: Not on file   Highest education level: Not on file  Occupational History   Not on file  Tobacco Use   Smoking status: Never Smoker   Smokeless tobacco: Never Used  Vaping Use   Vaping Use: Never used  Substance and Sexual Activity   Alcohol use: No   Drug use: No   Sexual activity: Yes    Partners: Male  Other Topics Concern   Not on file  Social History Narrative   Not on file   Social Determinants of Health   Financial Resource Strain: Not on file  Food Insecurity: Not on file  Transportation Needs: Not on file  Physical Activity: Not on file  Stress: Not on file  Social Connections: Not on file    Allergies:  Allergies  Allergen Reactions   Saphris [Asenapine] Shortness Of Breath and Other (See Comments)    Migraines, also   Adhesive [Tape] Itching   Topamax [Topiramate] Other (See Comments)    Gave her migraines that made her pass out   Zofran [Ondansetron] Hives   Latex Rash   Sulfa Antibiotics Rash    Metabolic Disorder Labs: Lab Results  Component Value Date   HGBA1C 5.6 08/21/2015   MPG 114 08/21/2015   Lab Results  Component Value Date   PROLACTIN 35.1 (H) 08/21/2015   PROLACTIN 68.9 (H) 08/20/2015   Lab Results  Component Value Date   CHOL 208 (H) 08/20/2015   TRIG 212 (H) 08/20/2015   HDL 58 08/20/2015   CHOLHDL 3.6 08/20/2015   VLDL 42 (H) 08/20/2015   LDLCALC 108 (H) 08/20/2015   Lab Results  Component Value Date   TSH 3.976 08/20/2015    Therapeutic Level  Labs: No results found for: LITHIUM No results found for: VALPROATE No components found for:  CBMZ  Current Medications: Current Outpatient Medications  Medication Sig Dispense Refill   baclofen (LIORESAL) 10 MG tablet TAKE 1 TABLET BY MOUTH THREE TIMES A DAY AS NEEDED     BOTOX 200 units SOLR      calcium carbonate (TUMS - DOSED IN MG ELEMENTAL CALCIUM) 500 MG chewable tablet Chew 1-2 tablets by mouth as needed for indigestion or heartburn.     cetirizine (ZYRTEC) 10 MG tablet Take 10 mg by mouth at bedtime.  12   clonazePAM (KLONOPIN) 0.5 MG tablet Take 1 tablet (0.5 mg total) by mouth daily as needed for anxiety. Start taking once a day as needed for severe anxiety attacks only 10 tablet 0   Cyanocobalamin (B-12) 1000 MCG  CAPS Take 1,000 mcg by mouth daily.      fesoterodine (TOVIAZ) 8 MG TB24 tablet Take 8 mg by mouth at bedtime.     levonorgestrel (LILETTA, 52 MG,) 19.5 MCG/DAY IUD IUD 1 each by Intrauterine route once.     lisdexamfetamine (VYVANSE) 60 MG capsule Take 1 capsule (60 mg total) by mouth every morning. Take daily at 8:30 AM 30 capsule 0   Melatonin 10 MG TABS Take 20-40 mg by mouth at bedtime as needed (for sleep).      pantoprazole (PROTONIX) 40 MG tablet Take 40 mg by mouth 2 (two) times daily.     pregabalin (LYRICA) 150 MG capsule Take 150 mg by mouth 2 (two) times daily.     venlafaxine XR (EFFEXOR-XR) 150 MG 24 hr capsule TAKE 1 CAPSULE BY MOUTH DAILY WITH BREAKFAST. 90 capsule 0   amoxicillin (AMOXIL) 875 MG tablet Take 875 mg by mouth 2 (two) times daily. (Patient not taking: Reported on 03/05/2020)     Armodafinil 150 MG tablet Take by mouth. (Patient not taking: Reported on 03/05/2020)     busPIRone (BUSPAR) 15 MG tablet Take 1 tablet (15 mg total) by mouth 2 (two) times daily. 60 tablet 1   lisdexamfetamine (VYVANSE) 60 MG capsule Take 1 capsule (60 mg total) by mouth every morning. 7 capsule 0   lurasidone (LATUDA) 40 MG TABS tablet Take 1 tablet  (40 mg total) by mouth daily with breakfast. 30 tablet 1   Lurasidone HCl 60 MG TABS Take 1 tablet (60 mg total) by mouth daily with supper. 30 tablet 1   No current facility-administered medications for this visit.     Musculoskeletal: Strength & Muscle Tone: UTA Gait & Station: UTA Patient leans: N/A  Psychiatric Specialty Exam: Review of Systems  Psychiatric/Behavioral: Positive for dysphoric mood and sleep disturbance.  All other systems reviewed and are negative.   There were no vitals taken for this visit.There is no height or weight on file to calculate BMI.  General Appearance: Casual  Eye Contact:  Fair  Speech:  Clear and Coherent  Volume:  Normal  Mood:  Dysphoric  Affect:  Congruent  Thought Process:  Goal Directed and Descriptions of Associations: Intact  Orientation:  Full (Time, Place, and Person)  Thought Content: Logical   Suicidal Thoughts:  No  Homicidal Thoughts:  No  Memory:  Immediate;   Fair Recent;   Fair Remote;   Fair  Judgement:  Fair  Insight:  Fair  Psychomotor Activity:  Normal  Concentration:  Concentration: Fair and Attention Span: Fair  Recall:  [employer] of Knowledge: Fair  Language: Fair  Akathisia:  No  Handed:  Right  AIMS (if indicated): UTA  Assets:  Communication Skills Desire for Improvement Housing Social Support  ADL's:  Intact  Cognition: WNL  Sleep:  Excessive   Screenings: AIMS   Flowsheet Row [facility]  AIMS Total Score 0    AUDIT   Flowsheet Row [facility]) [facility]  Alcohol Use Disorder Identification Test Final Score (AUDIT) 0       Assessment and Plan: YAARA TEUSCHER is a 30 year old Caucasian female, applied for disability, lives in [place], has a history of bipolar disorder, ADD, sinus tachycardia, hypersomnia, obstructive sleep apnea was evaluated by  telemedicine today.  Patient is biologically predisposed given her multiple health issues, history of trauma, family history of  mental health problems.  Patient with psychosocial stressors of being single, current pandemic, relationship struggles, financial problems.  Patient with excessive sleepiness, hypersomnia during the day continues to struggle with low mood although reports manic episodes as improved on the [place].  Discussed plan as noted below.  Plan Bipolar disorder current episode, depressed-improving Patient with hypersomnia, morbid obesity, on polypharmacy which could all be contributing to her low mood, lack of motivation during the day. Patient will benefit from psychotherapy sessions.  She reports she is already in therapy.  Advised patient to sign release of information to request medical records from her therapist.  If patient continues to have depressive symptoms she will benefit from more intensive programs like IOP or PHP. Continue Latuda 100 mg p.o. daily with supper Patient was referred for ECT consultation-patient however reports she does not want to do it at this time and wants to wait.   PTSD-improving BuSpar 15 mg p.o. twice daily Venlafaxine as prescribed. Klonopin 0.5 mg as needed for severe anxiety attacks only.  She has been limiting use.  Panic attacks/anxiety attacks-improving Klonopin as prescribed for breakthrough anxiety only. Hydroxyzine 25-50 mg p.o. daily as needed for severe anxiety attacks Venlafaxine 150 mg p.o. daily at reduced dosage.  ADHD-stable We will provide Vyvanse 60 mg p.o. daily-7-day supply.  Patient was advised to come into the clinic for evaluation of blood pressure and heart rate however she has been noncompliant. Patient encouraged to get blood pressure and heart rate done at the office in the next few days for further refills to be sent to the pharmacy.   Sleep disorder-unstable Patient with obstructive sleep apnea currently  compliant with CPAP. Patient with hypersomnia-we will continue to benefit from management of the same-she was recently prescribed Nuvigil-pending.  She will follow-up with sleep specialist.  High risk medication use-patient will benefit from blood pressure and heart rate monitoring.  Patient also will benefit from urine drug screen.  Patient advised to come into the clinic within the next 1 week for the same.  Follow-up in clinic in 4 weeks or sooner if needed.  Patient to come into the clinic within the next 1 week for blood pressure and heart rate evaluation since she is on stimulants.    I have spent atleast 20 minutes- face to face by video with patient today. More than 50 % of the time was spent for preparing to see the patient ( e.g., review of test, records ), obtaining and to review and separately obtained history , ordering medications and test ,psychoeducation and supportive psychotherapy and care coordination,as well as documenting clinical information in electronic health record. This note was generated in part or whole with voice recognition software. Voice recognition is usually quite accurate but there are transcription errors that can and very often do occur. I apologize for any typographical errors that were not detected and corrected.      Jomarie Longs, MD 03/06/2020, 8:30 AM

## 2020-03-08 ENCOUNTER — Other Ambulatory Visit: Payer: Self-pay | Admitting: Psychiatry

## 2020-03-08 DIAGNOSIS — F410 Panic disorder [episodic paroxysmal anxiety] without agoraphobia: Secondary | ICD-10-CM

## 2020-03-13 ENCOUNTER — Telehealth: Payer: Self-pay

## 2020-03-13 DIAGNOSIS — F900 Attention-deficit hyperactivity disorder, predominantly inattentive type: Secondary | ICD-10-CM

## 2020-03-13 MED ORDER — LISDEXAMFETAMINE DIMESYLATE 60 MG PO CAPS
60.0000 mg | ORAL_CAPSULE | ORAL | 0 refills | Status: DC
Start: 2020-03-13 — End: 2020-04-02

## 2020-03-13 NOTE — Telephone Encounter (Signed)
Reviewed blood pressure and heart rate done in office today.  Heart rate slightly elevated at 109.  Patient however has a history of elevated heart rate and has been cleared in the past by her cardiologist to stay on Vyvanse.  We will continue to monitor this and will have patient follow-up with cardiology as recommended.  As per cardiology notes in September 2021 patient will benefit from an echocardiogram in 6 months.  We will have discussion about this with patient during her next appointment.  Continue Vyvanse at the same dosage for now.  We will sent medication to pharmacy.

## 2020-03-13 NOTE — Telephone Encounter (Signed)
pt vitals were bp: 133/85 pulse: 109, and temp 98.0

## 2020-04-02 ENCOUNTER — Encounter: Payer: Self-pay | Admitting: Psychiatry

## 2020-04-02 ENCOUNTER — Other Ambulatory Visit: Payer: Self-pay

## 2020-04-02 ENCOUNTER — Telehealth (INDEPENDENT_AMBULATORY_CARE_PROVIDER_SITE_OTHER): Payer: Medicaid Other | Admitting: Psychiatry

## 2020-04-02 DIAGNOSIS — F900 Attention-deficit hyperactivity disorder, predominantly inattentive type: Secondary | ICD-10-CM

## 2020-04-02 DIAGNOSIS — F4310 Post-traumatic stress disorder, unspecified: Secondary | ICD-10-CM | POA: Diagnosis not present

## 2020-04-02 DIAGNOSIS — Z9111 Patient's noncompliance with dietary regimen: Secondary | ICD-10-CM

## 2020-04-02 DIAGNOSIS — F1390 Sedative, hypnotic, or anxiolytic use, unspecified, uncomplicated: Secondary | ICD-10-CM

## 2020-04-02 DIAGNOSIS — F314 Bipolar disorder, current episode depressed, severe, without psychotic features: Secondary | ICD-10-CM

## 2020-04-02 DIAGNOSIS — G479 Sleep disorder, unspecified: Secondary | ICD-10-CM

## 2020-04-02 DIAGNOSIS — Z91199 Patient's noncompliance with other medical treatment and regimen due to unspecified reason: Secondary | ICD-10-CM | POA: Insufficient documentation

## 2020-04-02 DIAGNOSIS — Z79899 Other long term (current) drug therapy: Secondary | ICD-10-CM

## 2020-04-02 DIAGNOSIS — F410 Panic disorder [episodic paroxysmal anxiety] without agoraphobia: Secondary | ICD-10-CM

## 2020-04-02 MED ORDER — LISDEXAMFETAMINE DIMESYLATE 60 MG PO CAPS: 60.0000 mg | ORAL_CAPSULE | ORAL | 0 refills | Status: AC

## 2020-04-02 MED ORDER — LURASIDONE HCL 80 MG PO TABS: 80.0000 mg | ORAL_TABLET | Freq: Every day | ORAL | 1 refills | Status: DC

## 2020-04-02 NOTE — Progress Notes (Signed)
Virtual Visit via Video Note  I connected with Denise Conrad on 04/02/20 at  1:00 PM EST by a video enabled telemedicine application and verified that I am speaking with the correct person using two identifiers.  Location Provider Location : [facility] Patient Location : Home  Participants: Patient , Provider    I discussed the limitations of evaluation and management by telemedicine and the availability of in person appointments. The patient expressed understanding and agreed to proceed.   I discussed the assessment and treatment plan with the patient. The patient was provided an opportunity to ask questions and all were answered. The patient agreed with the plan and demonstrated an understanding of the instructions.   The patient was advised to call back or seek an in-person evaluation if the symptoms worsen or if the condition fails to improve as anticipated.   BH MD OP Progress Note  04/02/2020 10:02 PM Denise Conrad  MRN:  [id number]  Chief Complaint:  Chief Complaint    ADD; Anxiety     HPI: Denise Conrad is a 30 year old Caucasian female, unemployed, has applied for disability, has a history of bipolar disorder, PTSD, ADHD, panic attacks, sleep problems, sinus tachycardia was evaluated by telemedicine today.  Patient reports she continues to be struggling with depressive symptoms.  She reports she has significant back pain when she tries to get up and do things and has not been able to do anything because of inability to stand for too long.  She reports she struggles with sadness, low energy, lack of motivation and excessive sleepiness.  This has not improved since her last visit.  Patient reports she has not been able to start the [facility] for excessive sleepiness prescribed by her sleep specialist since it was not approved by her health insurance plan.  Patient denies any manic episodes.  She reports she has been having panic attacks and has been overusing her  Klonopin.  She reports even though she is using it only sporadically she has been taking more than what is prescribed.  She reports even though it is prescribed to be taken 1 Conrad as needed for severe panic symptoms she took 5 tablets the last time she took it.  She reports she feels as though the klonopin low dose is not helping anymore with her anxiety symptoms and wants a dose increase.  Patient reports she is talking to a therapist over the phone.  She reports her therapist is located at [place].  She however reports she does not know how to get her medical records from this therapist and does not know the address.  She does have the phone number.   Patient continues to be compliant on the Vyvanse for ADHD symptoms.  Patient reports she believes the Vyvanse is helping.  She wants to stay on it.  Patient however with history of cardiac problems agrees to get in touch with cardiology for an echocardiogram and a reevaluation.  Patient denies any suicidality, homicidality.  Patient denies any perceptual disturbances.  Visit Diagnosis:    ICD-10-CM   1. Bipolar 1 disorder, depressed, severe (HCC)  F31.4   2. PTSD (post-traumatic stress disorder)  F43.10   3. Attention deficit hyperactivity disorder (ADHD), predominantly inattentive type  F90.0 Denise Conrad    lisdexamfetamine (VYVANSE) 60 MG capsule  4. Panic attacks  F41.0   5. 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  High risk medication use  Z79.899   8. Noncompliance with treatment plan  Z91.11     Past Psychiatric History: I have reviewed past psychiatric history from my progress note on 09/26/2019.  Past trials of lithium, Lamictal, Zyprexa, Cymbalta, Vyvanse, Wellbutrin, Abilify, Prozac, Lexapro, Paxil, Zoloft  Past Medical History:  Past Medical History:  Diagnosis Date  . Acute renal failure (HCC) 11/22/2014  . Acute suppurative tonsillitis 11/20/2014  . Anxiety   .  Bipolar 1 disorder (HCC)   . Depression   . Fibromyalgia   . 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 Sepsis (HCC) 11/20/2014    Past Surgical History:  Procedure Laterality Date  . IUD INSERTION  05/16/2019      . TEMPOROMANDIBULAR JOINT ARTHROPLASTY  2015  . TONSILLECTOMY    . WISDOM TOOTH EXTRACTION      Family Psychiatric History: I have reviewed family psychiatric history from my progress note on 09/26/2019  Family History:  Family History  Problem Relation Age of Onset  . Hashimoto's thyroiditis Mother   . Fibromyalgia Mother   . Hypertension Father   . Heart failure Father        hyptertensive cardiovascular disease  . Schizophrenia Father   . Suicidality Maternal Grandmother   . Bipolar disorder Paternal Grandmother     Social History: Reviewed social history from my progress note on 09/26/2019 Social History   Socioeconomic History  . Marital status: Single    Spouse name: Not on file  . Number of children: Not on file  . Years of education: Not on file  . Highest education level: Not on file  Occupational History  . Not on file  Tobacco Use  . Smoking status: Never Smoker  . Smokeless tobacco: Never Used  Vaping Use  . Vaping Use: Never used  Substance and Sexual Activity  . Alcohol use: No  . Drug use: No  . Sexual activity: Yes    Partners: Male  Other Topics Concern  . Not on file  Social History Narrative  . Not on file   Social Determinants of Health   Financial Resource Strain: Not on file  Food Insecurity: Not on file  Transportation Needs: Not on file  Physical Activity: Not on file  Stress: Not on file  Social Connections: Not on file    Allergies:  Allergies  Allergen Reactions  . Saphris [Asenapine] Shortness Of Breath and Other (See Comments)    Migraines, also  . Adhesive [Tape] Itching  . Sulfated Tallow Sodium Salt   . Topamax [Topiramate] Other (See Comments)    Gave her migraines that made her pass out  . Zofran  [Ondansetron] Hives  . Latex Rash  . Sulfa Antibiotics Rash    Metabolic Disorder Labs: Lab Results  Component Value Date   HGBA1C 5.6 08/21/2015   MPG 114 08/21/2015   Lab Results  Component Value Date   PROLACTIN 35.1 (H) 08/21/2015   PROLACTIN 68.9 (H) 08/20/2015   Lab Results  Component Value Date   CHOL 208 (H) 08/20/2015   TRIG 212 (H) 08/20/2015   HDL 58 08/20/2015   CHOLHDL 3.6 08/20/2015   VLDL 42 (H) 08/20/2015   LDLCALC 108 (H) 08/20/2015   Lab Results  Component Value Date   TSH 3.976 08/20/2015    Therapeutic Level Labs: No results found for: LITHIUM No results found for: VALPROATE No components found for:  CBMZ  Current Medications: Current Outpatient Medications  Medication Sig Dispense  Refill  . Denise Conrad Take 1 Conrad (80 mg total) by mouth daily with supper. 30 Conrad 1  . amoxicillin (AMOXIL) 875 MG Conrad Take 875 mg by mouth 2 (two) times daily. (Patient not taking: Reported on 03/05/2020)    . Armodafinil 150 MG Conrad Take by mouth. (Patient not taking: Reported on 03/05/2020)    . baclofen (LIORESAL) 10 MG Conrad TAKE 1 Conrad BY MOUTH THREE TIMES A DAY AS NEEDED    . BOTOX 200 units SOLR     . busPIRone (BUSPAR) 15 MG Conrad Take 1 Conrad (15 mg total) by mouth 2 (two) times daily. 60 Conrad 1  . calcium carbonate (TUMS - DOSED IN MG ELEMENTAL CALCIUM) 500 MG chewable Conrad Chew 1-2 tablets by mouth as needed for indigestion or heartburn.    . cetirizine (ZYRTEC) 10 MG Conrad Take 10 mg by mouth at bedtime.  12  . Cyanocobalamin (B-12) 1000 MCG CAPS Take 1,000 mcg by mouth daily.     . fesoterodine (TOVIAZ) 8 MG TB24 Conrad Take 8 mg by mouth at bedtime.    Marland Kitchen levonorgestrel (LILETTA, 52 MG,) 19.5 MCG/DAY IUD IUD 1 each by Intrauterine route once.    . lisdexamfetamine (VYVANSE) 60 MG capsule Take 1 capsule (60 mg total) by mouth every morning. Take daily at 8:30 AM 30 capsule 0  . [START ON 04/11/2020] lisdexamfetamine  (VYVANSE) 60 MG capsule Take 1 capsule (60 mg total) by mouth every morning. 30 capsule 0  . Denise (LATUDA) 40 MG TABS Conrad Take 1 Conrad (40 mg total) by mouth daily with breakfast. 30 Conrad 1  . Melatonin 10 MG TABS Take 20-40 mg by mouth at bedtime as needed (for sleep).     . pantoprazole (PROTONIX) 40 MG Conrad Take 40 mg by mouth 2 (two) times daily.    . pregabalin (LYRICA) 150 MG capsule Take 150 mg by mouth 2 (two) times daily.    Marland Kitchen venlafaxine XR (EFFEXOR-XR) 150 MG 24 hr capsule TAKE 1 CAPSULE BY MOUTH DAILY WITH BREAKFAST. 90 capsule 0   No current facility-administered medications for this visit.     Musculoskeletal: Strength & Muscle Tone: UTA Gait & Station: UTA Patient leans: N/A  Psychiatric Specialty Exam: Review of Systems  Constitutional: Positive for fatigue.  Psychiatric/Behavioral: Positive for dysphoric mood and sleep disturbance. The patient is nervous/anxious.   All other systems reviewed and are negative.   There were no vitals taken for this visit.There is no height or weight on file to calculate BMI.  General Appearance: Casual  Eye Contact:  Fair  Speech:  Clear and Coherent  Volume:  Normal  Mood:  Anxious and Depressed  Affect:  Congruent  Thought Process:  Goal Directed and Descriptions of Associations: Intact  Orientation:  Full (Time, Place, and Person)  Thought Content: Logical   Suicidal Thoughts:  No  Homicidal Thoughts:  No  Memory:  Immediate;   Fair Recent;   Fair Remote;   Fair  Judgement:  Fair  Insight:  Fair  Psychomotor Activity:  Normal  Concentration:  Concentration: Fair and Attention Span: Fair  Recall:  [employer] of Knowledge: Fair  Language: Fair  Akathisia:  No  Handed:  Right  AIMS (if indicated): UTA  Assets:  Communication Skills Desire for Improvement Housing Social Support  ADL's:  Intact  Cognition: WNL  Sleep:  Excessive   Screenings: AIMS   Flowsheet Row [facility]  AIMS Total Score 0    AUDIT   Flowsheet Row [facility]  Alcohol Use Disorder Identification Test Final Score (AUDIT) 0    PHQ2-9   Flowsheet Row Video Visit from 04/02/2020 in [facility]  PHQ-2 Total Score 5  PHQ-9 Total Score 14    Flowsheet Row Video Visit from 04/02/2020 in [facility]  C-SSRS RISK CATEGORY No Risk       Assessment and Plan: GARY BULTMAN is a 30 year old Caucasian female, applied for disability, lives in [place], has a history of bipolar disorder, ADD, sinus tachycardia, hypersomnia, obstructive sleep apnea was evaluated by telemedicine today.  Patient is biologically predisposed given her multiple health issues, history of trauma, family history of mental health problems.  Patient with psychosocial stressors of being single, current pandemic, financial problems, relationship struggles.  Patient continues to struggle with depressive symptoms, excessive sleepiness as well as anxiety.  Patient will benefit from the following plan.  Plan Bipolar disorder current episode depressed-unstable Increase Latuda to 120 mg p.o. daily in divided dosage with a meal.   PTSD-improving BuSpar 15 mg p.o. twice daily Venlafaxine as prescribed  Panic attacks/anxiety attacks-unstable Patient to have more intensive psychotherapy sessions. Venlafaxine 150 mg p.o. daily. BuSpar as prescribed Hydroxyzine 25 to 50 mg p.o. daily as needed for severe anxiety attacks  ADHD-stable Vyvanse 60 mg p.o. daily I have reviewed blood pressure and heart rate completed on 03/13/2020 at our clinic-BP-133/85 and pulse 109. Patient to call her cardiologist for a follow-up.  Sleep disorder-unstable Continue CPAP for OSA. Patient with hypersomnia will continue to follow-up with sleep specialist. Discussed with  patient that I do not recommend combining Nuvigil with Vyvanse due to her history of cardiac issues, tachycardia.  Patient reports Ronne Binning is not her approved by her health insurance plan yet.  Benzodiazepine misuse-unstable Patient was advised to take clonazepam 1 Conrad of 0.5 mg as needed for severe panic attacks a few times a month.  Patient was prescribed 10 pills.  Patient however today reports she has used 5 tablets at once when she had panic attack.  Discussed with patient that Klonopin cannot be continued since she has been overusing it.  Also discussed with her that she is on multiple other medications including muscle relaxants as well as Lyrica and I do not recommend mixing high dosages of Klonopin with these medications. Will discontinue Klonopin.  I do not recommend prescribing this anymore.  Noncompliance with treatment plan Patient was referred for ECT in the past.  Patient has been noncompliant.  Patient was advised to have more intensive psychotherapy sessions like an intensive outpatient program.  Patient has been noncompliant.  Also had discussed with patient to sign a release to obtain medical records from her therapist.Medical records pending.  Since patient is not making any progress in spite of making medication changes for the past several months I do not recommend virtual appointments for this patient anymore since it is limited.  I recommend patient to come into the office for an in person visit.  Patient however declines in person visits.  Patient agrees to follow-up with her primary care provider .  I have spent atleast 30  minutes with patient today which includes the time spent for preparing to see the patient ( e.g., review of test, records ), obtaining and to review and separately obtained history , ordering medications and test ,  psychoeducation and supportive psychotherapy and care coordination,as well as documenting clinical information in electronic health  record. This note was generated in part or whole with voice recognition software. Voice recognition is usually quite accurate but there are transcription errors that can and very often do occur. I apologize for any typographical errors that were not detected and corrected.      Jomarie Longs, MD 04/02/2020, 10:02 PM

## 2020-04-03 DIAGNOSIS — F319 Bipolar disorder, unspecified: Secondary | ICD-10-CM | POA: Insufficient documentation

## 2020-04-03 DIAGNOSIS — G43909 Migraine, unspecified, not intractable, without status migrainosus: Secondary | ICD-10-CM | POA: Insufficient documentation

## 2020-04-03 DIAGNOSIS — G479 Sleep disorder, unspecified: Secondary | ICD-10-CM | POA: Insufficient documentation

## 2020-04-23 ENCOUNTER — Other Ambulatory Visit: Payer: Self-pay | Admitting: Psychiatry

## 2020-04-23 DIAGNOSIS — F4310 Post-traumatic stress disorder, unspecified: Secondary | ICD-10-CM

## 2020-05-09 ENCOUNTER — Other Ambulatory Visit: Payer: Self-pay | Admitting: Psychiatry

## 2020-05-09 DIAGNOSIS — F410 Panic disorder [episodic paroxysmal anxiety] without agoraphobia: Secondary | ICD-10-CM

## 2020-05-13 ENCOUNTER — Telehealth: Payer: Self-pay

## 2020-05-13 DIAGNOSIS — F4310 Post-traumatic stress disorder, unspecified: Secondary | ICD-10-CM

## 2020-05-13 DIAGNOSIS — F314 Bipolar disorder, current episode depressed, severe, without psychotic features: Secondary | ICD-10-CM

## 2020-05-13 DIAGNOSIS — F900 Attention-deficit hyperactivity disorder, predominantly inattentive type: Secondary | ICD-10-CM

## 2020-05-13 DIAGNOSIS — F410 Panic disorder [episodic paroxysmal anxiety] without agoraphobia: Secondary | ICD-10-CM

## 2020-05-13 MED ORDER — LURASIDONE HCL 40 MG PO TABS: 40.0000 mg | ORAL_TABLET | Freq: Every day | ORAL | 1 refills | Status: DC

## 2020-05-13 MED ORDER — LURASIDONE HCL 80 MG PO TABS: 80.0000 mg | ORAL_TABLET | Freq: Every day | ORAL | 1 refills | Status: DC

## 2020-05-13 MED ORDER — VENLAFAXINE HCL ER 150 MG PO CP24: 150.0000 mg | ORAL_CAPSULE | Freq: Every day | ORAL | 1 refills | Status: DC

## 2020-05-13 MED ORDER — BUSPIRONE HCL 15 MG PO TABS: 15.0000 mg | ORAL_TABLET | Freq: Two times a day (BID) | ORAL | 1 refills | Status: DC

## 2020-05-13 NOTE — Telephone Encounter (Signed)
pt called she states that she has an appt for may 20th with another provider. but she needs enough medications to get to that may 20th appt.

## 2020-05-13 NOTE — Telephone Encounter (Signed)
I have sent all her prescriptions except for Vyvanse.  Patient with elevated heart rate , tachycardia recently, will need blood pressure and heart rate evaluation prior to future refills for Vyvanse.  She can come into the office to get it done here and further refills will be sent to get her through May 20.  If she is unable to do that she could go to her primary care provider to get them to get her blood pressure and heart rate reading and then have them fax it to [place].  We will have Shanda Bumps CMA contact Patient to advise.

## 2020-05-15 NOTE — Telephone Encounter (Signed)
spoke with patient and she stated that she has moved and ll[place] away  and she can not drive back here to get the bp and heart rate check. she stated she only needs a month supply that she already has a new psych but she can not see her until may 20th

## 2020-05-15 NOTE — Telephone Encounter (Signed)
Patient with history of heart rate elevation, needs it to be monitored since she is on multiple medications including Vyvanse. She will need to get blood pressure and heart rate evaluated by her provider close to her and send documentation to [place] or she will need to come here for an evaluation prior to future refills.

## 2020-05-17 MED ORDER — LISDEXAMFETAMINE DIMESYLATE 60 MG PO CAPS: 60.0000 mg | ORAL_CAPSULE | ORAL | 0 refills | Status: DC

## 2020-05-17 NOTE — Telephone Encounter (Signed)
Correction-patient has new provider appointment on May 20

## 2020-05-17 NOTE — Telephone Encounter (Signed)
I have reviewed most recent blood pressure reading dated 05/01/2020-130/92, pulse rate-102.-This was sent to [place] by person primary care at [facility]-Dr. Vevelyn Royals at [facility].  I have reviewed [place] PMP aware.  Patient has upcoming appointment with a new provider on May 28.  Will send a short supply of Vyvanse to last until then.  No future refills will be provided.

## 2020-07-04 DIAGNOSIS — I517 Cardiomegaly: Secondary | ICD-10-CM | POA: Insufficient documentation

## 2020-07-19 ENCOUNTER — Other Ambulatory Visit: Payer: Self-pay | Admitting: Psychiatry

## 2020-07-19 DIAGNOSIS — F900 Attention-deficit hyperactivity disorder, predominantly inattentive type: Secondary | ICD-10-CM

## 2020-08-15 ENCOUNTER — Other Ambulatory Visit: Payer: Self-pay | Admitting: Psychiatry

## 2020-08-15 DIAGNOSIS — F314 Bipolar disorder, current episode depressed, severe, without psychotic features: Secondary | ICD-10-CM

## 2020-08-27 DIAGNOSIS — I10 Essential (primary) hypertension: Secondary | ICD-10-CM | POA: Insufficient documentation

## 2020-08-27 HISTORY — DX: Essential (primary) hypertension: I10

## 2020-09-03 ENCOUNTER — Other Ambulatory Visit: Payer: Self-pay | Admitting: Psychiatry

## 2020-09-03 DIAGNOSIS — F4310 Post-traumatic stress disorder, unspecified: Secondary | ICD-10-CM

## 2020-11-04 NOTE — Progress Notes (Signed)
Sleep Medicine   Office Visit  Patient Name: Denise Conrad DOB: 10-10-1990 MRN [id number]    Chief Complaint: OSA and sleepiness  Brief History:  Denise Conrad is here today for initial consult  for CPAP and Nuvigil.  This patient has a 6 year history of sleep apnea. Sleep quality is fair. Pt reports she sleeps 15-16 hrs/day. This is noted most nights. The patient's bed partner reports  snoring, choking and gasping for air at night. The patient relates the following symptoms: choking, gasping, and snoring are also present. The patient goes to sleep at 10 pm and wakes up at 6-7 am to get her son ready for school. She goes back to sleep and wakes up at 1 pm.  she reports that her sleep quality is fair.  Sleep quality is the same when outside home environment.  Patient has noted no restlessness of her legs at night.    The patient admits a history of psychiatric problems. The Epworth Sleepiness Score is 12 out of 24 .  The patient relates  Cardiovascular risk factors include: HTN. Pt reports headaches in the afternoon and will take a nap. Pt reports she does not drink caffeine due to Chronic Migraines. Pt reports she is able to focus and concentrate during the day. Pt reports moderate memory issues. Pt reports that she has nightmares and moves significantly in her sleep. Pt reports she feels "creepy/crawly" sensations on her legs frequently, but states it doesn't impact sleep. Pt reports she has been taking Nuvigil for 3 months and is not effective. Pt currently uses CPAP machine every night and reports that she is benefiting from it. Pt will bring her unit in to office next week for a compliance download via card. 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 Recent PSG/MSLT done in the last year that was negative for narcolepsy. Will request studies.   ROS  General: (-) fever, (-) chills, (-) night sweat Nose and Sinuses: (-) nasal stuffiness or itchiness, (-)  postnasal drip, (-) nosebleeds, (-) sinus trouble. Mouth and Throat: (-) sore throat, (-) hoarseness. Neck: (-) swollen glands, (-) enlarged thyroid, (-) neck pain. Respiratory: - cough, - shortness of breath, - wheezing. Neurologic: - numbness, - tingling. Psychiatric: + anxiety, + depression Sleep behavior: -sleep paralysis +hypnogogic hallucinations -dream enactment      +vivid dreams -cataplexy +night terrors -sleep walking   Current Medication: Outpatient Encounter Medications as of 11/05/2020  Medication Sig   clonazePAM (KLONOPIN) 0.5 MG tablet clonazepam 0.5 mg tablet   modafinil (PROVIGIL) 100 MG tablet Take 1 tab by mouth daily for 1 week. Then may increase to 2 tabs (200mg ) daily by mouth for EDS.   amLODipine (NORVASC) 5 MG tablet amlodipine 5 mg tablet  TAKE 1 TABLET BY MOUTH EVERY DAY   amoxicillin (AMOXIL) 875 MG tablet Take 875 mg by mouth 2 (two) times daily. (Patient not taking: Reported on 03/05/2020)   ARIPiprazole (ABILIFY) 15 MG tablet aripiprazole 15 mg tablet   baclofen (LIORESAL) 10 MG tablet TAKE 1 TABLET BY MOUTH THREE TIMES A DAY AS NEEDED   BOTOX 200 units SOLR    calcium carbonate (TUMS - DOSED IN MG ELEMENTAL CALCIUM) 500 MG chewable tablet Chew 1-2 tablets by mouth as needed for indigestion or heartburn.   cetirizine (ZYRTEC) 10 MG tablet Take 10 mg by mouth at bedtime.   Cyanocobalamin (B-12) 1000 MCG CAPS Take 1,000 mcg by mouth daily.    fesoterodine (TOVIAZ) 8  MG TB24 tablet Take 8 mg by mouth at bedtime.   levonorgestrel (LILETTA, 52 MG,) 19.5 MCG/DAY IUD IUD 1 each by Intrauterine route once.   lisdexamfetamine (VYVANSE) 60 MG capsule Take 1 capsule (60 mg total) by mouth every morning.   lisdexamfetamine (VYVANSE) 60 MG capsule Take 1 capsule (60 mg total) by mouth every morning. Take daily at 8:30 AM   lurasidone (LATUDA) 40 MG TABS tablet Take 1 tablet (40 mg total) by mouth daily with breakfast.   lurasidone (LATUDA) 80 MG TABS tablet Take 1 tablet  (80 mg total) by mouth daily with supper.   Melatonin 10 MG TABS Take 20-40 mg by mouth at bedtime as needed (for sleep).    metFORMIN (GLUCOPHAGE) 500 MG tablet Take by mouth.   pantoprazole (PROTONIX) 40 MG tablet Take 40 mg by mouth 2 (two) times daily.   pregabalin (LYRICA) 150 MG capsule Take 150 mg by mouth 2 (two) times daily.   venlafaxine XR (EFFEXOR-XR) 150 MG 24 hr capsule Take 1 capsule (150 mg total) by mouth daily with breakfast.   [DISCONTINUED] Armodafinil 150 MG tablet Take by mouth. (Patient not taking: Reported on 03/05/2020)   [DISCONTINUED] busPIRone (BUSPAR) 15 MG tablet Take 1 tablet (15 mg total) by mouth 2 (two) times daily.   No facility-administered encounter medications on file as of 11/05/2020.    Surgical History: Past Surgical History:  Procedure Laterality Date   IUD INSERTION  05/16/2019       TEMPOROMANDIBULAR JOINT ARTHROPLASTY  2015   TONSILLECTOMY     WISDOM TOOTH EXTRACTION      Medical History: Past Medical History:  Diagnosis Date   Acute renal failure (HCC) 11/22/2014   Acute suppurative tonsillitis 11/20/2014   Anxiety    Bipolar 1 disorder (HCC)    Depression    Fibromyalgia    Hypertensive disorder 08/27/2020   Liver disease    tumor on liver   Sepsis (HCC) 11/20/2014    Family History: Non contributory to the present illness  Social History: Social History   Socioeconomic History   Marital status: Single    Spouse name: Not on file   Number of children: Not on file   Years of education: Not on file   Highest education level: Not on file  Occupational History   Not on file  Tobacco Use   Smoking status: Never   Smokeless tobacco: Never  Vaping Use   Vaping Use: Never used  Substance and Sexual Activity   Alcohol use: No   Drug use: No   Sexual activity: Yes    Partners: Male  Other Topics Concern   Not on file  Social History Narrative   Not on file   Social Determinants of Health   Financial Resource Strain:  Not on file  Food Insecurity: Not on file  Transportation Needs: Not on file  Physical Activity: Not on file  Stress: Not on file  Social Connections: Not on file  Intimate Partner Violence: Not on file    Vital Signs: Blood pressure 114/83, pulse (!) 112, resp. rate 18, height 5\' 6"  (1.676 m), weight (!) 348 lb (157.9 kg), SpO2 94 %.  Examination: General Appearance: The patient is well-developed, well-nourished, and in no distress. Neck Circumference: 45 cm Skin: Gross inspection of skin unremarkable. Head: normocephalic, no gross deformities. Eyes: no gross deformities noted. ENT: ears appear grossly normal Neurologic: Alert and oriented. No involuntary movements.    EPWORTH SLEEPINESS SCALE:  Scale:  (0)=  no chance of dozing; (1)= slight chance of dozing; (2)= moderate chance of dozing; (3)= high chance of dozing  Chance  Situtation    Sitting and reading: 3    Watching TV: 1    Sitting Inactive in public: 0    As a passenger in car: 2      Lying down to rest: 3    Sitting and talking: 1    Sitting quielty after lunch: 2    In a car, stopped in traffic: 0   TOTAL SCORE:   12 out of 24    SLEEP STUDIES:  None at this time.   LABS: No results found for this or any previous visit (from the past 2160 hour(s)).  Radiology: No results found.  No results found.  No results found.    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   Noncompliance with treatment plan 04/02/2020   High risk medication use 11/02/2019   Bipolar 1 disorder, mixed, moderate (HCC) 09/26/2019   PTSD (post-traumatic stress disorder) 09/26/2019   Attention deficit hyperactivity disorder (ADHD), predominantly inattentive type  09/26/2019   Sleep disorder 09/26/2019   Panic attacks 09/26/2019   Hepatic steatosis 05/26/2019   Vitamin D deficiency 12/05/2018   Gastro-esophageal reflux disease without esophagitis 02/18/2018   IFG (impaired fasting glucose) 05/30/2017   Acne vulgaris 05/28/2017   Chronic nasopharyngitis 03/05/2017   Lumbosacral spondylosis without myelopathy 08/20/2016   Chlamydia infection 07/31/2016   Pap smear of cervix shows low risk HPV present 07/31/2016   Obstructive sleep apnea syndrome, moderate 04/28/2016   Iron deficiency 04/20/2016   Fibromyalgia 03/31/2016   Chronic bilateral low back pain without sciatica 02/21/2016   Family history of Hashimoto thyroiditis 02/21/2016   Morbid obesity with BMI of 50.0-59.9, adult (HCC) 02/21/2016   Scoliosis 02/21/2016   Bipolar 1 disorder, depressed, severe (HCC) 08/19/2015   Anxiety 11/20/2014   Arthropathy of lumbar facet joint 05/24/2013     PLAN OSA:  Pt will bring machine for card download but states she uses nightly and benefits from it, but still sleepy. Nuvigil is not working for her despite increased dose by previous provider. Will try switching to provigil beginning at 100mg  for 1 week then increasing to 200mg . Previously tried sunosi without success. Will f/u in 1 month. Patient is already on vyvance 60mg  by psych provider.  1. OSA (obstructive sleep apnea) Use nightly  2. CPAP use counseling CPAP couseling-Discussed importance of adequate CPAP use as well as proper care and cleaning techniques of machine and all supplies.  3. Idiopathic hypersomnia Will try switching to provigil at 100mg  x1 week then increase to 200mg  if needed after that. Educated to not combine with nuvigil. - modafinil (PROVIGIL) 100 MG tablet; Take 1 tab by mouth daily for 1 week. Then may increase to 2 tabs (200mg ) daily by mouth for EDS.  Dispense: 60 tablet; Refill: 1  4. Bipolar 1 disorder, depressed, severe (HCC) Followed by pysch  5.  Anxiety Followed by psych  6. Morbid obesity with BMI of 50.0-59.9, adult (HCC) Obesity Counseling: Had a lengthy discussion regarding patients BMI and weight issues. Patient was instructed on portion control as well as increased activity. Also discussed caloric restrictions with trying to maintain intake less than 2000 Kcal. Discussions  were made in accordance with the 5As of weight management. Simple actions such as not eating late and if able to, taking a walk is suggested.    General Counseling: I have discussed the findings of the evaluation and examination with Denise Conrad.  I have also discussed any further diagnostic evaluation thatmay be needed or ordered today. Denise Conrad verbalizes understanding of the findings of todays visit. We also reviewed her medications today and discussed drug interactions and side effects including but not limited excessive drowsiness and altered mental states. We also discussed that there is always a risk not just to her but also people around her. she has been encouraged to call the office with any questions or concerns that should arise related to todays visit.  No orders of the defined types were placed in this encounter.       I have personally obtained a history, evaluated the patient, evaluated pertinent data, formulated the assessment and plan and placed orders.  This patient was seen by Lynn Ito, PA-C in collaboration with Dr. Freda Munro as a part of collaborative care agreement.   Valentino Hue Sol Blazing, PhD, FAASM  Diplomate, American Board of Sleep Medicine    Yevonne Pax, MD [facility] Diplomate ABMS Pulmonary and Critical Care Medicine Sleep medicine

## 2020-11-05 ENCOUNTER — Other Ambulatory Visit: Payer: Self-pay

## 2020-11-05 ENCOUNTER — Ambulatory Visit (INDEPENDENT_AMBULATORY_CARE_PROVIDER_SITE_OTHER): Payer: Medicaid Other | Admitting: Internal Medicine

## 2020-11-05 VITALS — BP 114/83 | HR 112 | Resp 18 | Ht 66.0 in | Wt 348.0 lb

## 2020-11-05 DIAGNOSIS — Z6841 Body Mass Index (BMI) 40.0 and over, adult: Secondary | ICD-10-CM

## 2020-11-05 DIAGNOSIS — Z7189 Other specified counseling: Secondary | ICD-10-CM | POA: Diagnosis not present

## 2020-11-05 DIAGNOSIS — G4733 Obstructive sleep apnea (adult) (pediatric): Secondary | ICD-10-CM | POA: Diagnosis not present

## 2020-11-05 DIAGNOSIS — F314 Bipolar disorder, current episode depressed, severe, without psychotic features: Secondary | ICD-10-CM

## 2020-11-05 DIAGNOSIS — G4711 Idiopathic hypersomnia with long sleep time: Secondary | ICD-10-CM | POA: Diagnosis not present

## 2020-11-05 DIAGNOSIS — F419 Anxiety disorder, unspecified: Secondary | ICD-10-CM

## 2020-11-05 MED ORDER — MODAFINIL 100 MG PO TABS: ORAL_TABLET | ORAL | 1 refills | Status: DC

## 2020-11-05 NOTE — Patient Instructions (Signed)
Hypersomnia Hypersomnia is a condition in which a person feels very tired during the day even though he or she gets plenty of sleep at night. A person with this condition may take naps during the day and may find it very difficult to wake up from sleep. Hypersomnia may affect a person's ability to think, concentrate, drive, or remember things. What are the causes? The cause of this condition may not be known. Possible causes include: Certain medicines. Sleep disorders, such as narcolepsy and sleep apnea. Injury to the head, brain, or spinal cord. Drug or alcohol use. Gastroesophageal reflux disease (GERD). Tumors. Certain medical conditions, such as depression, diabetes, or an underactive thyroid gland (hypothyroidism). What are the signs or symptoms? The main symptoms of hypersomnia include: Feeling very tired throughout the day, regardless of how much sleep you got the night before. Having trouble waking up. Others may find it difficult to wake you up when you are sleeping. Sleeping for longer and longer periods at a time. Taking naps throughout the day. Other symptoms may include: Feeling restless, anxious, or annoyed. Lacking energy. Having trouble with: Remembering. Speaking. Thinking. Loss of appetite. Seeing, hearing, tasting, smelling, or feeling things that are not real (hallucinations). How is this diagnosed? This condition may be diagnosed based on: Your symptoms and medical history. Your sleeping habits. Your health care provider may ask you to write down your sleeping habits in a daily sleep log, along with any symptoms you have. A series of tests that are done while you sleep (sleep study or polysomnogram). A test that measures how quickly you can fall asleep during the day (daytime nap study or multiple sleep latency test). How is this treated? Treatment can help you manage your condition. Treatment may include: Following a regular sleep routine. Lifestyle changes,  such as changing your eating habits, getting regular exercise, and avoiding alcohol or caffeinated beverages. Taking medicines to make you more alert (stimulants) during the day. Treating any underlying medical causes of hypersomnia. Follow these instructions at home: Sleep routine  Schedule the same bedtime and wake-up time each day. Practice a relaxing bedtime routine. This may include reading, meditation, deep breathing, or taking a warm bath before going to sleep. Get regular exercise each day. Avoid strenuous exercise in the evening hours. Keep your sleep environment at a cooler temperature, darkened, and quiet. Sleep with pillows and a mattress that are comfortable and supportive. Schedule short 20-minute naps for when you feel sleepiest during the day. Talk with your employer or teachers about your hypersomnia. If possible, adjust your schedule so that: You have a regular daytime work schedule. You can take a scheduled nap during the day. You do not have to work or be active at night. Do not eat a heavy meal for a few hours before bedtime. Eat your meals at about the same times every day. Avoid drinking alcohol or caffeinated beverages. Safety  Do not drive or use heavy machinery if you are sleepy. Ask your health care provider if it is safe for you to drive. Wear a life jacket when swimming or spending time near water. General instructions Take supplements and over-the-counter and prescription medicines only as told by your health care provider. Keep a sleep log that will help your doctor manage your condition. This may include information about: What time you go to bed each night. How often you wake up at night. How many hours you sleep at night. How often and for how long you nap during the day.   Any observations from others, such as leg movements during sleep, sleep walking, or snoring. Keep all follow-up visits as told by your health care provider. This is important. Contact  a health care provider if: You have new symptoms. Your symptoms get worse. Get help right away if: You have serious thoughts about hurting yourself or someone else. If you ever feel like you may hurt yourself or others, or have thoughts about taking your own life, get help right away. You can go to your nearest emergency department or call: Your local emergency services (911 in the [place] A suicide crisis helpline, such as the National Suicide Prevention Lifeline at (662)110-4305. This is open 24 hours a day. Summary Hypersomnia refers to a condition in which you feel very tired during the day even though you get plenty of sleep at night. A person with this condition may take naps during the day and may find it very difficult to wake up from sleep. Hypersomnia may affect a person's ability to think, concentrate, drive, or remember things. Treatment, such as following a regular sleep routine and making some lifestyle changes, can help you manage your condition. This information is not intended to replace advice given to you by your health care provider. Make sure you discuss any questions you have with your health care provider. Document Revised: 11/23/2019 Document Reviewed: 11/23/2019 Elsevier Patient Education  2022 [employer] CPAP and BPAP Information CPAP and BPAP (also called BiPAP) are methods that use air pressure to keep your airways open and to help you breathe well. CPAP and BPAP use different amounts of pressure. Your health care provider will tell you whether CPAP or BPAP would be more helpful for you. CPAP stands for "continuous positive airway pressure." With CPAP, the amount of pressure stays the same while you breathe in (inhale) and out (exhale). BPAP stands for "bi-level positive airway pressure." With BPAP, the amount of pressure will be higher when you inhale and lower when you exhale. This allows you to take larger breaths. CPAP or BPAP may be used in the hospital, or  your health care provider may want you to use it at home. You may need to have a sleep study before your health care provider can order a machine for you to use at home. What are the advantages? CPAP or BPAP can be helpful if you have: Sleep apnea. Chronic obstructive pulmonary disease (COPD). Heart failure. Medical conditions that cause muscle weakness, including muscular dystrophy or amyotrophic lateral sclerosis (ALS). Other problems that cause breathing to be shallow, weak, abnormal, or difficult. CPAP and BPAP are most commonly used for obstructive sleep apnea (OSA) to keep the airways from collapsing when the muscles relax during sleep. What are the risks? Generally, this is a safe treatment. However, problems may occur, including: Irritated skin or skin sores if the mask does not fit properly. Dry or stuffy nose or nosebleeds. Dry mouth. Feeling gassy or bloated. Sinus or lung infection if the equipment is not cleaned properly. When should CPAP or BPAP be used? In most cases, the mask only needs to be worn during sleep. Generally, the mask needs to be worn throughout the night and during any daytime naps. People with certain medical conditions may also need to wear the mask at other times, such as when they are awake. Follow instructions from your health care provider about when to use the machine. What happens during CPAP or BPAP? Both CPAP and BPAP are provided by a small machine with a  flexible plastic tube that attaches to a plastic mask that you wear. Air is blown through the mask into your nose or mouth. The amount of pressure that is used to blow the air can be adjusted on the machine. Your health care provider will set the pressure setting and help you find the best mask for you. Tips for using the mask Because the mask needs to be snug, some people feel trapped or closed-in (claustrophobic) when first using the mask. If you feel this way, you may need to get used to the mask. One  way to do this is to hold the mask loosely over your nose or mouth and then gradually apply the mask more snugly. You can also gradually increase the amount of time that you use the mask. Masks are available in various types and sizes. If your mask does not fit well, talk with your health care provider about getting a different one. Some common types of masks include: Full face masks, which fit over the mouth and nose. Nasal masks, which fit over the nose. Nasal pillow or prong masks, which fit into the nostrils. If you are using a mask that fits over your nose and you tend to breathe through your mouth, a chin strap may be applied to help keep your mouth closed. Use a skin barrier to protect your skin as told by your health care provider. Some CPAP and BPAP machines have alarms that may sound if the mask comes off or develops a leak. If you have trouble with the mask, it is very important that you talk with your health care provider about finding a way to make the mask easier to tolerate. Do not stop using the mask. There could be a negative impact on your health if you stop using the mask. Tips for using the machine Place your CPAP or BPAP machine on a secure table or stand near an electrical outlet. Know where the on/off switch is on the machine. Follow instructions from your health care provider about how to set the pressure on your machine and when you should use it. Do not eat or drink while the CPAP or BPAP machine is on. Food or fluids could get pushed into your lungs by the pressure of the CPAP or BPAP. For home use, CPAP and BPAP machines can be rented or purchased through home health care companies. Many different brands of machines are available. Renting a machine before purchasing may help you find out which particular machine works well for you. Your health insurance company may also decide which machine you may get. Keep the CPAP or BPAP machine and attachments clean. Ask your health  care provider for specific instructions. Check the humidifier if you have a dry stuffy nose or nosebleeds. Make sure it is working correctly. Follow these instructions at home: Take over-the-counter and prescription medicines only as told by your health care provider. Ask if you can take sinus medicine if your sinuses are blocked. Do not use any products that contain nicotine or tobacco. These products include cigarettes, chewing tobacco, and vaping devices, such as e-cigarettes. If you need help quitting, ask your health care provider. Keep all follow-up visits. This is important. Contact a health care provider if: You have redness or pressure sores on your head, face, mouth, or nose from the mask or head gear. You have trouble using the CPAP or BPAP machine. You cannot tolerate wearing the CPAP or BPAP mask. Someone tells you that you snore even  when wearing your CPAP or BPAP. Get help right away if: You have trouble breathing. You feel confused. Summary CPAP and BPAP are methods that use air pressure to keep your airways open and to help you breathe well. If you have trouble with the mask, it is very important that you talk with your health care provider about finding a way to make the mask easier to tolerate. Do not stop using the mask. There could be a negative impact to your health if you stop using the mask. Follow instructions from your health care provider about when to use the machine. This information is not intended to replace advice given to you by your health care provider. Make sure you discuss any questions you have with your health care provider. Document Revised: 12/22/2019 Document Reviewed: 12/22/2019 Elsevier Patient Education  2022 [employer].

## 2020-12-09 NOTE — Progress Notes (Signed)
Pt rescehduled

## 2020-12-10 ENCOUNTER — Other Ambulatory Visit: Payer: Self-pay

## 2020-12-10 ENCOUNTER — Ambulatory Visit: Payer: Medicaid Other | Admitting: Internal Medicine

## 2021-01-13 NOTE — Progress Notes (Signed)
error 

## 2021-01-15 ENCOUNTER — Ambulatory Visit (INDEPENDENT_AMBULATORY_CARE_PROVIDER_SITE_OTHER): Payer: Medicaid Other | Admitting: Internal Medicine

## 2021-01-15 VITALS — BP 130/92 | HR 105 | Resp 20 | Ht 66.0 in | Wt 341.0 lb

## 2021-01-15 DIAGNOSIS — Z6841 Body Mass Index (BMI) 40.0 and over, adult: Secondary | ICD-10-CM

## 2021-01-15 DIAGNOSIS — G4733 Obstructive sleep apnea (adult) (pediatric): Secondary | ICD-10-CM | POA: Diagnosis not present

## 2021-01-15 DIAGNOSIS — F419 Anxiety disorder, unspecified: Secondary | ICD-10-CM

## 2021-01-15 DIAGNOSIS — Z7189 Other specified counseling: Secondary | ICD-10-CM

## 2021-01-15 DIAGNOSIS — F314 Bipolar disorder, current episode depressed, severe, without psychotic features: Secondary | ICD-10-CM

## 2021-01-15 DIAGNOSIS — G4711 Idiopathic hypersomnia with long sleep time: Secondary | ICD-10-CM | POA: Diagnosis not present

## 2021-01-15 MED ORDER — MODAFINIL 100 MG PO TABS: ORAL_TABLET | ORAL | 1 refills | Status: DC

## 2021-01-15 NOTE — Progress Notes (Signed)
Sleep Medicine   Office Visit  Patient Name: Denise Conrad DOB: [birth year] MRN [id number]    Chief Complaint: OSA and Idiopathic hypersomnia follow up   HISTORY OF PRESENT ILLNESS: Denise Conrad is seen today for follow up for CPAP@ 11 cmH2O and medications. The patient has previously tried sunosi and Nuvigil however these did not help her daytime sleepiness. Last visit she was prescribed provigil however patient was unable to get this due to an issue with her insurance requiring some authorization. Unfortunately an authorization was not received and states it may have been a problem with more than once per day dosing. Will resend with 100mg  once per day for 30days and see how she does with this. She will follow up if any issues receiving it this time. May need to increase to 200mg  in future. The patient continues to use her CPAP nightly and is benefiting from use however she continues to have daytime sleepiness. The Epworth Sleepiness Score is 10 out of 24 . We did also discuss that some of her sleepiness could be due to medication side effects from some of her other medications and may need to discuss this with psych provider if still not improving.  ROS  General: (-) fever, (-) chills, (-) night sweat Nose and Sinuses: (-) nasal stuffiness or itchiness, (-) postnasal drip, (-) nosebleeds, (-) sinus trouble. Mouth and Throat: (-) sore throat, (-) hoarseness. Neck: (-) swollen glands, (-) enlarged thyroid, (-) neck pain. Respiratory: - cough, - shortness of breath, - wheezing. Neurologic: - numbness, - tingling. Psychiatric: + anxiety, + depression   Current Medication: Outpatient Encounter Medications as of 01/15/2021  Medication Sig   amLODipine (NORVASC) 5 MG tablet amlodipine 5 mg tablet  TAKE 1 TABLET BY MOUTH EVERY DAY   amoxicillin (AMOXIL) 875 MG tablet Take 875 mg by mouth 2 (two) times daily. (Patient not taking: Reported on 03/05/2020)   ARIPiprazole (ABILIFY) 15 MG tablet  aripiprazole 15 mg tablet   baclofen (LIORESAL) 10 MG tablet TAKE 1 TABLET BY MOUTH THREE TIMES A DAY AS NEEDED   BOTOX 200 units SOLR    calcium carbonate (TUMS - DOSED IN MG ELEMENTAL CALCIUM) 500 MG chewable tablet Chew 1-2 tablets by mouth as needed for indigestion or heartburn.   cetirizine (ZYRTEC) 10 MG tablet Take 10 mg by mouth at bedtime.   clonazePAM (KLONOPIN) 0.5 MG tablet clonazepam 0.5 mg tablet   Cyanocobalamin (B-12) 1000 MCG CAPS Take 1,000 mcg by mouth daily.    fesoterodine (TOVIAZ) 8 MG TB24 tablet Take 8 mg by mouth at bedtime.   levonorgestrel (LILETTA, 52 MG,) 19.5 MCG/DAY IUD IUD 1 each by Intrauterine route once.   lisdexamfetamine (VYVANSE) 60 MG capsule Take 1 capsule (60 mg total) by mouth every morning.   lisdexamfetamine (VYVANSE) 60 MG capsule Take 1 capsule (60 mg total) by mouth every morning. Take daily at 8:30 AM   lurasidone (LATUDA) 40 MG TABS tablet Take 1 tablet (40 mg total) by mouth daily with breakfast.   lurasidone (LATUDA) 80 MG TABS tablet Take 1 tablet (80 mg total) by mouth daily with supper.   Melatonin 10 MG TABS Take 20-40 mg by mouth at bedtime as needed (for sleep).    metFORMIN (GLUCOPHAGE) 500 MG tablet Take by mouth.   modafinil (PROVIGIL) 100 MG tablet Take 1 tab by mouth daily for EDS   MOUNJARO 2.5 MG/0.5ML Pen Inject into the skin.   pantoprazole (PROTONIX) 40 MG tablet Take 40 mg by mouth 2 (  two) times daily.   pregabalin (LYRICA) 150 MG capsule Take 150 mg by mouth 2 (two) times daily.   venlafaxine XR (EFFEXOR-XR) 150 MG 24 hr capsule Take 1 capsule (150 mg total) by mouth daily with breakfast.   [DISCONTINUED] modafinil (PROVIGIL) 100 MG tablet Take 1 tab by mouth daily for 1 week. Then may increase to 2 tabs (200mg ) daily by mouth for EDS.   No facility-administered encounter medications on file as of 01/15/2021.    Surgical History: Past Surgical History:  Procedure Laterality Date   IUD INSERTION  05/16/2019        TEMPOROMANDIBULAR JOINT ARTHROPLASTY  2015   TONSILLECTOMY     WISDOM TOOTH EXTRACTION      Medical History: Past Medical History:  Diagnosis Date   Acute renal failure (HCC) 11/22/2014   Acute suppurative tonsillitis 11/20/2014   Anxiety    Bipolar 1 disorder (HCC)    Depression    Fibromyalgia    Hypertensive disorder 08/27/2020   Liver disease    tumor on liver   Sepsis (HCC) 11/20/2014    Family History: Non contributory to the present illness  Social History: Social History   Socioeconomic History   Marital status: Single    Spouse name: Not on file   Number of children: Not on file   Years of education: Not on file   Highest education level: Not on file  Occupational History   Not on file  Tobacco Use   Smoking status: Never   Smokeless tobacco: Never  Vaping Use   Vaping Use: Never used  Substance and Sexual Activity   Alcohol use: No   Drug use: No   Sexual activity: Yes    Partners: Male  Other Topics Concern   Not on file  Social History Narrative   Not on file   Social Determinants of Health   Financial Resource Strain: Not on file  Food Insecurity: Not on file  Transportation Needs: Not on file  Physical Activity: Not on file  Stress: Not on file  Social Connections: Not on file  Intimate Partner Violence: Not on file    Vital Signs: Blood pressure (!) 130/92, pulse (!) 105, resp. rate 20, height 5\' 6"  (1.676 m), weight (!) 341 lb (154.7 kg), SpO2 96 %. Body mass index is 55.04 kg/m.   Examination: General Appearance: The patient is well-developed, well-nourished, and in no distress. Neck Circumference: 51 cm Skin: Gross inspection of skin unremarkable. Head: normocephalic, no gross deformities. Eyes: no gross deformities noted. ENT: ears appear grossly normal Neurologic: Alert and oriented. No involuntary movements.    EPWORTH SLEEPINESS SCALE:  Scale:  (0)= no chance of dozing; (1)= slight chance of dozing; (2)= moderate  chance of dozing; (3)= high chance of dozing  Chance  Situtation    Sitting and reading: 3    Watching TV: 1    Sitting Inactive in public: 0    As a passenger in car: 2      Lying down to rest: 3    Sitting and talking: 0    Sitting quielty after lunch: 1    In a car, stopped in traffic: 0   TOTAL SCORE:   10 out of 24     LABS: No results found for this or any previous visit (from the past 2160 hour(s)).  Radiology: No results found.  No results found.  No results found.    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   Noncompliance with treatment plan 04/02/2020   High risk medication use 11/02/2019   Bipolar 1 disorder, mixed, moderate (HCC) 09/26/2019   PTSD (post-traumatic stress disorder) 09/26/2019   Attention deficit hyperactivity disorder (ADHD), predominantly inattentive type 09/26/2019   Sleep disorder 09/26/2019   Panic attacks 09/26/2019   Hepatic steatosis 05/26/2019   Vitamin D deficiency 12/05/2018   Gastro-esophageal reflux disease without esophagitis 02/18/2018   IFG (impaired fasting glucose) 05/30/2017   Acne vulgaris 05/28/2017   Chronic nasopharyngitis 03/05/2017   Lumbosacral spondylosis without myelopathy 08/20/2016   Chlamydia infection 07/31/2016   Pap smear of cervix shows low risk HPV present 07/31/2016   Obstructive sleep apnea syndrome, moderate 04/28/2016   Iron deficiency 04/20/2016   Fibromyalgia 03/31/2016   Chronic bilateral low back pain without sciatica 02/21/2016   Family history of Hashimoto thyroiditis 02/21/2016   Morbid obesity with BMI of 50.0-59.9, adult (HCC) 02/21/2016   Scoliosis 02/21/2016   Bipolar 1 disorder, depressed, severe (HCC) 08/19/2015   Anxiety  11/20/2014   Arthropathy of lumbar facet joint 05/24/2013     Will resend Provigil at 100mg  per day for 30 days and see how she does with this. She will continue to use CPAP nightly.Previously tried sunosi and Nuvigil which did not help and is already on Vyvance by for psych provider. If not improving may need to discuss med side effects with psych provider as contributing to sleepiness. Still need copies of previous studies.  1. OSA (obstructive sleep apnea) Continue nightly use  2. CPAP use counseling CPAP couseling-Discussed importance of adequate CPAP use as well as proper care and cleaning techniques of machine and all supplies.  3. Idiopathic hypersomnia Will start 100mg  provigil daily. May need to titrate up in future. Pt will call if any difficulty obtaining medication. Educated not to combine with nuvigil--though states she has been off this for awhile since it wasn't working. - modafinil (PROVIGIL) 100 MG tablet; Take 1 tab by mouth daily for EDS  Dispense: 30 tablet; Refill: 1  4. Bipolar 1 disorder, depressed, severe (HCC) Followed by pysch  5. Anxiety Followed by psych  6. Morbid obesity with BMI of 50.0-59.9, adult (HCC) Obesity Counseling: Had a lengthy discussion regarding patients BMI and weight issues. Patient was instructed on portion control as well as increased activity. Also discussed caloric restrictions with trying to maintain intake less than 2000 Kcal. Discussions were made in accordance with the 5As of weight management. Simple actions such as not eating late and if able to, taking a walk is suggested.    General Counseling: I have discussed the findings of the evaluation and examination with Karon.  I have also discussed any further diagnostic evaluation thatmay be needed or ordered today. Haydan verbalizes understanding of the findings of todays visit. We also reviewed her medications today and discussed drug interactions and side effects including but  not limited excessive drowsiness and altered mental states. We also discussed that there is always a risk not just to her but also people around her. she has been encouraged to call the office with any questions or concerns that should arise related to todays visit.  No orders of the defined types were placed in this encounter.       I have personally obtained a history,  evaluated the patient, evaluated pertinent data, formulated the assessment and plan and placed orders.  This patient was seen by Lynn Ito, PA-C in collaboration with Dr. Freda Munro as a part of collaborative care agreement.   Yevonne Pax, MD [facility] Diplomate ABMS Pulmonary Critical Care Medicine and Sleep medicine

## 2021-01-15 NOTE — Patient Instructions (Signed)
Hypersomnia Hypersomnia is a condition in which a person feels very tired during the day even though he or she gets plenty of sleep at night. A person with this condition may take naps during the day and may find it very difficult to wake up from sleep. Hypersomnia may affect a person's ability to think, concentrate, drive, or remember things. What are the causes? The cause of this condition may not be known. Possible causes include: Certain medicines. Sleep disorders, such as narcolepsy and sleep apnea. Injury to the head, brain, or spinal cord. Drug or alcohol use. Gastroesophageal reflux disease (GERD). Tumors. Certain medical conditions, such as depression, diabetes, or an underactive thyroid gland (hypothyroidism). What are the signs or symptoms? The main symptoms of hypersomnia include: Feeling very tired throughout the day, regardless of how much sleep you got the night before. Having trouble waking up. Others may find it difficult to wake you up when you are sleeping. Sleeping for longer and longer periods at a time. Taking naps throughout the day. Other symptoms may include: Feeling restless, anxious, or annoyed. Lacking energy. Having trouble with: Remembering. Speaking. Thinking. Loss of appetite. Seeing, hearing, tasting, smelling, or feeling things that are not real (hallucinations). How is this diagnosed? This condition may be diagnosed based on: Your symptoms and medical history. Your sleeping habits. Your health care provider may ask you to write down your sleeping habits in a daily sleep log, along with any symptoms you have. A series of tests that are done while you sleep (sleep study or polysomnogram). A test that measures how quickly you can fall asleep during the day (daytime nap study or multiple sleep latency test). How is this treated? Treatment can help you manage your condition. Treatment may include: Following a regular sleep routine. Lifestyle changes,  such as changing your eating habits, getting regular exercise, and avoiding alcohol or caffeinated beverages. Taking medicines to make you more alert (stimulants) during the day. Treating any underlying medical causes of hypersomnia. Follow these instructions at home: Sleep routine  Schedule the same bedtime and wake-up time each day. Practice a relaxing bedtime routine. This may include reading, meditation, deep breathing, or taking a warm bath before going to sleep. Get regular exercise each day. Avoid strenuous exercise in the evening hours. Keep your sleep environment at a cooler temperature, darkened, and quiet. Sleep with pillows and a mattress that are comfortable and supportive. Schedule short 20-minute naps for when you feel sleepiest during the day. Talk with your employer or teachers about your hypersomnia. If possible, adjust your schedule so that: You have a regular daytime work schedule. You can take a scheduled nap during the day. You do not have to work or be active at night. Do not eat a heavy meal for a few hours before bedtime. Eat your meals at about the same times every day. Avoid drinking alcohol or caffeinated beverages. Safety  Do not drive or use heavy machinery if you are sleepy. Ask your health care provider if it is safe for you to drive. Wear a life jacket when swimming or spending time near water. General instructions Take supplements and over-the-counter and prescription medicines only as told by your health care provider. Keep a sleep log that will help your doctor manage your condition. This may include information about: What time you go to bed each night. How often you wake up at night. How many hours you sleep at night. How often and for how long you nap during the day.   Any observations from others, such as leg movements during sleep, sleep walking, or snoring. Keep all follow-up visits as told by your health care provider. This is important. Contact  a health care provider if: You have new symptoms. Your symptoms get worse. Get help right away if: You have serious thoughts about hurting yourself or someone else. If you ever feel like you may hurt yourself or others, or have thoughts about taking your own life, get help right away. You can go to your nearest emergency department or call: Your local emergency services (911 in the [place] A suicide crisis helpline, such as the National Suicide Prevention Lifeline at 1-800-273-8255 or 988 in the [place] This is open 24 hours a day. Summary Hypersomnia refers to a condition in which you feel very tired during the day even though you get plenty of sleep at night. A person with this condition may take naps during the day and may find it very difficult to wake up from sleep. Hypersomnia may affect a person's ability to think, concentrate, drive, or remember things. Treatment, such as following a regular sleep routine and making some lifestyle changes, can help you manage your condition. This information is not intended to replace advice given to you by your health care provider. Make sure you discuss any questions you have with your health care provider. Document Revised: 08/07/2020 Document Reviewed: 11/23/2019 [employer] Patient Education  2022 [employer]  

## 2021-02-26 ENCOUNTER — Other Ambulatory Visit: Payer: Self-pay | Admitting: Physician Assistant

## 2021-02-26 DIAGNOSIS — G4711 Idiopathic hypersomnia with long sleep time: Secondary | ICD-10-CM

## 2021-02-26 MED ORDER — MODAFINIL 200 MG PO TABS: 200.0000 mg | ORAL_TABLET | Freq: Every day | ORAL | 0 refills | Status: DC

## 2021-03-17 NOTE — Progress Notes (Signed)
Sleep Medicine   Office Visit  Patient Name: Denise Conrad DOB: [birth year] MRN [id number]    Chief Complaint: OSA and hypersomnia   HISTORY OF PRESENT ILLNESS: Denise Conrad is seen today for follow up visit for medications and CPAP@ 11 cmH2O. The patient has been taking provigil in AM  at the following dose 200mg . The patient reports symptoms have improved since increasing medication dose from 100mg  to 200mg . Sleep quality is the same since starting medication and the patient has improved daytime sleepiness. The Epworth Sleepiness Score is 11. The patient reports no side effects from the medication. Patient uses CPAP nightly and is well controlled.   ROS  General: (-) fever, (-) chills, (-) night sweat Nose and Sinuses: (-) nasal stuffiness or itchiness, (-) postnasal drip, (-) nosebleeds, (-) sinus trouble. Mouth and Throat: (-) sore throat, (-) hoarseness. Neck: (-) swollen glands, (-) enlarged thyroid, (-) neck pain. Respiratory: - cough, - shortness of breath, - wheezing. Neurologic: - numbness, - tingling. Psychiatric: + anxiety, + depression Sleep behavior: -sleep paralysis -hypnogogic hallucinations -dream enactment      -vivid dreams -cataplexy -night terrors -sleep walking   Current Medication: Outpatient Encounter Medications as of 03/18/2021  Medication Sig   amLODipine (NORVASC) 5 MG tablet amlodipine 5 mg tablet  TAKE 1 TABLET BY MOUTH EVERY DAY   amoxicillin (AMOXIL) 875 MG tablet Take 875 mg by mouth 2 (two) times daily. (Patient not taking: Reported on 03/05/2020)   ARIPiprazole (ABILIFY) 15 MG tablet aripiprazole 15 mg tablet   baclofen (LIORESAL) 10 MG tablet TAKE 1 TABLET BY MOUTH THREE TIMES A DAY AS NEEDED   BOTOX 200 units SOLR    calcium carbonate (TUMS - DOSED IN MG ELEMENTAL CALCIUM) 500 MG chewable tablet Chew 1-2 tablets by mouth as needed for indigestion or heartburn.   cetirizine (ZYRTEC) 10 MG tablet Take 10 mg by mouth at bedtime.   clonazePAM  (KLONOPIN) 0.5 MG tablet clonazepam 0.5 mg tablet   Cyanocobalamin (B-12) 1000 MCG CAPS Take 1,000 mcg by mouth daily.    fesoterodine (TOVIAZ) 8 MG TB24 tablet Take 8 mg by mouth at bedtime.   levonorgestrel (LILETTA, 52 MG,) 19.5 MCG/DAY IUD IUD 1 each by Intrauterine route once.   lisdexamfetamine (VYVANSE) 60 MG capsule Take 1 capsule (60 mg total) by mouth every morning.   lisdexamfetamine (VYVANSE) 60 MG capsule Take 1 capsule (60 mg total) by mouth every morning. Take daily at 8:30 AM   lurasidone (LATUDA) 40 MG TABS tablet Take 1 tablet (40 mg total) by mouth daily with breakfast.   lurasidone (LATUDA) 80 MG TABS tablet Take 1 tablet (80 mg total) by mouth daily with supper.   Melatonin 10 MG TABS Take 20-40 mg by mouth at bedtime as needed (for sleep).    metFORMIN (GLUCOPHAGE) 500 MG tablet Take by mouth.   modafinil (PROVIGIL) 200 MG tablet Take 1 tablet (200 mg total) by mouth daily.   MOUNJARO 2.5 MG/0.5ML Pen Inject into the skin.   pantoprazole (PROTONIX) 40 MG tablet Take 40 mg by mouth 2 (two) times daily.   pregabalin (LYRICA) 150 MG capsule Take 150 mg by mouth 2 (two) times daily.   venlafaxine XR (EFFEXOR-XR) 150 MG 24 hr capsule Take 1 capsule (150 mg total) by mouth daily with breakfast.   [DISCONTINUED] modafinil (PROVIGIL) 200 MG tablet Take 1 tablet (200 mg total) by mouth daily.   No facility-administered encounter medications on file as of 03/18/2021.    Surgical History: Past  Surgical History:  Procedure Laterality Date   IUD INSERTION  05/16/2019       TEMPOROMANDIBULAR JOINT ARTHROPLASTY  2015   TONSILLECTOMY     WISDOM TOOTH EXTRACTION      Medical History: Past Medical History:  Diagnosis Date   Acute renal failure (HCC) 11/22/2014   Acute suppurative tonsillitis 11/20/2014   Anxiety    Bipolar 1 disorder (HCC)    Depression    Fibromyalgia    Hypertensive disorder 08/27/2020   Liver disease    tumor on liver   Sepsis (HCC) 11/20/2014     Family History: Non contributory to the present illness  Social History: Social History   Socioeconomic History   Marital status: Single    Spouse name: Not on file   Number of children: Not on file   Years of education: Not on file   Highest education level: Not on file  Occupational History   Not on file  Tobacco Use   Smoking status: Never   Smokeless tobacco: Never  Vaping Use   Vaping Use: Never used  Substance and Sexual Activity   Alcohol use: No   Drug use: No   Sexual activity: Yes    Partners: Male  Other Topics Concern   Not on file  Social History Narrative   Not on file   Social Determinants of Health   Financial Resource Strain: Not on file  Food Insecurity: Not on file  Transportation Needs: Not on file  Physical Activity: Not on file  Stress: Not on file  Social Connections: Not on file  Intimate Partner Violence: Not on file    Vital Signs: Blood pressure (!) 126/95, pulse (!) 103, resp. rate 18, height 5\' 6"  (1.676 m), weight (!) 332 lb (150.6 kg), SpO2 97 %. Body mass index is 53.59 kg/m.   Examination: General Appearance: The patient is well-developed, well-nourished, and in no distress. Neck Circumference: 51 cm Skin: Gross inspection of skin unremarkable. Head: normocephalic, no gross deformities. Eyes: no gross deformities noted. ENT: ears appear grossly normal Neurologic: Alert and oriented. No involuntary movements.    EPWORTH SLEEPINESS SCALE:  Scale:  (0)= no chance of dozing; (1)= slight chance of dozing; (2)= moderate chance of dozing; (3)= high chance of dozing  Chance  Situtation    Sitting and reading: 3    Watching TV: 1    Sitting Inactive in public: 0    As a passenger in car: 2      Lying down to rest: 3    Sitting and talking: 1    Sitting quielty after lunch: 1    In a car, stopped in traffic: 0   TOTAL SCORE:   11 out of 24    SLEEP STUDIES:  Not available   CPAP COMPLIANCE DATA:    Date Range: 12/19/2020-03/18/2021   Average Daily Use: 12 hours 46 minutes   Median Use: 12 hours 43 minutes   Compliance for > 4 Hours: 99 %   AHI: 0.6 respiratory events per hour   Days Used: 90/90 days   Mask Leak: 5.3   95th Percentile Pressure: 11    LABS: No results found for this or any previous visit (from the past 2160 hour(s)).  Radiology: No results found.  No results found.  No results found.    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   Noncompliance with treatment plan 04/02/2020   High risk medication use 11/02/2019   Bipolar 1 disorder, mixed, moderate (HCC) 09/26/2019   PTSD (post-traumatic stress disorder) 09/26/2019   Attention deficit hyperactivity disorder (ADHD), predominantly inattentive type 09/26/2019   Sleep disorder 09/26/2019   Panic attacks 09/26/2019   Hepatic steatosis 05/26/2019   Vitamin D deficiency 12/05/2018   Gastro-esophageal reflux disease without esophagitis 02/18/2018   IFG (impaired fasting glucose) 05/30/2017   Acne vulgaris 05/28/2017   Chronic nasopharyngitis 03/05/2017   Lumbosacral spondylosis without myelopathy 08/20/2016   Chlamydia infection 07/31/2016   Pap smear of cervix shows low risk HPV present 07/31/2016   Obstructive sleep apnea syndrome, moderate 04/28/2016   Iron deficiency 04/20/2016   Fibromyalgia 03/31/2016   Chronic bilateral low back pain without sciatica 02/21/2016   Family history of Hashimoto thyroiditis 02/21/2016   Morbid obesity with BMI of 50.0-59.9, adult (HCC) 02/21/2016   Scoliosis 02/21/2016   Bipolar 1 disorder, depressed, severe (HCC) 08/19/2015   Anxiety 11/20/2014   Arthropathy of lumbar facet joint 05/24/2013     PLAN OSA:    Patient is compliant on CPAP and well controlled. She is tolerating Provigil and benefiting from use.   1. OSA (obstructive sleep apnea) Continue excellent compliance  2. CPAP use counseling CPAP couseling-Discussed importance of adequate CPAP use as well as proper care and cleaning techniques of machine and all supplies.  3. Idiopathic hypersomnia May continue provigil 200mg  in AM - modafinil (PROVIGIL) 200 MG tablet; Take 1 tablet (200 mg total) by mouth daily.  Dispense: 30 tablet; Refill: 2  4. Bipolar 1 disorder, depressed, severe (HCC) Followed by psych  5. Anxiety Followed by psych  6. Morbid obesity with BMI of 50.0-59.9, adult (HCC) Has lost 9lbs since last visit Obesity Counseling: Had a lengthy discussion regarding patients BMI and weight issues. Patient was instructed on portion control as well as increased activity. Also discussed caloric restrictions with trying to maintain intake less than 2000 Kcal. Discussions were made in accordance with the 5As of weight management. Simple actions such as not eating late and if able to, taking a walk is suggested.    General Counseling: I have discussed the findings of the evaluation and examination with Aniyiah.  I have also discussed any further diagnostic evaluation thatmay be needed or ordered today. Caasi verbalizes understanding of the findings of todays visit. We also reviewed her medications today and discussed drug interactions and side effects including but not limited excessive drowsiness and altered mental states. We also discussed that there is always a risk not just to her but also people around her. she has been encouraged to call the office with any questions or concerns that should arise related to todays visit.  No orders of the defined types were placed in this encounter.       I have personally obtained a history, evaluated the patient, evaluated pertinent data, formulated the assessment and plan and  placed orders.  This patient was seen by 05-24-1994, PA-C in collaboration with Dr. Lynn Ito as a part of collaborative care agreement.   Freda Munro, MD [facility] Diplomate ABMS Pulmonary Critical Care Medicine and Sleep medicine

## 2021-03-18 ENCOUNTER — Ambulatory Visit (INDEPENDENT_AMBULATORY_CARE_PROVIDER_SITE_OTHER): Payer: Medicaid Other | Admitting: Internal Medicine

## 2021-03-18 DIAGNOSIS — F419 Anxiety disorder, unspecified: Secondary | ICD-10-CM

## 2021-03-18 DIAGNOSIS — G4733 Obstructive sleep apnea (adult) (pediatric): Secondary | ICD-10-CM | POA: Diagnosis not present

## 2021-03-18 DIAGNOSIS — G4711 Idiopathic hypersomnia with long sleep time: Secondary | ICD-10-CM

## 2021-03-18 DIAGNOSIS — Z6841 Body Mass Index (BMI) 40.0 and over, adult: Secondary | ICD-10-CM

## 2021-03-18 DIAGNOSIS — F314 Bipolar disorder, current episode depressed, severe, without psychotic features: Secondary | ICD-10-CM | POA: Diagnosis not present

## 2021-03-18 DIAGNOSIS — Z7189 Other specified counseling: Secondary | ICD-10-CM

## 2021-03-18 MED ORDER — MODAFINIL 200 MG PO TABS: 200.0000 mg | ORAL_TABLET | Freq: Every day | ORAL | 2 refills | Status: DC

## 2021-03-18 NOTE — Patient Instructions (Signed)

## 2021-06-18 ENCOUNTER — Ambulatory Visit: Payer: Medicaid Other | Admitting: Internal Medicine

## 2021-06-18 DIAGNOSIS — Z91199 Patient's noncompliance with other medical treatment and regimen due to unspecified reason: Secondary | ICD-10-CM

## 2021-06-18 NOTE — Progress Notes (Signed)
Pt rescheduled

## 2021-06-20 NOTE — Progress Notes (Signed)
Sleep Medicine   Office Visit  Patient Name: Denise Conrad DOB: [birth year] MRN [id number]    Chief Complaint: OSA and med refill   HISTORY OF PRESENT ILLNESS: Denise Conrad is seen today for a 3 month  follow up on CPAP@11  cmh20 and Provigil. The patient has been taking Provigil at the following dose 200mg  once per a.m. The patient reports symptoms have improved since starting medication. Sleep quality is fair since starting medication and the patient  has improved daytime sleepiness. The Epworth Sleepiness Score is 9. The patient reports no side effects from the medication. CPAP download reveals 99% compliance 90/90 days, 13.5 hrs avg usage, AHI 0.6, leak 15.2. Patient obtains her supplies from an online source.   ROS  General: (-) fever, (-) chills, (-) night sweat Nose and Sinuses: (-) nasal stuffiness or itchiness, (-) postnasal drip, (-) nosebleeds, (-) sinus trouble. Mouth and Throat: (-) sore throat, (-) hoarseness. Neck: (-) swollen glands, (-) enlarged thyroid, (-) neck pain. Respiratory: - cough, - shortness of breath, - wheezing. Neurologic: - numbness, - tingling. Psychiatric: + anxiety, + depression    Current Medication: Outpatient Encounter Medications as of 06/24/2021  Medication Sig   amLODipine (NORVASC) 5 MG tablet amlodipine 5 mg tablet  TAKE 1 TABLET BY MOUTH EVERY DAY   amoxicillin (AMOXIL) 875 MG tablet Take 875 mg by mouth 2 (two) times daily. (Patient not taking: Reported on 03/05/2020)   ARIPiprazole (ABILIFY) 15 MG tablet aripiprazole 15 mg tablet   baclofen (LIORESAL) 10 MG tablet TAKE 1 TABLET BY MOUTH THREE TIMES A DAY AS NEEDED   BOTOX 200 units SOLR    calcium carbonate (TUMS - DOSED IN MG ELEMENTAL CALCIUM) 500 MG chewable tablet Chew 1-2 tablets by mouth as needed for indigestion or heartburn.   cetirizine (ZYRTEC) 10 MG tablet Take 10 mg by mouth at bedtime.   clonazePAM (KLONOPIN) 0.5 MG tablet clonazepam 0.5 mg tablet   Cyanocobalamin (B-12)  1000 MCG CAPS Take 1,000 mcg by mouth daily.    fesoterodine (TOVIAZ) 8 MG TB24 tablet Take 8 mg by mouth at bedtime.   levonorgestrel (LILETTA, 52 MG,) 19.5 MCG/DAY IUD IUD 1 each by Intrauterine route once.   lisdexamfetamine (VYVANSE) 60 MG capsule Take 1 capsule (60 mg total) by mouth every morning.   lisdexamfetamine (VYVANSE) 60 MG capsule Take 1 capsule (60 mg total) by mouth every morning. Take daily at 8:30 AM   lurasidone (LATUDA) 40 MG TABS tablet Take 1 tablet (40 mg total) by mouth daily with breakfast.   lurasidone (LATUDA) 80 MG TABS tablet Take 1 tablet (80 mg total) by mouth daily with supper.   Melatonin 10 MG TABS Take 20-40 mg by mouth at bedtime as needed (for sleep).    metFORMIN (GLUCOPHAGE) 500 MG tablet Take by mouth.   [START ON 07/06/2021] modafinil (PROVIGIL) 200 MG tablet Take 1 tablet (200 mg total) by mouth daily.   MOUNJARO 2.5 MG/0.5ML Pen Inject into the skin.   pantoprazole (PROTONIX) 40 MG tablet Take 40 mg by mouth 2 (two) times daily.   pregabalin (LYRICA) 150 MG capsule Take 150 mg by mouth 2 (two) times daily.   venlafaxine XR (EFFEXOR-XR) 150 MG 24 hr capsule Take 1 capsule (150 mg total) by mouth daily with breakfast.   [DISCONTINUED] modafinil (PROVIGIL) 200 MG tablet Take 1 tablet (200 mg total) by mouth daily.   No facility-administered encounter medications on file as of 06/24/2021.    Surgical History: Past Surgical History:  Procedure Laterality Date   IUD INSERTION  05/16/2019       TEMPOROMANDIBULAR JOINT ARTHROPLASTY  2015   TONSILLECTOMY     WISDOM TOOTH EXTRACTION      Medical History: Past Medical History:  Diagnosis Date   Acute renal failure (HCC) 11/22/2014   Acute suppurative tonsillitis 11/20/2014   Anxiety    Bipolar 1 disorder (HCC)    Depression    Fibromyalgia    Hypertensive disorder 08/27/2020   Liver disease    tumor on liver   Sepsis (HCC) 11/20/2014    Family History: Non contributory to the present  illness  Social History: Social History   Socioeconomic History   Marital status: Single    Spouse name: Not on file   Number of children: Not on file   Years of education: Not on file   Highest education level: Not on file  Occupational History   Not on file  Tobacco Use   Smoking status: Never   Smokeless tobacco: Never  Vaping Use   Vaping Use: Never used  Substance and Sexual Activity   Alcohol use: No   Drug use: No   Sexual activity: Yes    Partners: Male  Other Topics Concern   Not on file  Social History Narrative   Not on file   Social Determinants of Health   Financial Resource Strain: Not on file  Food Insecurity: Not on file  Transportation Needs: Not on file  Physical Activity: Not on file  Stress: Not on file  Social Connections: Not on file  Intimate Partner Violence: Not on file    Vital Signs: Blood pressure 125/87, pulse 97, resp. rate 12, height 5\' 6"  (1.676 m), weight (!) 307 lb 8 oz (139.5 kg), SpO2 97 %. Body mass index is 49.63 kg/m.   Examination: General Appearance: The patient is well-developed, well-nourished, and in no distress. Neck Circumference: 44.5cm Skin: Gross inspection of skin unremarkable. Head: normocephalic, no gross deformities. Eyes: no gross deformities noted. ENT: ears appear grossly normal Neurologic: Alert and oriented. No involuntary movements.    EPWORTH SLEEPINESS SCALE:  Scale:  (0)= no chance of dozing; (1)= slight chance of dozing; (2)= moderate chance of dozing; (3)= high chance of dozing  Chance  Situtation    Sitting and reading: 3    Watching TV: 1    Sitting Inactive in public: 0    As a passenger in car: 1      Lying down to rest: 3    Sitting and talking: 0    Sitting quielty after lunch: 1    In a car, stopped in traffic: 0   TOTAL SCORE:   9 out of 24    SLEEP STUDIES:  Not available   LABS: No results found for this or any previous visit (from the past 2160  hour(s)).  Radiology: No results found.  No results found.  No results found.    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   Noncompliance with treatment plan 04/02/2020   High risk medication use 11/02/2019   Bipolar 1 disorder, mixed, moderate (HCC) 09/26/2019   PTSD (post-traumatic stress disorder) 09/26/2019   Attention deficit hyperactivity disorder (ADHD), predominantly inattentive type 09/26/2019   Sleep disorder 09/26/2019  Panic attacks 09/26/2019   Hepatic steatosis 05/26/2019   Vitamin D deficiency 12/05/2018   Gastro-esophageal reflux disease without esophagitis 02/18/2018   IFG (impaired fasting glucose) 05/30/2017   Acne vulgaris 05/28/2017   Chronic nasopharyngitis 03/05/2017   Lumbosacral spondylosis without myelopathy 08/20/2016   Chlamydia infection 07/31/2016   Pap smear of cervix shows low risk HPV present 07/31/2016   Obstructive sleep apnea syndrome, moderate 04/28/2016   Iron deficiency 04/20/2016   Fibromyalgia 03/31/2016   Chronic bilateral low back pain without sciatica 02/21/2016   Family history of Hashimoto thyroiditis 02/21/2016   Morbid obesity with BMI of 50.0-59.9, adult (HCC) 02/21/2016   Scoliosis 02/21/2016   Bipolar 1 disorder, depressed, severe (HCC) 08/19/2015   Anxiety 11/20/2014   Arthropathy of lumbar facet joint 05/24/2013     PLAN OSA:   Patient is compliant on CPAP and well controlled. She is tolerating Provigil and benefiting from use.   1. OSA (obstructive sleep apnea) Continue excellent compliance  2. CPAP use counseling CPAP couseling-Discussed importance of adequate CPAP use as well as proper care and cleaning techniques of machine and all supplies.  3. Idiopathic  hypersomnia May continue provigil 200mg  qAM - modafinil (PROVIGIL) 200 MG tablet; Take 1 tablet (200 mg total) by mouth daily.  Dispense: 30 tablet; Refill: 2  4. Bipolar 1 disorder, depressed, severe (HCC) Followed by psych  5. Anxiety Followed by psych  6. Morbid obesity with BMI of 45.0-49.9, adult (HCC) Pt has lost 25lbs since last visit Obesity Counseling: Had a lengthy discussion regarding patients BMI and weight issues. Patient was instructed on portion control as well as increased activity. Also discussed caloric restrictions with trying to maintain intake less than 2000 Kcal. Discussions were made in accordance with the 5As of weight management. Simple actions such as not eating late and if able to, taking a walk is suggested.     General Counseling: I have discussed the findings of the evaluation and examination with Lanny.  I have also discussed any further diagnostic evaluation thatmay be needed or ordered today. Mishel verbalizes understanding of the findings of todays visit. We also reviewed her medications today and discussed drug interactions and side effects including but not limited excessive drowsiness and altered mental states. We also discussed that there is always a risk not just to her but also people around her. she has been encouraged to call the office with any questions or concerns that should arise related to todays visit.  No orders of the defined types were placed in this encounter.       I have personally obtained a history, evaluated the patient, evaluated pertinent data, formulated the assessment and plan and placed orders.  This patient was seen by 06-21-1983, PA-C in collaboration with Dr. Lynn Ito as a part of collaborative care agreement.   Freda Munro, MD [facility] Diplomate ABMS Pulmonary Critical Care Medicine and Sleep medicine

## 2021-06-24 ENCOUNTER — Ambulatory Visit (INDEPENDENT_AMBULATORY_CARE_PROVIDER_SITE_OTHER): Payer: Medicaid Other | Admitting: Internal Medicine

## 2021-06-24 VITALS — BP 125/87 | HR 97 | Resp 12 | Ht 66.0 in | Wt 307.5 lb

## 2021-06-24 DIAGNOSIS — Z6841 Body Mass Index (BMI) 40.0 and over, adult: Secondary | ICD-10-CM

## 2021-06-24 DIAGNOSIS — Z7189 Other specified counseling: Secondary | ICD-10-CM | POA: Diagnosis not present

## 2021-06-24 DIAGNOSIS — F314 Bipolar disorder, current episode depressed, severe, without psychotic features: Secondary | ICD-10-CM | POA: Diagnosis not present

## 2021-06-24 DIAGNOSIS — G4711 Idiopathic hypersomnia with long sleep time: Secondary | ICD-10-CM | POA: Diagnosis not present

## 2021-06-24 DIAGNOSIS — F419 Anxiety disorder, unspecified: Secondary | ICD-10-CM

## 2021-06-24 DIAGNOSIS — G4733 Obstructive sleep apnea (adult) (pediatric): Secondary | ICD-10-CM | POA: Diagnosis not present

## 2021-06-24 MED ORDER — MODAFINIL 200 MG PO TABS: 200.0000 mg | ORAL_TABLET | Freq: Every day | ORAL | 2 refills | Status: DC

## 2021-06-24 NOTE — Patient Instructions (Signed)

## 2021-10-08 ENCOUNTER — Other Ambulatory Visit: Payer: Self-pay | Admitting: Physician Assistant

## 2021-10-08 DIAGNOSIS — G4711 Idiopathic hypersomnia with long sleep time: Secondary | ICD-10-CM

## 2021-10-26 HISTORY — PX: GINGIVECTOMY: SHX1707

## 2021-11-11 ENCOUNTER — Other Ambulatory Visit: Payer: Self-pay | Admitting: Physician Assistant

## 2021-11-11 DIAGNOSIS — G4711 Idiopathic hypersomnia with long sleep time: Secondary | ICD-10-CM

## 2021-11-11 NOTE — Telephone Encounter (Signed)
Feeling great pt please take care

## 2021-11-18 NOTE — Progress Notes (Signed)
Sleep Medicine   Office Visit  Patient Name: Denise Conrad DOB: [birth year] MRN [id number]    Chief Complaint: OSA   HISTORY OF PRESENT ILLNESS: Denise Conrad is seen today for follow up for CPAP@ 11 cmH2O and for Provigil. The patient has been taking Provigil  at the following dose 200mg . The patient reports symptoms have improved since starting medication. Sleep quality is much better since starting medication and the patient denies excessive daytime sleepiness. The Epworth Sleepiness Score is 10. The patient reports no side effects from the medication. Patient is 99% compliant with CPAP with an AHI 0.8 and average usage hrs of 13.0   ROS  General: (-) fever, (-) chills, (-) night sweat Nose and Sinuses: (-) nasal stuffiness or itchiness, (-) postnasal drip, (-) nosebleeds, (-) sinus trouble. Mouth and Throat: (-) sore throat, (-) hoarseness. Neck: (-) swollen glands, (-) enlarged thyroid, (-) neck pain. Respiratory: - cough, - shortness of breath, - wheezing. Neurologic: - numbness, - tingling. Psychiatric: + anxiety, + depression    Current Medication: Outpatient Encounter Medications as of 11/19/2021  Medication Sig   amLODipine (NORVASC) 5 MG tablet amlodipine 5 mg tablet  TAKE 1 TABLET BY MOUTH EVERY DAY   amoxicillin (AMOXIL) 875 MG tablet Take 875 mg by mouth 2 (two) times daily. (Patient not taking: Reported on 03/05/2020)   ARIPiprazole (ABILIFY) 15 MG tablet aripiprazole 15 mg tablet   baclofen (LIORESAL) 10 MG tablet TAKE 1 TABLET BY MOUTH THREE TIMES A DAY AS NEEDED   BOTOX 200 units SOLR    calcium carbonate (TUMS - DOSED IN MG ELEMENTAL CALCIUM) 500 MG chewable tablet Chew 1-2 tablets by mouth as needed for indigestion or heartburn.   cetirizine (ZYRTEC) 10 MG tablet Take 10 mg by mouth at bedtime.   clonazePAM (KLONOPIN) 0.5 MG tablet clonazepam 0.5 mg tablet   Cyanocobalamin (B-12) 1000 MCG CAPS Take 1,000 mcg by mouth daily.    fesoterodine (TOVIAZ) 8 MG TB24  tablet Take 8 mg by mouth at bedtime.   levonorgestrel (LILETTA, 52 MG,) 19.5 MCG/DAY IUD IUD 1 each by Intrauterine route once.   lisdexamfetamine (VYVANSE) 60 MG capsule Take 1 capsule (60 mg total) by mouth every morning.   lisdexamfetamine (VYVANSE) 60 MG capsule Take 1 capsule (60 mg total) by mouth every morning. Take daily at 8:30 AM   lurasidone (LATUDA) 40 MG TABS tablet Take 1 tablet (40 mg total) by mouth daily with breakfast.   lurasidone (LATUDA) 80 MG TABS tablet Take 1 tablet (80 mg total) by mouth daily with supper.   Melatonin 10 MG TABS Take 20-40 mg by mouth at bedtime as needed (for sleep).    metFORMIN (GLUCOPHAGE) 500 MG tablet Take by mouth.   [START ON 12/11/2021] modafinil (PROVIGIL) 200 MG tablet Take 1 tablet (200 mg total) by mouth daily.   MOUNJARO 2.5 MG/0.5ML Pen Inject into the skin.   pantoprazole (PROTONIX) 40 MG tablet Take 40 mg by mouth 2 (two) times daily.   pregabalin (LYRICA) 150 MG capsule Take 150 mg by mouth 2 (two) times daily.   venlafaxine XR (EFFEXOR-XR) 150 MG 24 hr capsule Take 1 capsule (150 mg total) by mouth daily with breakfast.   [DISCONTINUED] PROVIGIL 200 MG tablet TAKE 1 TABLET BY MOUTH EVERY DAY   No facility-administered encounter medications on file as of 11/19/2021.    Surgical History: Past Surgical History:  Procedure Laterality Date   IUD INSERTION  05/16/2019       TEMPOROMANDIBULAR JOINT  ARTHROPLASTY  2015   TONSILLECTOMY     WISDOM TOOTH EXTRACTION      Medical History: Past Medical History:  Diagnosis Date   Acute renal failure ([place]) 11/22/2014   Acute suppurative tonsillitis 11/20/2014   Anxiety    Bipolar 1 disorder (HCC)    Depression    Fibromyalgia    Hypertensive disorder 08/27/2020   Liver disease    tumor on liver   Sepsis ([place]) 11/20/2014    Family History: Non contributory to the present illness  Social History: Social History   Socioeconomic History   Marital status: Single    Spouse name:  Not on file   Number of children: Not on file   Years of education: Not on file   Highest education level: Not on file  Occupational History   Not on file  Tobacco Use   Smoking status: Never   Smokeless tobacco: Never  Vaping Use   Vaping Use: Never used  Substance and Sexual Activity   Alcohol use: No   Drug use: No   Sexual activity: Yes    Partners: Male  Other Topics Concern   Not on file  Social History Narrative   Not on file   Social Determinants of Health   Financial Resource Strain: Not on file  Food Insecurity: Not on file  Transportation Needs: Not on file  Physical Activity: Not on file  Stress: Not on file  Social Connections: Not on file  Intimate Partner Violence: Not on file    Vital Signs: Blood pressure 108/82, pulse (!) 102, resp. rate 16, height 5\' 6"  (1.676 m), weight 295 lb 1.6 oz (133.9 kg), SpO2 95 %. Body mass index is 47.63 kg/m.   Examination: General Appearance: The patient is well-developed, well-nourished, and in no distress. Neck Circumference: 44cm Skin: Gross inspection of skin unremarkable. Head: normocephalic, no gross deformities. Eyes: no gross deformities noted. ENT: ears appear grossly normal Neurologic: Alert and oriented. No involuntary movements.    STOP BANG RISK ASSESSMENT S (snore) Have you been told that you snore?     No   T (tired) Are you often tired, fatigued, or sleepy during the day?   YES  O (obstruction) Do you stop breathing, choke, or gasp during sleep? NO   P (pressure) Do you have or are you being treated for high blood pressure? YES   B (BMI) Is your body index greater than 35 kg/m? YES   A (age) Are you 43 years old or older? NO   N (neck) Do you have a neck circumference greater than 16 inches?   YES   G (gender) Are you a female? NO   TOTAL STOP/BANG "YES" ANSWERS 4                                                 A STOP-Bang score of 2 or less is considered low risk, and a score of 5 or  more is high risk for having either moderate or severe OSA. For people who score 3 or 4, doctors may need to perform further assessment to determine how likely they are to have OSA.          EPWORTH SLEEPINESS SCALE:  Scale:  (0)= no chance of dozing; (1)= slight chance of dozing; (2)= moderate chance of dozing; (3)= high chance of dozing  Chance  Situtation    Sitting and reading: 2    Watching TV: 2    Sitting Inactive in public: 0    As a passenger in car: 1      Lying down to rest: 3    Sitting and talking: 0    Sitting quielty after lunch: 2    In a car, stopped in traffic: 0   TOTAL SCORE:   10 out of 24  CPAP Compliance  Average usage hours- 14 hrs. 4 min Greater than 4 hrs- 99% AHI- 0.8 Leak 8.5 Pressure- 11 cmh20    SLEEP STUDIES:     LABS: No results found for this or any previous visit (from the past 2160 hour(s)).  Radiology: No results found.  No results found.  No results found.    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   Noncompliance with treatment plan 04/02/2020   High risk medication use 11/02/2019   Bipolar 1 disorder, mixed, moderate ([place]) 09/26/2019   PTSD (post-traumatic stress disorder) 09/26/2019   Attention deficit hyperactivity disorder (ADHD), predominantly inattentive type 09/26/2019   Sleep disorder 09/26/2019   Panic attacks 09/26/2019   Hepatic steatosis 05/26/2019   Vitamin D deficiency 12/05/2018   Gastro-esophageal reflux disease without esophagitis 02/18/2018   IFG (impaired fasting glucose) 05/30/2017   Acne vulgaris 05/28/2017   Chronic nasopharyngitis 03/05/2017   Lumbosacral spondylosis without myelopathy 08/20/2016   Chlamydia infection 07/31/2016   Pap  smear of cervix shows low risk HPV present 07/31/2016   Obstructive sleep apnea syndrome, moderate 04/28/2016   Iron deficiency 04/20/2016   Fibromyalgia 03/31/2016   Chronic bilateral low back pain without sciatica 02/21/2016   Family history of Hashimoto thyroiditis 02/21/2016   Morbid obesity with BMI of 50.0-59.9, adult ([place]) 02/21/2016   Scoliosis 02/21/2016   Bipolar 1 disorder, depressed, severe ([place]) 08/19/2015   Anxiety 11/20/2014   Arthropathy of lumbar facet joint 05/24/2013        1. OSA (obstructive sleep apnea) Continue excellent compliance  2. CPAP use counseling CPAP couseling-Discussed importance of adequate CPAP use as well as proper care and cleaning techniques of machine and all supplies.  3. Idiopathic hypersomnia May continue provigil daily. Patient recently filled script last week and will be given 5 additional months worth of refills when eligible. Patient is aware of potential side effects and combinations with other medications - modafinil (PROVIGIL) 200 MG tablet; Take 1 tablet (200 mg total) by mouth daily.  Dispense: 30 tablet; Refill: 4  4. Bipolar 1 disorder, depressed, severe ([place]) Followed by psych  5. Anxiety Followed by psych  6. Morbid obesity with BMI of 45.0-49.9, adult ([place]) Obesity Counseling: Had a lengthy discussion regarding patients BMI and weight issues. Patient was instructed on portion control as well as increased activity. Also discussed caloric restrictions with trying to maintain intake less than 2000 Kcal. Discussions were made in accordance with the 5As of weight management. Simple actions such as not eating late and if able to, taking a walk is suggested.    General Counseling: I have discussed the findings of the evaluation and examination with Denise Conrad.  I have also discussed any further diagnostic evaluation thatmay be needed or ordered today. Denise Conrad verbalizes understanding of the findings of todays visit.  We also  reviewed her medications today and discussed drug interactions and side effects including but not limited excessive drowsiness and altered mental states. We also discussed that there is always a risk not just to her but also people around her. she has been encouraged to call the office with any questions or concerns that should arise related to todays visit.  No orders of the defined types were placed in this encounter.       I have personally obtained a history, evaluated the patient, evaluated pertinent data, formulated the assessment and plan and placed orders.  This patient was seen by Drema Dallas, PA-C in collaboration with Dr. Devona Konig as a part of collaborative care agreement.   Allyne Gee, MD [facility] Diplomate ABMS Pulmonary Critical Care Medicine and Sleep medicine

## 2021-11-19 ENCOUNTER — Ambulatory Visit (INDEPENDENT_AMBULATORY_CARE_PROVIDER_SITE_OTHER): Payer: Medicaid Other | Admitting: Internal Medicine

## 2021-11-19 VITALS — BP 108/82 | HR 102 | Resp 16 | Ht 66.0 in | Wt 295.1 lb

## 2021-11-19 DIAGNOSIS — F314 Bipolar disorder, current episode depressed, severe, without psychotic features: Secondary | ICD-10-CM

## 2021-11-19 DIAGNOSIS — G4733 Obstructive sleep apnea (adult) (pediatric): Secondary | ICD-10-CM | POA: Diagnosis not present

## 2021-11-19 DIAGNOSIS — Z7189 Other specified counseling: Secondary | ICD-10-CM | POA: Diagnosis not present

## 2021-11-19 DIAGNOSIS — F419 Anxiety disorder, unspecified: Secondary | ICD-10-CM | POA: Diagnosis not present

## 2021-11-19 DIAGNOSIS — Z6841 Body Mass Index (BMI) 40.0 and over, adult: Secondary | ICD-10-CM

## 2021-11-19 DIAGNOSIS — G4711 Idiopathic hypersomnia with long sleep time: Secondary | ICD-10-CM | POA: Diagnosis not present

## 2021-11-19 MED ORDER — MODAFINIL 200 MG PO TABS: 200.0000 mg | ORAL_TABLET | Freq: Every day | ORAL | 4 refills | Status: DC

## 2021-11-19 NOTE — Patient Instructions (Signed)

## 2022-02-04 ENCOUNTER — Ambulatory Visit: Payer: Medicaid Other | Admitting: Neurology

## 2022-02-10 ENCOUNTER — Encounter: Payer: Self-pay | Admitting: Psychiatry

## 2022-02-10 ENCOUNTER — Ambulatory Visit: Payer: Medicaid Other | Admitting: Psychiatry

## 2022-02-10 VITALS — BP 111/81 | HR 99 | Ht 66.0 in | Wt 294.0 lb

## 2022-02-10 DIAGNOSIS — G43719 Chronic migraine without aura, intractable, without status migrainosus: Secondary | ICD-10-CM | POA: Diagnosis not present

## 2022-02-10 MED ORDER — BACLOFEN 10 MG PO TABS: 10.0000 mg | ORAL_TABLET | Freq: Three times a day (TID) | ORAL | 6 refills | Status: DC | PRN

## 2022-02-10 MED ORDER — NURTEC 75 MG PO TBDP: 75.0000 mg | ORAL_TABLET | ORAL | 6 refills | Status: DC | PRN

## 2022-02-10 NOTE — Progress Notes (Signed)
Referring:  Preston Fleeting, [address] [place],  [place] [address]  PCP: Nicholes Rough, PA-C  Neurology was asked to evaluate Denise Conrad, a 32 year old female for a chief complaint of headaches.  Our recommendations of care will be communicated by shared medical record.    CC:  headaches  History provided from self  HPI:  Medical co-morbidities: HTN, OSA, GERD, bipolar disorder  The patient presents for evaluation of headaches which began at age 60. Migraines are associated with photophobia, phonophobia, and nausea. They can last up to 3 days at a time.  She previously followed with Ashok Pall and was receiving Botox for migraine prevention. Migraines are well-controlled with Botox. Prior to Botox she was having 3-4 migraines per week. With Botox she only has 1-2 migraines per month. Her last injection was October 2023. She currently takes baclofen and ibuprofen for rescue. She has tried Maxalt and Imitrex previously, but these were ineffective.  Headache History: Onset: age 37 Aura: blurred vision Associated Symptoms:  Photophobia: yes  Phonophobia: yes  Nausea: yes Worse with activity?: yes Duration of headaches: up to 36 hours  Migraine days per month: 2 Headache free days per month: 28  Current Treatment: Abortive Baclofen 10 mg PRN ibuprofen  Preventative Botox  Prior Therapies                                 Rescue: Maxalt 10 mg PRN - lack of efficacy Imitrex - lack of efficacy Baclofen 10 mg PRN Phenergan diclofenac  Prevention: Lyrica Lamictal Topamax - side effects Nortriptyline amitriptyline Cymbalta Effexor propranolol Botox   LABS: CBC    Component Value Date/Time   WBC 11.1 (H) 10/01/2017 1330   RBC 5.04 10/01/2017 1330   HGB 14.6 10/01/2017 1339   HCT 43.0 10/01/2017 1339   PLT 447 (H) 10/01/2017 1330   MCV 90.7 10/01/2017 1330   MCH 29.0 10/01/2017 1330   MCHC 31.9 10/01/2017 1330   RDW 14.1  10/01/2017 1330   LYMPHSABS 3.5 10/01/2017 1330   MONOABS 0.7 10/01/2017 1330   EOSABS 0.3 10/01/2017 1330   BASOSABS 0.0 10/01/2017 1330      Latest Ref Rng & Units 10/01/2017    1:39 PM 10/01/2017    1:30 PM 06/25/2016    8:48 AM  CMP  Glucose 70 - 99 mg/dL 126  126  142   BUN 6 - 20 mg/dL 8  7  8    Creatinine 0.44 - 1.00 mg/dL 0.90  0.88  1.03   Sodium 135 - 145 mmol/L 141  139  133   Potassium 3.5 - 5.1 mmol/L 3.5  3.5  5.5   Chloride 98 - 111 mmol/L 107  108  104   CO2 22 - 32 mmol/L  20  17   Calcium 8.9 - 10.3 mg/dL  9.3  8.9   Total Protein 6.5 - 8.1 g/dL  7.3  7.0   Total Bilirubin 0.3 - 1.2 mg/dL  0.5  1.6   Alkaline Phos 38 - 126 U/L  82  67   AST 15 - 41 U/L  66  50   ALT 0 - 44 U/L  83  44      IMAGING:  [facility] 10/01/2017: Mucosal thickening in several ethmoid air cells. Study otherwise unremarkable.  Imaging independently reviewed on February 10, 2022   Current Outpatient Medications on File Prior to  Visit  Medication Sig Dispense Refill   amLODipine (NORVASC) 5 MG tablet amlodipine 5 mg tablet  TAKE 1 TABLET BY MOUTH EVERY DAY     amoxicillin (AMOXIL) 875 MG tablet Take 875 mg by mouth 2 (two) times daily.     ARIPiprazole (ABILIFY) 15 MG tablet aripiprazole 15 mg tablet     ARIPiprazole (ABILIFY) 30 MG tablet Take 30 mg by mouth daily.     BOTOX 200 units SOLR      calcium carbonate (TUMS - DOSED IN MG ELEMENTAL CALCIUM) 500 MG chewable tablet Chew 1-2 tablets by mouth as needed for indigestion or heartburn.     cetirizine (ZYRTEC) 10 MG tablet Take 10 mg by mouth at bedtime.  12   clonazePAM (KLONOPIN) 0.5 MG tablet clonazepam 0.5 mg tablet     Cyanocobalamin (B-12) 1000 MCG CAPS Take 1,000 mcg by mouth daily.      fesoterodine (TOVIAZ) 8 MG TB24 tablet Take 8 mg by mouth at bedtime.     levonorgestrel (LILETTA, 52 MG,) 19.5 MCG/DAY IUD IUD 1 each by Intrauterine route once.     lisdexamfetamine (VYVANSE) 60 MG capsule Take 1 capsule (60 mg total) by mouth every  morning. 30 capsule 0   lurasidone (LATUDA) 40 MG TABS tablet Take 1 tablet (40 mg total) by mouth daily with breakfast. 30 tablet 1   lurasidone (LATUDA) 80 MG TABS tablet Take 1 tablet (80 mg total) by mouth daily with supper. 30 tablet 1   Melatonin 10 MG TABS Take 20-40 mg by mouth at bedtime as needed (for sleep).      metFORMIN (GLUCOPHAGE) 500 MG tablet Take by mouth.     modafinil (PROVIGIL) 200 MG tablet Take 1 tablet (200 mg total) by mouth daily. 30 tablet 4   pantoprazole (PROTONIX) 40 MG tablet Take 40 mg by mouth 2 (two) times daily.     pregabalin (LYRICA) 200 MG capsule Take 1 capsule by mouth 2 (two) times daily.     tirzepatide (MOUNJARO) 15 MG/0.5ML Pen INJECT 15MG  UNDER THE SKIN ONCE A WEEK     venlafaxine XR (EFFEXOR-XR) 150 MG 24 hr capsule Take 1 capsule (150 mg total) by mouth daily with breakfast. 30 capsule 1   No current facility-administered medications on file prior to visit.     Allergies: Allergies  Allergen Reactions   Saphris [Asenapine] Shortness Of Breath and Other (See Comments)    Migraines, also   Adhesive [Tape] Itching   Sulfated Tallow Sodium Salt    Topamax [Topiramate] Other (See Comments)    Gave her migraines that made her pass out   Zofran [Ondansetron] Hives   Latex Rash   Sulfa Antibiotics Rash    Family History: Migraine or other headaches in the family:  mother  Family History  Problem Relation Age of Onset   Hashimoto's thyroiditis Mother    Fibromyalgia Mother    Hypertension Father    Heart failure Father        hyptertensive cardiovascular disease   Schizophrenia Father    Suicidality Maternal Grandmother    Bipolar disorder Paternal Grandmother      Past Medical History: Past Medical History:  Diagnosis Date   Acute renal failure (HCC) 11/22/2014   Acute suppurative tonsillitis 11/20/2014   Anxiety    Bipolar 1 disorder ([facility]CC)    Depression    Fibromyalgia    Hypertensive disorder 08/27/2020   Liver disease     tumor on liver   Sepsis ([facility]CC)  11/20/2014    Past Surgical History Past Surgical History:  Procedure Laterality Date   GINGIVECTOMY  10/2021   IUD INSERTION  05/16/2019       TEMPOROMANDIBULAR JOINT ARTHROPLASTY  2015   TONSILLECTOMY     WISDOM TOOTH EXTRACTION      Social History: Social History   Tobacco Use   Smoking status: Never   Smokeless tobacco: Never  Vaping Use   Vaping Use: Never used  Substance Use Topics   Alcohol use: No   Drug use: No     ROS: Negative for fevers, chills. Positive for headaches. All other systems reviewed and negative unless stated otherwise in HPI.   Physical Exam:   Vital Signs: BP 111/81   Pulse 99   Ht 5\' 6"  (1.676 m)   Wt 294 lb (133.4 kg)   BMI 47.45 kg/m  GENERAL: well appearing,in no acute distress,alert SKIN:  Color, texture, turgor normal. No rashes or lesions HEAD:  Normocephalic/atraumatic. CV:  RRR RESP: Normal respiratory effort MSK: no tenderness to palpation over occiput, neck, or shoulders  NEUROLOGICAL: Mental Status: Alert, oriented to person, place and time,Follows commands Cranial Nerves: PERRL, visual fields intact to confrontation, extraocular movements intact, facial sensation intact, no facial droop or ptosis, hearing grossly intact, no dysarthria Motor: muscle strength 5/5 both upper and lower extremities,no drift, normal tone Reflexes: 2+ throughout Sensation: intact to light touch all 4 extremities Coordination: Finger-to- nose-finger intact bilaterally Gait: normal-based   IMPRESSION: 32 year old female with a history of HTN, OSA, GERD, bipolar disorder who presents for transfer of care for chronic migraines. Her migraines are currently well-controlled on Botox. Will continue Botox and PRN baclofen. She has failed multiple triptans due to lack of efficacy. Will start Nurtec for migraine rescue.  PLAN: -Prevention: Continue Botox, will start process to set this up in our clinic -Rescue: Start  Nurtec 75 mg PRN. Continue baclofen 10 mg TID PRN   I spent a total of 20 minutes chart reviewing and counseling the patient. Headache education was done. Discussed treatment options including preventive and acute medications. Discussed medication side effects, adverse reactions and drug interactions. Written educational materials and patient instructions outlining all of the above were given.  Follow-up: for Botox   Genia Harold, MD 02/10/2022   11:19 AM

## 2022-02-11 ENCOUNTER — Telehealth: Payer: Self-pay | Admitting: Neurology

## 2022-02-11 ENCOUNTER — Other Ambulatory Visit (HOSPITAL_COMMUNITY): Payer: Self-pay

## 2022-02-11 NOTE — Telephone Encounter (Signed)
MD would like to start treatment with Botox for the patient. PA needed for botox. J CODE: [id number] CPT: 68127 NTZ:G01.749 Will send to the PA team to initiate the process

## 2022-02-11 NOTE — Telephone Encounter (Signed)
   Per test billing PA not needed at this time-Can be filled with [facility].

## 2022-02-17 ENCOUNTER — Other Ambulatory Visit: Payer: Self-pay

## 2022-02-17 ENCOUNTER — Other Ambulatory Visit (HOSPITAL_COMMUNITY): Payer: Self-pay

## 2022-02-17 MED ORDER — BOTOX 200 UNITS IJ SOLR
200.0000 [IU] | INTRAMUSCULAR | 1 refills | Status: DC
  Filled 2022-02-17: qty 1, 34d supply, fill #0
  Filled 2022-02-17: qty 1, 90d supply, fill #0
  Filled 2022-06-05: qty 1, 34d supply, fill #1

## 2022-02-17 NOTE — Telephone Encounter (Signed)
LVM asking pt to call back and schedule first botox appt. If she calls back, please schedule her 4 weeks or so out so we can get medication in from the pharmacy.

## 2022-02-17 NOTE — Addendum Note (Signed)
Addended by: Darleen Crocker on: 02/17/2022 11:14 AM   Modules accepted: Orders

## 2022-02-25 ENCOUNTER — Other Ambulatory Visit (HOSPITAL_COMMUNITY): Payer: Self-pay

## 2022-03-03 ENCOUNTER — Other Ambulatory Visit: Payer: Self-pay

## 2022-03-04 ENCOUNTER — Telehealth: Payer: Self-pay | Admitting: Pharmacy Technician

## 2022-03-04 NOTE — Telephone Encounter (Signed)
Patient Advocate Encounter   Received notification that prior authorization for Nurtec 75MG  dispersible tablets is required.   PA submitted on 03/04/2022 Key [id number]  Status is pending       [name], [place] Patient Advocate Specialist [place] Patient Advocate Team Direct Number: [phone]  Fax: [phone]

## 2022-03-05 NOTE — Telephone Encounter (Signed)
Patient Advocate Encounter  Prior Authorization for Nurtec 75MG  dispersible tablets has been approved.    PA# [id number] Effective dates: 03/04/2022 through 03/05/2023      [name], [place] Patient Advocate Specialist [place] Patient Advocate Team Direct Number: [phone]  Fax: [phone]

## 2022-03-13 ENCOUNTER — Other Ambulatory Visit: Payer: Self-pay

## 2022-03-16 ENCOUNTER — Other Ambulatory Visit: Payer: Self-pay

## 2022-03-16 ENCOUNTER — Other Ambulatory Visit (HOSPITAL_COMMUNITY): Payer: Self-pay

## 2022-03-17 ENCOUNTER — Other Ambulatory Visit (HOSPITAL_COMMUNITY): Payer: Self-pay

## 2022-03-23 ENCOUNTER — Encounter: Payer: Self-pay | Admitting: Adult Health

## 2022-03-23 ENCOUNTER — Ambulatory Visit: Payer: Medicaid Other | Admitting: Adult Health

## 2022-03-23 DIAGNOSIS — G43719 Chronic migraine without aura, intractable, without status migrainosus: Secondary | ICD-10-CM | POA: Diagnosis not present

## 2022-03-23 MED ORDER — ONABOTULINUMTOXINA 200 UNITS IJ SOLR
155.0000 [IU] | Freq: Once | INTRAMUSCULAR | Status: AC
  Administered 2022-03-23: 155 [IU] via INTRAMUSCULAR

## 2022-03-23 MED ORDER — ONABOTULINUMTOXINA 100 UNITS IJ SOLR: 100.0000 [IU] | Freq: Once | INTRAMUSCULAR | Status: AC

## 2022-03-23 NOTE — Progress Notes (Signed)
Botox- 200 units x 1 vial Lot: [id number] Expiration: 03/2024 NDC: [id number]  Bacteriostatic 0.9% Sodium Chloride- 44m total Lot: [id number] Expiration: 11/25 NDC: [id number] Dx: [id number]/P (pt to still be billed for s/p pharmacy)   (I had to use sample for patient botox. 200UNIT Lot [id number] 03/2024)

## 2022-03-23 NOTE — Progress Notes (Signed)
Update 03/23/2022 JM: Patient is being seen for initial Botox injection in this office. Was receiving Botox from outside office previously, last injection 10/2021.  Does report slight increase in migraine frequency due to duration between injections.  Use of Nurtec for rescue with great benefit.  Tolerated procedure well today.  Will return in 3 months for repeat injection.     Consent Form Botulism Toxin Injection For Chronic Migraine    Reviewed orally with patient, additionally signature is on file:  Botulism toxin has been approved by the Federal drug administration for treatment of chronic migraine. Botulism toxin does not cure chronic migraine and it may not be effective in some patients.  The administration of botulism toxin is accomplished by injecting a small amount of toxin into the muscles of the neck and head. Dosage must be titrated for each individual. Any benefits resulting from botulism toxin tend to wear off after 3 months with a repeat injection required if benefit is to be maintained. Injections are usually done every 3-4 months with maximum effect peak achieved by about 2 or 3 weeks. Botulism toxin is expensive and you should be sure of what costs you will incur resulting from the injection.  The side effects of botulism toxin use for chronic migraine may include:   -Transient, and usually mild, facial weakness with facial injections  -Transient, and usually mild, head or neck weakness with head/neck injections  -Reduction or loss of forehead facial animation due to forehead muscle weakness  -Eyelid drooping  -Dry eye  -Pain at the site of injection or bruising at the site of injection  -Double vision  -Potential unknown long term risks   Contraindications: You should not have Botox if you are pregnant, nursing, allergic to albumin, have an infection, skin condition, or muscle weakness at the site of the injection, or have myasthenia gravis, Lambert-Eaton  syndrome, or ALS.  It is also possible that as with any injection, there may be an allergic reaction or no effect from the medication. Reduced effectiveness after repeated injections is sometimes seen and rarely infection at the injection site may occur. All care will be taken to prevent these side effects. If therapy is given over a long time, atrophy and wasting in the muscle injected may occur. Occasionally the patient's become refractory to treatment because they develop antibodies to the toxin. In this event, therapy needs to be modified.  I have read the above information and consent to the administration of botulism toxin.    BOTOX PROCEDURE NOTE FOR MIGRAINE HEADACHE  Contraindications and precautions discussed with patient(above). Aseptic procedure was observed and patient tolerated procedure. Procedure performed by Frann Rider, AGNP-BC.   The condition has existed for more than 6 months, and pt does not have a diagnosis of ALS, Myasthenia Gravis or Lambert-Eaton Syndrome.  Risks and benefits of injections discussed and pt agrees to proceed with the procedure.  Written consent obtained  These injections are medically necessary. Pt  receives good benefits from these injections. These injections do not cause sedations or hallucinations which the oral therapies may cause.   Description of procedure:  The patient was placed in a sitting position. The standard protocol was used for Botox as follows, with 5 units of Botox injected at each site:  -Procerus muscle, midline injection  -Corrugator muscle, bilateral injection  -Frontalis muscle, bilateral injection, with 2 sites each side, medial injection was performed in the upper one third of the frontalis muscle, in the region  vertical from the medial inferior edge of the superior orbital rim. The lateral injection was again in the upper one third of the forehead vertically above the lateral limbus of the cornea, 1.5 cm lateral to the  medial injection site.  -Temporalis muscle injection, 4 sites, bilaterally. The first injection was 3 cm above the tragus of the ear, second injection site was 1.5 cm to 3 cm up from the first injection site in line with the tragus of the ear. The third injection site was 1.5-3 cm forward between the first 2 injection sites. The fourth injection site was 1.5 cm posterior to the second injection site. 5th site laterally in the temporalis  muscleat the level of the outer canthus.  -Occipitalis muscle injection, 3 sites, bilaterally. The first injection was done one half way between the occipital protuberance and the tip of the mastoid process behind the ear. The second injection site was done lateral and superior to the first, 1 fingerbreadth from the first injection. The third injection site was 1 fingerbreadth superiorly and medially from the first injection site.  -Cervical paraspinal muscle injection, 2 sites, bilaterally. The first injection site was 1 cm from the midline of the cervical spine, 3 cm inferior to the lower border of the occipital protuberance. The second injection site was 1.5 cm superiorly and laterally to the first injection site.  -Trapezius muscle injection was performed at 3 sites, bilaterally. The first injection site was in the upper trapezius muscle halfway between the inflection point of the neck, and the acromion. The second injection site was one half way between the acromion and the first injection site. The third injection was done between the first injection site and the inflection point of the neck.    A total of 200 units of Botox was prepared, 155 units of Botox was injected as documented above, any Botox not injected was wasted. The patient tolerated the procedure well, there were no complications of the above procedure.   Frann Rider, AGNP-BC  [facility] [address] [place], [place] [address]  Phone [phone] Fax  [phone] Note: This document was prepared with digital dictation and possible smart phrase technology. Any transcriptional errors that result from this process are unintentional.

## 2022-03-24 ENCOUNTER — Other Ambulatory Visit: Payer: Self-pay | Admitting: Physician Assistant

## 2022-03-24 DIAGNOSIS — G4711 Idiopathic hypersomnia with long sleep time: Secondary | ICD-10-CM

## 2022-03-24 MED ORDER — MODAFINIL 200 MG PO TABS: 200.0000 mg | ORAL_TABLET | Freq: Every day | ORAL | 2 refills | Status: DC

## 2022-05-12 NOTE — Progress Notes (Signed)
Sleep Medicine   Office Visit  Patient Name: Denise Conrad DOB: January 15, 1991 MRN [id number]    Chief Complaint: OSA   HISTORY OF PRESENT ILLNESS: Denise Conrad is seen today for follow up for CPAP@ 11 cmH2O and for Provigil. The patient has been taking  modafinil. The patient reports symptoms have improved since starting medication. Sleep quality is improved since starting medication and the patient has reduced daytime sleepiness. Patient did go without her medication for almost 2 months while awaiting prior auth approval for refills and her excessive sleepiness returned. She feels much better being back on the medication again. The Epworth Sleepiness Score is 9. The patient reports no side effects from the medication. The patient is currently on a CPAP@ 11 cmH2O. The patient reports using her PAP and feels rested after sleeping with PAP.  The patient is in need of a replacement unit as her motor has exceeded its lifetime expectancy. The patient reports benefiting from PAP use and would like for her to continue using PAP. Reported sleepiness is  improved. The compliance download shows  99% compliance with an average use time of 13 hours 26  minutes. The AHI is 0.7.  The patient continues to require PAP therapy as a medical necessity in order to eliminate her sleep apnea. Prior to CPAP she had snoring, gasping, witnessed apneas, and excessive daytime sleepiness.   ROS  General: (-) fever, (-) chills, (-) night sweat Nose and Sinuses: (-) nasal stuffiness or itchiness, (-) postnasal drip, (-) nosebleeds, (-) sinus trouble. Mouth and Throat: (-) sore throat, (-) hoarseness. Neck: (-) swollen glands, (-) enlarged thyroid, (-) neck pain. Respiratory: - cough, - shortness of breath, - wheezing. Neurologic: - numbness, - tingling. Psychiatric: + anxiety, + depression    Current Medication: Outpatient Encounter Medications as of 05/13/2022  Medication Sig   amLODipine (NORVASC) 5 MG tablet  amlodipine 5 mg tablet  TAKE 1 TABLET BY MOUTH EVERY DAY   ARIPiprazole (ABILIFY) 15 MG tablet aripiprazole 15 mg tablet   baclofen (LIORESAL) 10 MG tablet Take 1 tablet (10 mg total) by mouth 3 (three) times daily as needed for muscle spasms.   BOTOX 200 units injection Inject 200 Units into the muscle every 3 (three) months. Inject 155 units into head and neck muscles for migraine treatment   calcium carbonate (TUMS - DOSED IN MG ELEMENTAL CALCIUM) 500 MG chewable tablet Chew 1-2 tablets by mouth as needed for indigestion or heartburn.   cetirizine (ZYRTEC) 10 MG tablet Take 10 mg by mouth at bedtime.   clonazePAM (KLONOPIN) 0.5 MG tablet clonazepam 0.5 mg tablet   Cyanocobalamin (B-12) 1000 MCG CAPS Take 1,000 mcg by mouth daily.    fesoterodine (TOVIAZ) 8 MG TB24 tablet Take 8 mg by mouth at bedtime.   levonorgestrel (LILETTA, 52 MG,) 19.5 MCG/DAY IUD IUD 1 each by Intrauterine route once.   lisdexamfetamine (VYVANSE) 60 MG capsule Take 1 capsule (60 mg total) by mouth every morning.   lurasidone (LATUDA) 40 MG TABS tablet Take 1 tablet (40 mg total) by mouth daily with breakfast.   lurasidone (LATUDA) 80 MG TABS tablet Take 1 tablet (80 mg total) by mouth daily with supper.   Melatonin 10 MG TABS Take 20-40 mg by mouth at bedtime as needed (for sleep).    modafinil (PROVIGIL) 200 MG tablet Take 1 tablet (200 mg total) by mouth daily.   pantoprazole (PROTONIX) 40 MG tablet Take 40 mg by mouth 2 (two) times daily.   pregabalin (LYRICA)  200 MG capsule Take 1 capsule by mouth 2 (two) times daily.   Rimegepant Sulfate (NURTEC) 75 MG TBDP Take 75 mg by mouth as needed.   tirzepatide (MOUNJARO) 15 MG/0.5ML Pen INJECT  UNDER THE SKIN ONCE A WEEK   Facility-Administered Encounter Medications as of 05/13/2022  Medication   botulinum toxin Type A (BOTOX) injection 100 Units    Surgical History: Past Surgical History:  Procedure Laterality Date   GINGIVECTOMY  10/2021   IUD INSERTION   05/16/2019       TEMPOROMANDIBULAR JOINT ARTHROPLASTY  2015   TONSILLECTOMY     WISDOM TOOTH EXTRACTION      Medical History: Past Medical History:  Diagnosis Date   Acute renal failure 11/22/2014   Acute suppurative tonsillitis 11/20/2014   Anxiety    Bipolar 1 disorder    Depression    Fibromyalgia    Hypertensive disorder 08/27/2020   Liver disease    tumor on liver   Sepsis 11/20/2014    Family History: Non contributory to the present illness  Social History: Social History   Socioeconomic History   Marital status: Single    Spouse name: Not on file   Number of children: Not on file   Years of education: Not on file   Highest education level: Not on file  Occupational History   Not on file  Tobacco Use   Smoking status: Never   Smokeless tobacco: Never  Vaping Use   Vaping Use: Never used  Substance and Sexual Activity   Alcohol use: No   Drug use: No   Sexual activity: Yes    Partners: Male  Other Topics Concern   Not on file  Social History Narrative   Not on file   Social Determinants of Health   Financial Resource Strain: Not on file  Food Insecurity: Not on file  Transportation Needs: Not on file  Physical Activity: Not on file  Stress: Not on file  Social Connections: Not on file  Intimate Partner Violence: Not on file    Vital Signs: Blood pressure 111/86, pulse (!) 104, resp. rate 18, height  (1.676 m), weight 294 lb (133.4 kg), SpO2 99 %. Body mass index is 47.45 kg/m.   Examination: General Appearance: The patient is well-developed, well-nourished, and in no distress. Neck Circumference: 44 cm Skin: Gross inspection of skin unremarkable. Head: normocephalic, no gross deformities. Eyes: no gross deformities noted. ENT: ears appear grossly normal Neurologic: Alert and oriented. No involuntary movements.    STOP BANG RISK ASSESSMENT S (snore) Have you been told that you snore?     NO   T (tired) Are you often tired,  fatigued, or sleepy during the day?   YES  O (obstruction) Do you stop breathing, choke, or gasp during sleep? NO   P (pressure) Do you have or are you being treated for high blood pressure? YES   B (BMI) Is your body index greater than 35 kg/m? YES   A (age) Are you 37 years old or older? NO   N (neck) Do you have a neck circumference greater than 16 inches?   YES   G (gender) Are you a female? NO   TOTAL STOP/BANG "YES" ANSWERS 4  A STOP-Bang score of 2 or less is considered low risk, and a score of 5 or more is high risk for having either moderate or severe OSA. For people who score 3 or 4, doctors may need to perform further assessment to determine how likely they are to have OSA.          EPWORTH SLEEPINESS SCALE:  Scale:  (0)= no chance of dozing; (1)= slight chance of dozing; (2)= moderate chance of dozing; (3)= high chance of dozing  Chance  Situtation    Sitting and reading: 2    Watching TV: 1    Sitting Inactive in public: 0    As a passenger in car: 2      Lying down to rest: 3    Sitting and talking: 0    Sitting quielty after lunch: 1    In a car, stopped in traffic: 0   TOTAL SCORE:   9 out of 24    SLEEP STUDIES:  Not available. Pt is requesting previous provider/sleep center to fax sleep studies over.    CPAP COMPLIANCE DATA:  Date Range: 05/13/2021-05/12/2022  Average Daily Use: 13 hours 26 minutes  Median Use: 13 hours 13 minutes  Compliance for > 4 Hours: 99 %  AHI: 0.7 respiratory events per hour  Days Used: 364/365 days  Mask Leak: 13.9  95th Percentile Pressure: 11    LABS: No results found for this or any previous visit (from the past 2160 hour(s)).  Radiology: No results found.  No results found.  No results found.    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   Noncompliance with treatment plan 04/02/2020   High risk medication use 11/02/2019   Bipolar 1 disorder, mixed, moderate 09/26/2019   PTSD (post-traumatic stress disorder) 09/26/2019   Attention deficit hyperactivity disorder (ADHD), predominantly inattentive type 09/26/2019   Sleep disorder 09/26/2019   Panic attacks 09/26/2019   Hepatic steatosis 05/26/2019   Vitamin D deficiency 12/05/2018   Gastro-esophageal reflux disease without esophagitis 02/18/2018   IFG (impaired fasting glucose) 05/30/2017   Acne vulgaris 05/28/2017   Chronic nasopharyngitis 03/05/2017   Lumbosacral spondylosis without myelopathy 08/20/2016   Chlamydia infection 07/31/2016   Pap smear of cervix shows low risk HPV present 07/31/2016   Obstructive sleep apnea syndrome, moderate 04/28/2016   Iron deficiency 04/20/2016   Fibromyalgia 03/31/2016   Chronic bilateral low back pain without sciatica 02/21/2016   Family history of Hashimoto thyroiditis 02/21/2016   Morbid obesity with BMI of 50.0-59.9, adult 02/21/2016   Scoliosis 02/21/2016   Bipolar 1 disorder, depressed, severe 08/19/2015   Anxiety 11/20/2014   Arthropathy of lumbar facet joint 05/24/2013     PLAN OSA:  Pt is tolerating and benefiting from cpap use. Compliance is excellent and AHI is controlled at 0.7. No Psg is on file as her testing and supplies were through an outside company. She is going to request this be sent over to [place]. She understands if this is not obtained then an updated sleep study would be required in order to move forward with replacement. Pt continues to require cpap to treat her apnea and is medically necessary. The patient's machine is past end of life and must be replaced.   1. OSA (obstructive sleep  apnea)  continue excellent compliance. Follow up 30+ days after set up  2.  Idiopathic hypersomnia May continue provigil daily. Has available refills currently.  3. Bipolar 1 disorder, depressed, severe Followed by psych  4. Anxiety Followed by psych  5. Morbid obesity with BMI of 45.0-49.9, adult Obesity Counseling: Had a lengthy discussion regarding patients BMI and weight issues. Patient was instructed on portion control as well as increased activity. Also discussed caloric restrictions with trying to maintain intake less than 2000 Kcal. Discussions were made in accordance with the 5As of weight management. Simple actions such as not eating late and if able to, taking a walk is suggested.    General Counseling: I have discussed the findings of the evaluation and examination with Denise Conrad.  I have also discussed any further diagnostic evaluation thatmay be needed or ordered today. Denise Conrad verbalizes understanding of the findings of todays visit. We also reviewed her medications today and discussed drug interactions and side effects including but not limited excessive drowsiness and altered mental states. We also discussed that there is always a risk not just to her but also people around her. she has been encouraged to call the office with any questions or concerns that should arise related to todays visit.  No orders of the defined types were placed in this encounter.       I have personally obtained a history, evaluated the patient, evaluated pertinent data, formulated the assessment and plan and placed orders.  This patient was seen by Lynn Ito, PA-C in collaboration with Dr. Freda Munro as a part of collaborative care agreement.   Yevonne Pax, MD [facility] Diplomate ABMS Pulmonary Critical Care Medicine and Sleep medicine

## 2022-05-13 ENCOUNTER — Ambulatory Visit (INDEPENDENT_AMBULATORY_CARE_PROVIDER_SITE_OTHER): Payer: Medicaid Other | Admitting: Internal Medicine

## 2022-05-13 VITALS — BP 111/86 | HR 104 | Resp 18 | Ht 66.0 in | Wt 294.0 lb

## 2022-05-13 DIAGNOSIS — F419 Anxiety disorder, unspecified: Secondary | ICD-10-CM

## 2022-05-13 DIAGNOSIS — F314 Bipolar disorder, current episode depressed, severe, without psychotic features: Secondary | ICD-10-CM | POA: Diagnosis not present

## 2022-05-13 DIAGNOSIS — G4711 Idiopathic hypersomnia with long sleep time: Secondary | ICD-10-CM

## 2022-05-13 DIAGNOSIS — Z6841 Body Mass Index (BMI) 40.0 and over, adult: Secondary | ICD-10-CM

## 2022-05-13 DIAGNOSIS — G4733 Obstructive sleep apnea (adult) (pediatric): Secondary | ICD-10-CM | POA: Diagnosis not present

## 2022-05-13 NOTE — Patient Instructions (Signed)

## 2022-06-01 NOTE — Telephone Encounter (Signed)
Received fax from [facility] of Botox approval: auth: [id number] (06/27/22-06/27/23).

## 2022-06-05 ENCOUNTER — Other Ambulatory Visit (HOSPITAL_COMMUNITY): Payer: Self-pay

## 2022-06-19 ENCOUNTER — Other Ambulatory Visit (HOSPITAL_COMMUNITY): Payer: Self-pay

## 2022-06-23 ENCOUNTER — Ambulatory Visit: Payer: Medicaid Other | Admitting: Adult Health

## 2022-06-29 ENCOUNTER — Ambulatory Visit: Payer: Medicaid Other | Admitting: Adult Health

## 2022-08-05 ENCOUNTER — Telehealth: Payer: Self-pay | Admitting: Internal Medicine

## 2022-08-05 NOTE — Telephone Encounter (Signed)
07/26/21-07/26/22 MR uploaded to [email or web address] 

## 2022-08-13 ENCOUNTER — Ambulatory Visit: Payer: Medicaid Other | Admitting: Adult Health

## 2022-08-13 DIAGNOSIS — G43719 Chronic migraine without aura, intractable, without status migrainosus: Secondary | ICD-10-CM | POA: Diagnosis not present

## 2022-08-13 MED ORDER — ONABOTULINUMTOXINA 200 UNITS IJ SOLR
155.0000 [IU] | Freq: Once | INTRAMUSCULAR | Status: AC
  Administered 2022-08-13: 155 [IU] via INTRAMUSCULAR

## 2022-08-13 NOTE — Progress Notes (Signed)
Botox- 200 units x 1 vial Lot: [id number] Expiration: 06/2024 NDC: 4540-9811-91  Bacteriostatic 0.9% Sodium Chloride- 4 mL  Lot: [id number] Expiration: 11/27/2023 NDC: 6213-0865-78  Dx: I69.629 SP Witnessed by Alveria Apley CMA

## 2022-08-13 NOTE — Progress Notes (Signed)
08/13/22: reports Botox is working well however she had to delay her injection so she is having more breakthrough headaches.  Continues to use Nurtec for abortive therapy  Update 03/23/2022 JM: Patient is being seen for initial Botox injection in this office. Was receiving Botox from outside office previously, last injection 10/2021.  Does report slight increase in migraine frequency due to duration between injections.  Use of Nurtec for rescue with great benefit.  Tolerated procedure well today.  Will return in 3 months for repeat injection    BOTOX PROCEDURE NOTE FOR MIGRAINE HEADACHE    Contraindications and precautions discussed with patient(above). Aseptic procedure was observed and patient tolerated procedure. Procedure performed by Butch Penny, NP  The condition has existed for more than 6 months, and pt does not have a diagnosis of ALS, Myasthenia Gravis or Lambert-Eaton Syndrome.  Risks and benefits of injections discussed and pt agrees to proceed with the procedure.  Written consent obtained  Indication/Diagnosis: chronic migraine BOTOX(J0585) injection was performed according to protocol by [employer]. 200 units of BOTOX was dissolved into 4 cc NS.   NDC: [address]-14  Type of toxin: Botox  Botox- 200 units x 1 vial Lot: [id number] Expiration: 06/2024 NDC: 1308-6578-46   Bacteriostatic 0.9% Sodium Chloride- 4 mL  Lot: [id number] Expiration: 11/27/2023 NDC: 8413-2440-10   Dx: U72.536   Description of procedure:  The patient was placed in a sitting position. The standard protocol was used for Botox as follows, with 5 units of Botox injected at each site:   -Procerus muscle, midline injection  -Corrugator muscle, bilateral injection  -Frontalis muscle, bilateral injection, with 2 sites each side, medial injection was performed in the upper one third of the frontalis muscle, in the region vertical from the medial inferior edge of the superior orbital rim. The lateral  injection was again in the upper one third of the forehead vertically above the lateral limbus of the cornea, 1.5 cm lateral to the medial injection site.  -Temporalis muscle injection, 4 sites, bilaterally. The first injection was 3 cm above the tragus of the ear, second injection site was 1.5 cm to 3 cm up from the first injection site in line with the tragus of the ear. The third injection site was 1.5-3 cm forward between the first 2 injection sites. The fourth injection site was 1.5 cm posterior to the second injection site.  -Occipitalis muscle injection, 3 sites, bilaterally. The first injection was done one half way between the occipital protuberance and the tip of the mastoid process behind the ear. The second injection site was done lateral and superior to the first, 1 fingerbreadth from the first injection. The third injection site was 1 fingerbreadth superiorly and medially from the first injection site.  -Cervical paraspinal muscle injection, 2 sites, bilateral knee first injection site was 1 cm from the midline of the cervical spine, 3 cm inferior to the lower border of the occipital protuberance. The second injection site was 1.5 cm superiorly and laterally to the first injection site.  -Trapezius muscle injection was performed at 3 sites, bilaterally. The first injection site was in the upper trapezius muscle halfway between the inflection point of the neck, and the acromion. The second injection site was one half way between the acromion and the first injection site. The third injection was done between the first injection site and the inflection point of the neck.   Will return for repeat injection in 3 months.   A 200 unit sof Botox  was used, 155 units were injected, the rest of the Botox was wasted. The patient tolerated the procedure well, there were no complications of the above procedure.  Butch Penny, MSN, NP-C 08/13/2022, 3:05 PM [facility] [address], [address] [place], [place] [address] [phone]

## 2022-08-21 ENCOUNTER — Other Ambulatory Visit: Payer: Self-pay | Admitting: Physician Assistant

## 2022-08-21 DIAGNOSIS — G4711 Idiopathic hypersomnia with long sleep time: Secondary | ICD-10-CM

## 2022-08-25 NOTE — Progress Notes (Signed)
[facility] [address] [place], [place] [address]  Pulmonary Sleep Medicine   Office Visit Note  Patient Name: Denise Conrad DOB: August 19, 1990 MRN [id number]    Chief Complaint: Obstructive Sleep Apnea visit  Brief History:  Denise Conrad is seen today for a follow up visit for CPAP@ 11 cmH2O. The patient has a 6 year history of sleep apnea. Patient is using PAP nightly.  The patient feels rested after sleeping with PAP.  The patient reports benefiting from PAP use. Reported sleepiness is  improved and the Epworth Sleepiness Score is 9 out of 24. The patient does not take naps. The patient complains of the following: none.  The compliance download shows 98% compliance with an average use time of 13 hours 34 minutes. The AHI is 0.9.  The patient has occasional limb movements disrupting sleep. The patient continues to require PAP therapy in order to eliminate sleep apnea. Continues to take modafinil 200mg  daily and is helping with daytime sleepiness. Denies any side effects from this.  ROS  General: (-) fever, (-) chills, (-) night sweat Nose and Sinuses: (-) nasal stuffiness or itchiness, (-) postnasal drip, (-) nosebleeds, (-) sinus trouble. Mouth and Throat: (-) sore throat, (-) hoarseness. Neck: (-) swollen glands, (-) enlarged thyroid, (-) neck pain. Respiratory: - cough, - shortness of breath, - wheezing. Neurologic: + numbness, + tingling. Psychiatric: + anxiety, - depression   Current Medication: Outpatient Encounter Medications as of 08/26/2022  Medication Sig Note   Lurasidone HCl 120 MG TABS Take 1 tablet by mouth every morning.    ARIPiprazole (ABILIFY) 15 MG tablet aripiprazole 15 mg tablet    baclofen (LIORESAL) 10 MG tablet Take 1 tablet (10 mg total) by mouth 3 (three) times daily as needed for muscle spasms.    BOTOX 200 units injection Inject 200 Units into the muscle every 3 (three) months. Inject 155 units into head and neck muscles for migraine  treatment    calcium carbonate (TUMS - DOSED IN MG ELEMENTAL CALCIUM) 500 MG chewable tablet Chew 1-2 tablets by mouth as needed for indigestion or heartburn.    cetirizine (ZYRTEC) 10 MG tablet Take 10 mg by mouth at bedtime.    clonazePAM (KLONOPIN) 0.5 MG tablet clonazepam 0.5 mg tablet    Cyanocobalamin (B-12) 1000 MCG CAPS Take 1,000 mcg by mouth daily.     fesoterodine (TOVIAZ) 8 MG TB24 tablet Take 8 mg by mouth at bedtime.    levonorgestrel (LILETTA, 52 MG,) 19.5 MCG/DAY IUD IUD 1 each by Intrauterine route once.    lisdexamfetamine (VYVANSE) 60 MG capsule Take 1 capsule (60 mg total) by mouth every morning.    lisinopril (ZESTRIL) 20 MG tablet Take 20 mg by mouth daily.    Melatonin 10 MG TABS Take 20-40 mg by mouth at bedtime as needed (for sleep).     modafinil (PROVIGIL) 200 MG tablet TAKE 1 TABLET(200 MG) BY MOUTH DAILY    pantoprazole (PROTONIX) 40 MG tablet Take 40 mg by mouth 2 (two) times daily.    pregabalin (LYRICA) 200 MG capsule Take 1 capsule by mouth 2 (two) times daily.    Rimegepant Sulfate (NURTEC) 75 MG TBDP Take 75 mg by mouth as needed.    tirzepatide (MOUNJARO) 15 MG/0.5ML Pen INJECT 15MG  UNDER THE SKIN ONCE A WEEK    tirzepatide (MOUNJARO) 7.5 MG/0.5ML Pen Inject 7.5 mg into the skin once a week. 08/13/2022: Had to restart. She is on 7.5 for now until dose increases again.   [  DISCONTINUED] lurasidone (LATUDA) 40 MG TABS tablet Take 1 tablet (40 mg total) by mouth daily with breakfast.    [DISCONTINUED] lurasidone (LATUDA) 80 MG TABS tablet Take 1 tablet (80 mg total) by mouth daily with supper.    Facility-Administered Encounter Medications as of 08/26/2022  Medication   botulinum toxin Type A (BOTOX) injection 100 Units    Surgical History: Past Surgical History:  Procedure Laterality Date   GINGIVECTOMY  10/2021   IUD INSERTION  05/16/2019       TEMPOROMANDIBULAR JOINT ARTHROPLASTY  2015   TONSILLECTOMY     WISDOM TOOTH EXTRACTION      Medical  History: Past Medical History:  Diagnosis Date   Acute renal failure (HCC) 11/22/2014   Acute suppurative tonsillitis 11/20/2014   Anxiety    Bipolar 1 disorder (HCC)    Depression    Fibromyalgia    Hypertensive disorder 08/27/2020   Liver disease    tumor on liver   Sepsis (HCC) 11/20/2014    Family History: Non contributory to the present illness  Social History: Social History   Socioeconomic History   Marital status: Single    Spouse name: Not on file   Number of children: Not on file   Years of education: Not on file   Highest education level: Not on file  Occupational History   Not on file  Tobacco Use   Smoking status: Never   Smokeless tobacco: Never  Vaping Use   Vaping status: Never Used  Substance and Sexual Activity   Alcohol use: No   Drug use: No   Sexual activity: Yes    Partners: Male  Other Topics Concern   Not on file  Social History Narrative   Not on file   Social Determinants of Health   Financial Resource Strain: Not on file  Food Insecurity: No Food Insecurity (06/12/2021)   Received from [facility], [facility]   Hunger Vital Sign    Worried About Running Out of Food in the Last Year: Never true    Ran Out of Food in the Last Year: Never true  Transportation Needs: Not on file  Physical Activity: Not on file  Stress: Not on file  Social Connections: Unknown (05/25/2021)   Received from [facility], [facility]   Social Network    Social Network: Not on file  Intimate Partner Violence: Unknown (04/29/2021)   Received from [facility], [facility]   HITS    Physically Hurt: Not on file    Insult or Talk Down To: Not on file    Threaten Physical Harm: Not on file    Scream or Curse: Not on file    Vital Signs: Blood pressure 112/68, pulse (!) 109, resp. rate 18, height 5\' 6"  (1.676 m), weight 299 lb (135.6 kg), SpO2 95%. Body mass index is 48.26 kg/m.    Examination: General Appearance: The patient is  well-developed, well-nourished, and in no distress. Neck Circumference: 44 cm Skin: Gross inspection of skin unremarkable. Head: normocephalic, no gross deformities. Eyes: no gross deformities noted. ENT: ears appear grossly normal Neurologic: Alert and oriented. No involuntary movements.  STOP BANG RISK ASSESSMENT S (snore) Have you been told that you snore?     NO   T (tired) Are you often tired, fatigued, or sleepy during the day?   NO  O (obstruction) Do you stop breathing, choke, or gasp during sleep? NO   P (pressure) Do you have or are you being treated for  high blood pressure? YES   B (BMI) Is your body index greater than 35 kg/m? YES   A (age) Are you 24 years old or older? NO   N (neck) Do you have a neck circumference greater than 16 inches?   YES   G (gender) Are you a female? NO   TOTAL STOP/BANG "YES" ANSWERS 3       A STOP-Bang score of 2 or less is considered low risk, and a score of 5 or more is high risk for having either moderate or severe OSA. For people who score 3 or 4, doctors may need to perform further assessment to determine how likely they are to have OSA.         EPWORTH SLEEPINESS SCALE:  Scale:  (0)= no chance of dozing; (1)= slight chance of dozing; (2)= moderate chance of dozing; (3)= high chance of dozing  Chance  Situtation    Sitting and reading: 2    Watching TV: 1    Sitting Inactive in public: 0    As a passenger in car: 2      Lying down to rest: 3    Sitting and talking: 0    Sitting quielty after lunch: 1    In a car, stopped in traffic: 0   TOTAL SCORE:   9 out of 24    SLEEP STUDIES:  PSG (03/2016) AHI 25/hr, min SpO2 84% Titration (03/2016) CPAP@ 13 cmH2O MSLT/MWT (11/2018) Mean sleep latency for 4 naps was 12.9 Minutes. REM was noted 0 times.  PSG (11/2018) AHI 0.6/hr. Did not demonstrate significant OSA.   CPAP COMPLIANCE DATA:  Date Range: 06/26/2022-08/24/2022  Average Daily Use: 13 hours 34  minutes  Median Use: 14 hours 4 minutes  Compliance for > 4 Hours: 98%  AHI: 0.9 respiratory events per hour  Days Used: 60/60 days  Mask Leak: 21.3  95th Percentile Pressure: 11         LABS: No results found for this or any previous visit (from the past 2160 hour(s)).  Radiology: No results found.  No results found.  No results found.    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   Noncompliance with treatment plan 04/02/2020   High risk medication use 11/02/2019   Bipolar 1 disorder, mixed, moderate (HCC) 09/26/2019   PTSD (post-traumatic stress disorder) 09/26/2019   Attention deficit hyperactivity disorder (ADHD), predominantly inattentive type 09/26/2019   Sleep disorder 09/26/2019   Panic attacks 09/26/2019   Hepatic steatosis 05/26/2019   Vitamin D deficiency 12/05/2018   Gastro-esophageal reflux disease without esophagitis 02/18/2018   IFG (impaired fasting glucose) 05/30/2017   Acne vulgaris 05/28/2017   Chronic nasopharyngitis 03/05/2017   Lumbosacral spondylosis without myelopathy 08/20/2016   Chlamydia infection 07/31/2016   Pap smear of cervix shows low risk HPV present 07/31/2016   Obstructive sleep apnea syndrome, moderate 04/28/2016   Iron deficiency 04/20/2016   Fibromyalgia 03/31/2016   Chronic bilateral low back pain without sciatica 02/21/2016   Family history of Hashimoto thyroiditis 02/21/2016   Morbid obesity with BMI of 50.0-59.9, adult (HCC) 02/21/2016   Scoliosis 02/21/2016   Bipolar 1 disorder, depressed, severe (HCC) 08/19/2015   Anxiety 11/20/2014   Arthropathy of lumbar facet joint 05/24/2013  The patient does tolerate PAP and reports benefit from PAP use. The patient was  reminded how to adjust mask fit and advised to change supplies regularly. The patient was also counselled on nightly use. The compliance is excellent. The AHI is 0.9. Patient continues to require PAP to treat their apnea and is medically necessary.   1. OSA (obstructive sleep apnea) Continue excellent compliance  2. CPAP use counseling CPAP couseling-Discussed importance of adequate CPAP use as well as proper care and cleaning techniques of machine and all supplies.  3. Idiopathic hypersomnia May continue modafinil as before, refills already sent. Pt may call for additional 3 months of refills before 6 month follow up visit  4. Bipolar 1 disorder, depressed, severe (HCC) Followed by psych  5. Anxiety Followed by psych  6. Morbid obesity with BMI of 45.0-49.9, adult (HCC) Obesity Counseling: Had a lengthy discussion regarding patients BMI and weight issues. Patient was instructed on portion control as well as increased activity. Also discussed caloric restrictions with trying to maintain intake less than 2000 Kcal. Discussions were made in accordance with the 5As of weight management. Simple actions such as not eating late and if able to, taking a walk is suggested.    General Counseling: I have discussed the findings of the evaluation and examination with Denise Conrad.  I have also discussed any further diagnostic evaluation thatmay be needed or ordered today. Denise Conrad verbalizes understanding of the findings of todays visit. We also reviewed her medications today and discussed drug interactions and side effects including but not limited excessive drowsiness and altered mental states. We also discussed that there is always a risk not just to her but also people around her. she has been encouraged to call the office with any questions or concerns that should arise related to todays visit.  No orders of the defined types were placed in this encounter.       I have personally obtained a  history, examined the patient, evaluated laboratory and imaging results, formulated the assessment and plan and placed orders.  This patient was seen by Lynn Ito, PA-C in collaboration with Dr. Freda Munro as a part of collaborative care agreement.  Yevonne Pax, MD [facility] Diplomate ABMS Pulmonary Critical Care Medicine and Sleep Medicine

## 2022-08-26 ENCOUNTER — Ambulatory Visit (INDEPENDENT_AMBULATORY_CARE_PROVIDER_SITE_OTHER): Payer: Medicaid Other | Admitting: Internal Medicine

## 2022-08-26 VITALS — BP 112/68 | HR 109 | Resp 18 | Ht 66.0 in | Wt 299.0 lb

## 2022-08-26 DIAGNOSIS — Z7189 Other specified counseling: Secondary | ICD-10-CM | POA: Diagnosis not present

## 2022-08-26 DIAGNOSIS — G4733 Obstructive sleep apnea (adult) (pediatric): Secondary | ICD-10-CM

## 2022-08-26 DIAGNOSIS — Z6841 Body Mass Index (BMI) 40.0 and over, adult: Secondary | ICD-10-CM

## 2022-08-26 DIAGNOSIS — F314 Bipolar disorder, current episode depressed, severe, without psychotic features: Secondary | ICD-10-CM | POA: Diagnosis not present

## 2022-08-26 DIAGNOSIS — F419 Anxiety disorder, unspecified: Secondary | ICD-10-CM

## 2022-08-26 DIAGNOSIS — G4711 Idiopathic hypersomnia with long sleep time: Secondary | ICD-10-CM | POA: Diagnosis not present

## 2022-08-26 NOTE — Patient Instructions (Signed)

## 2022-09-10 ENCOUNTER — Other Ambulatory Visit (HOSPITAL_COMMUNITY): Payer: Self-pay

## 2022-09-14 ENCOUNTER — Telehealth: Payer: Self-pay | Admitting: Psychiatry

## 2022-09-14 MED ORDER — BACLOFEN 10 MG PO TABS: 10.0000 mg | ORAL_TABLET | Freq: Three times a day (TID) | ORAL | 6 refills | Status: DC | PRN

## 2022-09-14 NOTE — Telephone Encounter (Signed)
Pt request refill for baclofen (LIORESAL) 10 MG tablet sent to [id number]

## 2022-09-14 NOTE — Telephone Encounter (Signed)
Refill sent as requested. 

## 2022-10-01 ENCOUNTER — Other Ambulatory Visit: Payer: Self-pay | Admitting: Neurology

## 2022-10-01 ENCOUNTER — Encounter: Payer: Self-pay | Admitting: Adult Health

## 2022-10-01 ENCOUNTER — Other Ambulatory Visit: Payer: Self-pay | Admitting: Anesthesiology

## 2022-10-01 MED ORDER — NURTEC 75 MG PO TBDP: 75.0000 mg | ORAL_TABLET | ORAL | 6 refills | Status: DC | PRN

## 2022-10-09 ENCOUNTER — Other Ambulatory Visit (HOSPITAL_COMMUNITY): Payer: Self-pay

## 2022-10-27 ENCOUNTER — Other Ambulatory Visit: Payer: Self-pay

## 2022-10-27 ENCOUNTER — Other Ambulatory Visit: Payer: Self-pay | Admitting: Psychiatry

## 2022-10-27 ENCOUNTER — Other Ambulatory Visit (HOSPITAL_COMMUNITY): Payer: Self-pay

## 2022-10-27 ENCOUNTER — Other Ambulatory Visit: Payer: Self-pay | Admitting: Pharmacy Technician

## 2022-10-27 MED ORDER — BOTOX 200 UNITS IJ SOLR
200.0000 [IU] | INTRAMUSCULAR | 1 refills | Status: DC
  Filled 2022-10-27: qty 1, 34d supply, fill #0
  Filled 2023-02-01: qty 1, 34d supply, fill #1

## 2022-10-27 NOTE — Telephone Encounter (Signed)
Last seen on 08/13/22 Follow up scheduled on 11/11/22 for botox injection

## 2022-10-27 NOTE — Progress Notes (Signed)
Specialty Pharmacy Refill Coordination Note  Denise Conrad is a 32 y.o. female contacted today regarding refills of specialty medication(s) Onabotulinumtoxina   Patient requested Courier to Provider Office   Delivery date: 11/09/22   Verified address: [facility]  [address]   Medication will be filled on 11/06/22.   Appointment on 11/11/22.   Refill Request sent to MD.

## 2022-10-28 ENCOUNTER — Other Ambulatory Visit: Payer: Self-pay

## 2022-11-06 ENCOUNTER — Other Ambulatory Visit: Payer: Self-pay

## 2022-11-06 ENCOUNTER — Other Ambulatory Visit (HOSPITAL_COMMUNITY): Payer: Self-pay

## 2022-11-09 ENCOUNTER — Other Ambulatory Visit (HOSPITAL_COMMUNITY): Payer: Self-pay

## 2022-11-09 ENCOUNTER — Other Ambulatory Visit: Payer: Self-pay

## 2022-11-09 ENCOUNTER — Ambulatory Visit: Payer: Medicaid Other | Admitting: Adult Health

## 2022-11-09 NOTE — Telephone Encounter (Signed)
Received additional approval from [employer]: [id number] (11/09/22-11/09/23)

## 2022-11-11 ENCOUNTER — Ambulatory Visit: Payer: Medicaid Other | Admitting: Adult Health

## 2022-11-17 ENCOUNTER — Telehealth: Payer: Self-pay | Admitting: Adult Health

## 2022-11-17 NOTE — Telephone Encounter (Signed)
Pt called wanting to schedule a BOTOX appt. Please advise.

## 2022-11-18 NOTE — Telephone Encounter (Signed)
Returned pt's call and got her scheduled with Shanda Bumps for 10/28.

## 2022-11-23 ENCOUNTER — Ambulatory Visit: Payer: Medicaid Other | Admitting: Adult Health

## 2022-11-23 DIAGNOSIS — G43719 Chronic migraine without aura, intractable, without status migrainosus: Secondary | ICD-10-CM

## 2022-11-23 MED ORDER — ONABOTULINUMTOXINA 200 UNITS IJ SOLR
155.0000 [IU] | Freq: Once | INTRAMUSCULAR | Status: AC
Start: 2022-11-23 — End: 2022-11-23
  Administered 2022-11-23: 155 [IU] via INTRAMUSCULAR

## 2022-11-23 NOTE — Progress Notes (Signed)
Botox- 200 units x 1  vial Lot: [id number] Expiration: 12/2024 NDC: 9811-9147-82  Bacteriostatic 0.9% Sodium Chloride- 4mL total NFA:[id number] Expiration: 04/27/23 NDC: 5784-6962-95  Dx:G43.719 SP  Witnessed by: Clemencia Course

## 2022-11-23 NOTE — Progress Notes (Signed)
Update 11/23/2022 JM: Patient returns for repeat Botox.  Prior injection with Denise Millet, Denise Conrad on 08/13/2022. Reports great improvement of migraines, limited to no migraines after injection, can worsen 1-2 weeks prior to next injection. Use of Nurtec with benefit. Use of baclofen as needed. Tolerated procedure well today. Return in 3 months for repeat injection.      Consent Form Botulism Toxin Injection For Chronic Migraine    Reviewed orally with patient, additionally signature is on file:  Botulism toxin has been approved by the Federal drug administration for treatment of chronic migraine. Botulism toxin does not cure chronic migraine and it may not be effective in some patients.  The administration of botulism toxin is accomplished by injecting a small amount of toxin into the muscles of the neck and head. Dosage must be titrated for each individual. Any benefits resulting from botulism toxin tend to wear off after 3 months with a repeat injection required if benefit is to be maintained. Injections are usually done every 3-4 months with maximum effect peak achieved by about 2 or 3 weeks. Botulism toxin is expensive and you should be sure of what costs you will incur resulting from the injection.  The side effects of botulism toxin use for chronic migraine may include:   -Transient, and usually mild, facial weakness with facial injections  -Transient, and usually mild, head or neck weakness with head/neck injections  -Reduction or loss of forehead facial animation due to forehead muscle weakness  -Eyelid drooping  -Dry eye  -Pain at the site of injection or bruising at the site of injection  -Double vision  -Potential unknown long term risks   Contraindications: You should not have Botox if you are pregnant, nursing, allergic to albumin, have an infection, skin condition, or muscle weakness at the site of the injection, or have myasthenia gravis, Lambert-Eaton syndrome, or ALS.  It  is also possible that as with any injection, there may be an allergic reaction or no effect from the medication. Reduced effectiveness after repeated injections is sometimes seen and rarely infection at the injection site may occur. All care will be taken to prevent these side effects. If therapy is given over a long time, atrophy and wasting in the muscle injected may occur. Occasionally the patient's become refractory to treatment because they develop antibodies to the toxin. In this event, therapy needs to be modified.  I have read the above information and consent to the administration of botulism toxin.    BOTOX PROCEDURE NOTE FOR MIGRAINE HEADACHE  Contraindications and precautions discussed with patient(above). Aseptic procedure was observed and patient tolerated procedure. Procedure performed by Ihor Austin, AGNP-BC.   The condition has existed for more than 6 months, and pt does not have a diagnosis of ALS, Myasthenia Gravis or Lambert-Eaton Syndrome.  Risks and benefits of injections discussed and pt agrees to proceed with the procedure.  Written consent obtained  These injections are medically necessary. Pt  receives good benefits from these injections. These injections do not cause sedations or hallucinations which the oral therapies may cause.   Description of procedure:  The patient was placed in a sitting position. The standard protocol was used for Botox as follows, with 5 units of Botox injected at each site:  -Procerus muscle, midline injection  -Corrugator muscle, bilateral injection  -Frontalis muscle, bilateral injection, with 2 sites each side, medial injection was performed in the upper one third of the frontalis muscle, in the region vertical from the  medial inferior edge of the superior orbital rim. The lateral injection was again in the upper one third of the forehead vertically above the lateral limbus of the cornea, 1.5 cm lateral to the medial injection  site.  -Temporalis muscle injection, 4 sites, bilaterally. The first injection was 3 cm above the tragus of the ear, second injection site was 1.5 cm to 3 cm up from the first injection site in line with the tragus of the ear. The third injection site was 1.5-3 cm forward between the first 2 injection sites. The fourth injection site was 1.5 cm posterior to the second injection site. 5th site laterally in the temporalis  muscleat the level of the outer canthus.  -Occipitalis muscle injection, 3 sites, bilaterally. The first injection was done one half way between the occipital protuberance and the tip of the mastoid process behind the ear. The second injection site was done lateral and superior to the first, 1 fingerbreadth from the first injection. The third injection site was 1 fingerbreadth superiorly and medially from the first injection site.  -Cervical paraspinal muscle injection, 2 sites, bilaterally. The first injection site was 1 cm from the midline of the cervical spine, 3 cm inferior to the lower border of the occipital protuberance. The second injection site was 1.5 cm superiorly and laterally to the first injection site.  -Trapezius muscle injection was performed at 3 sites, bilaterally. The first injection site was in the upper trapezius muscle halfway between the inflection point of the neck, and the acromion. The second injection site was one half way between the acromion and the first injection site. The third injection was done between the first injection site and the inflection point of the neck.    A total of 200 units of Botox was prepared, 155 units of Botox was injected as documented above, any Botox not injected was wasted. The patient tolerated the procedure well, there were no complications of the above procedure.   Ihor Austin, AGNP-BC  [facility] [address] [place], [place] [address]  Phone [phone] Fax [phone] Note:  This document was prepared with digital dictation and possible smart phrase technology. Any transcriptional errors that result from this process are unintentional.

## 2022-12-02 ENCOUNTER — Other Ambulatory Visit: Payer: Self-pay

## 2022-12-11 ENCOUNTER — Other Ambulatory Visit: Payer: Self-pay | Admitting: Physician Assistant

## 2022-12-11 DIAGNOSIS — G4711 Idiopathic hypersomnia with long sleep time: Secondary | ICD-10-CM

## 2022-12-29 ENCOUNTER — Other Ambulatory Visit: Payer: Self-pay

## 2023-02-01 ENCOUNTER — Other Ambulatory Visit (HOSPITAL_COMMUNITY): Payer: Self-pay

## 2023-02-01 ENCOUNTER — Other Ambulatory Visit (HOSPITAL_COMMUNITY): Payer: Self-pay | Admitting: Pharmacy Technician

## 2023-02-01 NOTE — Progress Notes (Signed)
 Specialty Pharmacy Refill Coordination Note  Denise Conrad is a 33 y.o. female contacted today regarding refills of specialty medication(s) OnabotulinumtoxinA  (Botox )   Patient requested Courier to Provider Office   Delivery date: 02/09/23   Verified address: [facility] [address]   Medication will be filled on 02/08/23.

## 2023-02-08 ENCOUNTER — Other Ambulatory Visit: Payer: Self-pay

## 2023-02-08 ENCOUNTER — Ambulatory Visit: Payer: Medicaid Other | Admitting: Adult Health

## 2023-02-15 ENCOUNTER — Ambulatory Visit: Payer: Medicaid Other | Admitting: Adult Health

## 2023-02-15 DIAGNOSIS — G43719 Chronic migraine without aura, intractable, without status migrainosus: Secondary | ICD-10-CM

## 2023-02-15 MED ORDER — BACLOFEN 10 MG PO TABS: 10.0000 mg | ORAL_TABLET | Freq: Three times a day (TID) | ORAL | 6 refills | Status: AC | PRN

## 2023-02-15 MED ORDER — NURTEC 75 MG PO TBDP: 75.0000 mg | ORAL_TABLET | ORAL | 6 refills | Status: DC | PRN

## 2023-02-15 MED ORDER — ONABOTULINUMTOXINA 200 UNITS IJ SOLR
155.0000 [IU] | Freq: Once | INTRAMUSCULAR | Status: AC
Start: 2023-02-15 — End: 2023-02-15
  Administered 2023-02-15: 155 [IU] via INTRAMUSCULAR

## 2023-02-15 NOTE — Progress Notes (Signed)
Botox- 200 units x 1 vial Lot: [id number] Expiration: 03/2025 NDC: 1610-9604-54  Bacteriostatic 0.9% Sodium Chloride- 4 mL  Lot: [id number] Expiration: 03/17/2022 NDC: 1478-2956-21  Dx: H08.657  S/P Witnessed by Christophe Louis, CMA

## 2023-02-15 NOTE — Progress Notes (Signed)
Update 11/23/2022 JM: Patient returns for repeat Botox.  Prior injection on 11/23/2022.  Reports migraines remain well-controlled with Botox, can have some mild worsening 1 to 2 weeks prior to next injection.  Prior to the past 1 to 2 weeks, has only had 1 migraine headache.  Use of Nurtec with benefit and use of baclofen as needed.  Tolerated procedure well today. Return in 3 months for repeat injection.      Consent Form Botulism Toxin Injection For Chronic Migraine    Reviewed orally with patient, additionally signature is on file:  Botulism toxin has been approved by the Federal drug administration for treatment of chronic migraine. Botulism toxin does not cure chronic migraine and it may not be effective in some patients.  The administration of botulism toxin is accomplished by injecting a small amount of toxin into the muscles of the neck and head. Dosage must be titrated for each individual. Any benefits resulting from botulism toxin tend to wear off after 3 months with a repeat injection required if benefit is to be maintained. Injections are usually done every 3-4 months with maximum effect peak achieved by about 2 or 3 weeks. Botulism toxin is expensive and you should be sure of what costs you will incur resulting from the injection.  The side effects of botulism toxin use for chronic migraine may include:   -Transient, and usually mild, facial weakness with facial injections  -Transient, and usually mild, head or neck weakness with head/neck injections  -Reduction or loss of forehead facial animation due to forehead muscle weakness  -Eyelid drooping  -Dry eye  -Pain at the site of injection or bruising at the site of injection  -Double vision  -Potential unknown long term risks   Contraindications: You should not have Botox if you are pregnant, nursing, allergic to albumin, have an infection, skin condition, or muscle weakness at the site of the injection, or have  myasthenia gravis, Lambert-Eaton syndrome, or ALS.  It is also possible that as with any injection, there may be an allergic reaction or no effect from the medication. Reduced effectiveness after repeated injections is sometimes seen and rarely infection at the injection site may occur. All care will be taken to prevent these side effects. If therapy is given over a long time, atrophy and wasting in the muscle injected may occur. Occasionally the patient's become refractory to treatment because they develop antibodies to the toxin. In this event, therapy needs to be modified.  I have read the above information and consent to the administration of botulism toxin.    BOTOX PROCEDURE NOTE FOR MIGRAINE HEADACHE  Contraindications and precautions discussed with patient(above). Aseptic procedure was observed and patient tolerated procedure. Procedure performed by Ihor Austin, AGNP-BC.   The condition has existed for more than 6 months, and pt does not have a diagnosis of ALS, Myasthenia Gravis or Lambert-Eaton Syndrome.  Risks and benefits of injections discussed and pt agrees to proceed with the procedure.  Written consent obtained  These injections are medically necessary. Pt  receives good benefits from these injections. These injections do not cause sedations or hallucinations which the oral therapies may cause.   Description of procedure:  The patient was placed in a sitting position. The standard protocol was used for Botox as follows, with 5 units of Botox injected at each site:  -Procerus muscle, midline injection  -Corrugator muscle, bilateral injection  -Frontalis muscle, bilateral injection, with 2 sites each side, medial injection was  performed in the upper one third of the frontalis muscle, in the region vertical from the medial inferior edge of the superior orbital rim. The lateral injection was again in the upper one third of the forehead vertically above the lateral limbus of the  cornea, 1.5 cm lateral to the medial injection site.  -Temporalis muscle injection, 4 sites, bilaterally. The first injection was 3 cm above the tragus of the ear, second injection site was 1.5 cm to 3 cm up from the first injection site in line with the tragus of the ear. The third injection site was 1.5-3 cm forward between the first 2 injection sites. The fourth injection site was 1.5 cm posterior to the second injection site. 5th site laterally in the temporalis  muscleat the level of the outer canthus.  -Occipitalis muscle injection, 3 sites, bilaterally. The first injection was done one half way between the occipital protuberance and the tip of the mastoid process behind the ear. The second injection site was done lateral and superior to the first, 1 fingerbreadth from the first injection. The third injection site was 1 fingerbreadth superiorly and medially from the first injection site.  -Cervical paraspinal muscle injection, 2 sites, bilaterally. The first injection site was 1 cm from the midline of the cervical spine, 3 cm inferior to the lower border of the occipital protuberance. The second injection site was 1.5 cm superiorly and laterally to the first injection site.  -Trapezius muscle injection was performed at 3 sites, bilaterally. The first injection site was in the upper trapezius muscle halfway between the inflection point of the neck, and the acromion. The second injection site was one half way between the acromion and the first injection site. The third injection was done between the first injection site and the inflection point of the neck.    A total of 200 units of Botox was prepared, 155 units of Botox was injected as documented above, any Botox not injected was wasted. The patient tolerated the procedure well, there were no complications of the above procedure.   Ihor Austin, AGNP-BC  [facility] [address] [place], [place]  [address]  Phone [phone] Fax [phone] Note: This document was prepared with digital dictation and possible smart phrase technology. Any transcriptional errors that result from this process are unintentional.

## 2023-02-22 ENCOUNTER — Telehealth: Payer: Self-pay | Admitting: Internal Medicine

## 2023-02-22 NOTE — Telephone Encounter (Signed)
01/26/22-01/26/23 MR uploaded to [facility]

## 2023-02-23 NOTE — Progress Notes (Signed)
[facility] [address] [place], [place] [address]  Pulmonary Sleep Medicine   Office Visit Note  Patient Name: Denise Conrad DOB: 1990-10-16 MRN [id number]    Chief Complaint: Obstructive Sleep Apnea visit  Brief History:  Denise Conrad is seen today for a 6 month follow up visit for CPAP@ 11 cmH2O. The patient has a 6 year history of sleep apnea. Patient is using PAP nightly.  The patient feels rested after sleeping with PAP.  The patient reports benefiting from PAP use. Reported sleepiness is  improved and the Epworth Sleepiness Score is 9 out of 24. The patient will sometimes take naps. The patient complains of the following: patient still complains of fatigue. The compliance download shows 99% compliance with an average use time of 13 hours 55 minutes. The AHI is 0.7.  The patient does have occasional limb movements disrupting sleep, but this is due to pain. The patient continues to require PAP therapy in order to eliminate sleep apnea. Continues to take modafinil 200mg  and is helping with daytime sleepiness. Denies any side effects from this.  ROS  General: (-) fever, (-) chills, (-) night sweat Nose and Sinuses: (-) nasal stuffiness or itchiness, (-) postnasal drip, (-) nosebleeds, (-) sinus trouble. Mouth and Throat: (-) sore throat, (-) hoarseness. Neck: (-) swollen glands, (-) enlarged thyroid, (-) neck pain. Respiratory: - cough, - shortness of breath, - wheezing. Neurologic: + numbness, + tingling. Psychiatric: - anxiety, + depression   Current Medication: Outpatient Encounter Medications as of 02/24/2023  Medication Sig   ARIPiprazole (ABILIFY) 30 MG tablet    baclofen (LIORESAL) 10 MG tablet Take 1 tablet (10 mg total) by mouth 3 (three) times daily as needed for muscle spasms.   BOTOX 200 units injection Inject 200 Units into the muscle every 3 (three) months. Inject 155 units into head and neck muscles for migraine treatment   calcium carbonate (TUMS  - DOSED IN MG ELEMENTAL CALCIUM) 500 MG chewable tablet Chew 1-2 tablets by mouth as needed for indigestion or heartburn.   cetirizine (ZYRTEC) 10 MG tablet Take 10 mg by mouth at bedtime.   clonazePAM (KLONOPIN) 0.5 MG tablet clonazepam 0.5 mg tablet   Cyanocobalamin (B-12) 1000 MCG CAPS Take 1,000 mcg by mouth daily.    fesoterodine (TOVIAZ) 8 MG TB24 tablet Take 8 mg by mouth at bedtime.   levonorgestrel (LILETTA, 52 MG,) 19.5 MCG/DAY IUD IUD 1 each by Intrauterine route once.   lisdexamfetamine (VYVANSE) 60 MG capsule Take 1 capsule (60 mg total) by mouth every morning.   Lurasidone HCl 120 MG TABS Take 1 tablet by mouth every morning.   Melatonin 10 MG TABS Take 20-40 mg by mouth at bedtime as needed (for sleep).    pantoprazole (PROTONIX) 40 MG tablet Take 40 mg by mouth 2 (two) times daily.   pregabalin (LYRICA) 200 MG capsule Take 1 capsule by mouth 2 (two) times daily.   PROVIGIL 200 MG tablet Take 1 tablet (200 mg total) by mouth daily.   Rimegepant Sulfate (NURTEC) 75 MG TBDP Take 1 tablet (75 mg total) by mouth as needed.   tirzepatide (MOUNJARO) 15 MG/0.5ML Pen INJECT 15MG  UNDER THE SKIN ONCE A WEEK   [DISCONTINUED] PROVIGIL 200 MG tablet TAKE 1 TABLET(200 MG) BY MOUTH DAILY   Facility-Administered Encounter Medications as of 02/24/2023  Medication   botulinum toxin Type A (BOTOX) injection 100 Units    Surgical History: Past Surgical History:  Procedure Laterality Date   GINGIVECTOMY  10/2021  IUD INSERTION  05/16/2019       TEMPOROMANDIBULAR JOINT ARTHROPLASTY  2015   TONSILLECTOMY     WISDOM TOOTH EXTRACTION      Medical History: Past Medical History:  Diagnosis Date   Acute renal failure (HCC) 11/22/2014   Acute suppurative tonsillitis 11/20/2014   Anxiety    Bipolar 1 disorder (HCC)    Depression    Fibromyalgia    Hypertensive disorder 08/27/2020   Liver disease    tumor on liver   Sepsis (HCC) 11/20/2014    Family History: Non contributory to the  present illness  Social History: Social History   Socioeconomic History   Marital status: Single    Spouse name: Not on file   Number of children: Not on file   Years of education: Not on file   Highest education level: Not on file  Occupational History   Not on file  Tobacco Use   Smoking status: Never   Smokeless tobacco: Never  Vaping Use   Vaping status: Never Used  Substance and Sexual Activity   Alcohol use: No   Drug use: No   Sexual activity: Yes    Partners: Male  Other Topics Concern   Not on file  Social History Narrative   Not on file   Social Drivers of Health   Financial Resource Strain: Not on file  Food Insecurity: No Food Insecurity (06/12/2021)   Received from [facility]   Hunger Vital Sign    Worried About Running Out of Food in the Last Year: Never true    Ran Out of Food in the Last Year: Never true  Transportation Needs: Not on file  Physical Activity: Not on file  Stress: Not on file  Social Connections: Unknown (05/25/2021)   Received from [facility]   Social Network    Social Network: Not on file  Intimate Partner Violence: Unknown (04/29/2021)   Received from [facility]   HITS    Physically Hurt: Not on file    Insult or Talk Down To: Not on file    Threaten Physical Harm: Not on file    Scream or Curse: Not on file    Vital Signs: Blood pressure 117/81, pulse 93, resp. rate 16, height 5\' 6"  (1.676 m), weight (!) 303 lb (137.4 kg), SpO2 96%. Body mass index is 48.91 kg/m.    Examination: General Appearance: The patient is well-developed, well-nourished, and in no distress. Neck Circumference: 44 cm Skin: Gross inspection of skin unremarkable. Head: normocephalic, no gross deformities. Eyes: no gross deformities noted. ENT: ears appear grossly normal Neurologic: Alert and oriented. No involuntary movements.  STOP BANG RISK ASSESSMENT S (snore) Have you been told that you  snore?     NO   T (tired) Are you often tired, fatigued, or sleepy during the day?   NO  O (obstruction) Do you stop breathing, choke, or gasp during sleep? NO   P (pressure) Do you have or are you being treated for high blood pressure? NO   B (BMI) Is your body index greater than 35 kg/m? YES   A (age) Are you 34 years old or older? NO   N (neck) Do you have a neck circumference greater than 16 inches?   YES   G (gender) Are you a female? NO   TOTAL STOP/BANG "YES" ANSWERS 2       A STOP-Bang score of 2 or less is considered low risk,  and a score of 5 or more is high risk for having either moderate or severe OSA. For people who score 3 or 4, doctors may need to perform further assessment to determine how likely they are to have OSA.         EPWORTH SLEEPINESS SCALE:  Scale:  (0)= no chance of dozing; (1)= slight chance of dozing; (2)= moderate chance of dozing; (3)= high chance of dozing  Chance  Situtation    Sitting and reading: 3    Watching TV: 1    Sitting Inactive in public: 0    As a passenger in car: 2      Lying down to rest: 3    Sitting and talking: 0    Sitting quielty after lunch: 0    In a car, stopped in traffic: 0   TOTAL SCORE:   9 out of 24    SLEEP STUDIES:  PSG (03/2016) AHI 25/hr, min SP02 84% Titration (03/2016) CPAP @ 13 cmH20 MSLT/MWT (11/2018) Mean sleep latency for 4 naps was 12.9 Minutes.  REM noted 0 times PSG (11/2018) AHI 0.6/Hr.  Did not demonstrate OSA   CPAP COMPLIANCE DATA:  Date Range: 08/26/2022-02/21/2023  Average Daily Use: 13 hours 55 minutes  Median Use: 14 hours 10 minutes  Compliance for > 4 Hours: 99%  AHI: 0.7 respiratory events per hour  Days Used: 180/180 days  Mask Leak: 30.5  95th Percentile Pressure: 11         LABS: No results found for this or any previous visit (from the past 2160 hours).  Radiology: No results found.  No results found.  No results found.    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   Noncompliance with treatment plan 04/02/2020   High risk medication use 11/02/2019   Bipolar 1 disorder, mixed, moderate (HCC) 09/26/2019   PTSD (post-traumatic stress disorder) 09/26/2019   Attention deficit hyperactivity disorder (ADHD), predominantly inattentive type 09/26/2019   Sleep disorder 09/26/2019   Panic attacks 09/26/2019   Hepatic steatosis 05/26/2019   Vitamin D deficiency 12/05/2018   Gastro-esophageal reflux disease without esophagitis 02/18/2018   IFG (impaired fasting glucose) 05/30/2017   Acne vulgaris 05/28/2017   Chronic nasopharyngitis 03/05/2017   Lumbosacral spondylosis without myelopathy 08/20/2016   Chlamydia infection 07/31/2016   Pap smear of cervix shows low risk HPV present 07/31/2016   Obstructive sleep apnea syndrome, moderate 04/28/2016   Iron deficiency 04/20/2016   Fibromyalgia 03/31/2016   Chronic bilateral low back pain without sciatica 02/21/2016   Family history of Hashimoto thyroiditis 02/21/2016   Morbid obesity with BMI of 50.0-59.9, adult (HCC) 02/21/2016   Scoliosis 02/21/2016   Bipolar 1 disorder, depressed, severe (HCC) 08/19/2015   Anxiety 11/20/2014   Arthropathy of lumbar facet joint 05/24/2013      The patient does tolerate PAP and reports benefit from PAP use. The patient was reminded how to adjust mask fit and advised to change supplies regularly. The patient was also counselled on nightly use. The compliance is excellent. The AHI is 0.7. Patient continues to require PAP to treat their apnea and is medically necessary.  1. OSA (obstructive sleep apnea) (Primary) Continue excellent compliance  2. CPAP use counseling CPAP couseling-Discussed importance  of adequate CPAP use  as well as proper care and cleaning techniques of machine and all supplies.  3. Idiopathic hypersomnia May continue modafinil as before. Pt aware of S/E to monitor for, especially with other medications. BP and HR stable. - PROVIGIL 200 MG tablet; Take 1 tablet (200 mg total) by mouth daily.  Dispense: 30 tablet; Refill: 4  4. Bipolar 1 disorder, depressed, severe (HCC) Followed by psych  5. Anxiety Followed by psych  6. Morbid obesity with BMI of 45.0-49.9, adult (HCC) Obesity Counseling: Had a lengthy discussion regarding patients BMI and weight issues. Patient was instructed on portion control as well as increased activity. Also discussed caloric restrictions with trying to maintain intake less than 2000 Kcal. Discussions were made in accordance with the 5As of weight management. Simple actions such as not eating late and if able to, taking a walk is suggested.    General Counseling: I have discussed the findings of the evaluation and examination with Denise Conrad.  I have also discussed any further diagnostic evaluation thatmay be needed or ordered today. Denise Conrad verbalizes understanding of the findings of todays visit. We also reviewed her medications today and discussed drug interactions and side effects including but not limited excessive drowsiness and altered mental states. We also discussed that there is always a risk not just to her but also people around her. she has been encouraged to call the office with any questions or concerns that should arise related to todays visit.  No orders of the defined types were placed in this encounter.       I have personally obtained a history, examined the patient, evaluated laboratory and imaging results, formulated the assessment and plan and placed orders.  This patient was seen by Lynn Ito, PA-C in collaboration with Dr. Freda Munro as a part of collaborative care agreement.  Yevonne Pax, MD [facility] Diplomate  [facility] Pulmonary Critical Care Medicine and Sleep Medicine

## 2023-02-24 ENCOUNTER — Ambulatory Visit (INDEPENDENT_AMBULATORY_CARE_PROVIDER_SITE_OTHER): Payer: Medicaid Other | Admitting: Internal Medicine

## 2023-02-24 VITALS — BP 117/81 | HR 93 | Resp 16 | Ht 66.0 in | Wt 303.0 lb

## 2023-02-24 DIAGNOSIS — F314 Bipolar disorder, current episode depressed, severe, without psychotic features: Secondary | ICD-10-CM | POA: Diagnosis not present

## 2023-02-24 DIAGNOSIS — Z7189 Other specified counseling: Secondary | ICD-10-CM

## 2023-02-24 DIAGNOSIS — Z6841 Body Mass Index (BMI) 40.0 and over, adult: Secondary | ICD-10-CM

## 2023-02-24 DIAGNOSIS — G4711 Idiopathic hypersomnia with long sleep time: Secondary | ICD-10-CM

## 2023-02-24 DIAGNOSIS — G4733 Obstructive sleep apnea (adult) (pediatric): Secondary | ICD-10-CM

## 2023-02-24 DIAGNOSIS — F419 Anxiety disorder, unspecified: Secondary | ICD-10-CM

## 2023-02-24 MED ORDER — PROVIGIL 200 MG PO TABS
200.0000 mg | ORAL_TABLET | Freq: Every day | ORAL | 4 refills | Status: DC
Start: 2023-02-22 — End: 2023-09-07

## 2023-02-24 NOTE — Patient Instructions (Signed)

## 2023-03-23 ENCOUNTER — Other Ambulatory Visit (HOSPITAL_COMMUNITY): Payer: Self-pay

## 2023-03-23 ENCOUNTER — Telehealth: Payer: Self-pay

## 2023-03-23 NOTE — Telephone Encounter (Signed)
 Pharmacy Patient Advocate Encounter   Received notification from CoverMyMeds that prior authorization for Nurtec 75MG  dispersible tablets is required/requested.   Insurance verification completed.   The patient is insured through [facility] .   Per test claim: PA required; PA submitted to above mentioned insurance via CoverMyMeds Key/confirmation #/EOC [id number] Status is pending

## 2023-03-24 ENCOUNTER — Telehealth: Payer: Self-pay

## 2023-03-24 NOTE — Telephone Encounter (Signed)
 On the PA it was answered yes she has had more the 15 headache days. Per last office note 02/15/23 when she came for botox it was advised by patient she had 1 migraine and that nurtec was beneficial. Appears this question was answered incorrectly which caused the PA to be denied.

## 2023-03-24 NOTE — Telephone Encounter (Signed)
 Pharmacy Patient Advocate Encounter   Received notification from Pt Calls Messages that prior authorization for Nurtec 75MG  dispersible tablets is required/requested.   Insurance verification completed.   The patient is insured through [facility] .   Per test claim: PA required; PA submitted to above mentioned insurance via CoverMyMeds Key/confirmation #/EOC [id number] Status is pending   **this is a resubmission

## 2023-03-24 NOTE — Telephone Encounter (Signed)
 Pharmacy Patient Advocate Encounter  Received notification from [facility] that Prior Authorization for Nurtec 75MG  dispersible tablets has been DENIED.  Full denial letter will be uploaded to the media tab. See denial reason below.  Per your health plan's criteria, this drug is covered if you meet the following:  You have not had more than 15 headache days per month during the past 6 months.  The information provided does not show that you meet the criteria listed above.  PA #/Case ID/Reference #: [id number]

## 2023-03-24 NOTE — Telephone Encounter (Signed)
 PA request has been  Resubmitted . New Encounter created for follow up. For additional info see Pharmacy Prior Auth telephone encounter from 03-24-2023.

## 2023-03-26 NOTE — Telephone Encounter (Signed)
 Pharmacy Patient Advocate Encounter  Received notification from [facility] that Prior Authorization for Nurtec 75MG  dispersible tablets has been APPROVED from 03-24-2023 to 03-23-2024   PA #/Case ID/Reference #: [id number]

## 2023-04-20 ENCOUNTER — Other Ambulatory Visit: Payer: Self-pay

## 2023-05-13 ENCOUNTER — Ambulatory Visit: Payer: Medicaid Other | Admitting: Adult Health

## 2023-05-25 ENCOUNTER — Other Ambulatory Visit: Payer: Self-pay

## 2023-05-31 ENCOUNTER — Other Ambulatory Visit (HOSPITAL_COMMUNITY): Payer: Self-pay

## 2023-05-31 ENCOUNTER — Other Ambulatory Visit: Payer: Self-pay

## 2023-05-31 ENCOUNTER — Other Ambulatory Visit: Payer: Self-pay | Admitting: Adult Health

## 2023-05-31 NOTE — Progress Notes (Signed)
 Specialty Pharmacy Refill Coordination Note  Denise Conrad is a 33 y.o. female contacted today regarding refills of specialty medication(s) OnabotulinumtoxinA  (Botox )   Patient requested Courier to Provider Office   Delivery date: 06/03/23   Verified address: [facility]   [address]   [place] [address]   Medication will be filled on 06/02/23.   This fill date is pending response to refill request from provider. Patient is aware and if they have not received fill by intended date, they must follow up with pharmacy.

## 2023-06-01 ENCOUNTER — Other Ambulatory Visit: Payer: Self-pay

## 2023-06-01 ENCOUNTER — Other Ambulatory Visit: Payer: Self-pay | Admitting: Adult Health

## 2023-06-01 ENCOUNTER — Other Ambulatory Visit (HOSPITAL_COMMUNITY): Payer: Self-pay

## 2023-06-01 MED ORDER — BOTOX 200 UNITS IJ SOLR
200.0000 [IU] | INTRAMUSCULAR | 1 refills | Status: DC
Start: 2023-06-01 — End: 2023-11-29
  Filled 2023-06-01: qty 1, 34d supply, fill #0
  Filled 2023-08-20: qty 1, 34d supply, fill #1

## 2023-06-02 ENCOUNTER — Other Ambulatory Visit: Payer: Self-pay

## 2023-06-08 ENCOUNTER — Ambulatory Visit: Admitting: Adult Health

## 2023-06-08 DIAGNOSIS — G43719 Chronic migraine without aura, intractable, without status migrainosus: Secondary | ICD-10-CM

## 2023-06-08 MED ORDER — ONABOTULINUMTOXINA 200 UNITS IJ SOLR
155.0000 [IU] | Freq: Once | INTRAMUSCULAR | Status: AC
Start: 2023-06-08 — End: 2023-06-08
  Administered 2023-06-08: 155 [IU] via INTRAMUSCULAR

## 2023-06-08 NOTE — Progress Notes (Signed)
 Update 06/08/2023 JM: Patient returns for repeat Botox .  Prior injection on 02/15/2023.  Reports migraines remain well-controlled with Botox , has had some worsening due to prolonged time in between injections.  Use of Nurtec with benefit and use of baclofen  as needed.  Tolerated procedure well today. Return in 3 months for repeat injection.      Consent Form Botulism Toxin Injection For Chronic Migraine    Reviewed orally with patient, additionally signature is on file:  Botulism toxin has been approved by the Federal drug administration for treatment of chronic migraine. Botulism toxin does not cure chronic migraine and it may not be effective in some patients.  The administration of botulism toxin is accomplished by injecting a small amount of toxin into the muscles of the neck and head. Dosage must be titrated for each individual. Any benefits resulting from botulism toxin tend to wear off after 3 months with a repeat injection required if benefit is to be maintained. Injections are usually done every 3-4 months with maximum effect peak achieved by about 2 or 3 weeks. Botulism toxin is expensive and you should be sure of what costs you will incur resulting from the injection.  The side effects of botulism toxin use for chronic migraine may include:   -Transient, and usually mild, facial weakness with facial injections  -Transient, and usually mild, head or neck weakness with head/neck injections  -Reduction or loss of forehead facial animation due to forehead muscle weakness  -Eyelid drooping  -Dry eye  -Pain at the site of injection or bruising at the site of injection  -Double vision  -Potential unknown long term risks   Contraindications: You should not have Botox  if you are pregnant, nursing, allergic to albumin, have an infection, skin condition, or muscle weakness at the site of the injection, or have myasthenia gravis, Lambert-Eaton syndrome, or ALS.  It is also possible  that as with any injection, there may be an allergic reaction or no effect from the medication. Reduced effectiveness after repeated injections is sometimes seen and rarely infection at the injection site may occur. All care will be taken to prevent these side effects. If therapy is given over a long time, atrophy and wasting in the muscle injected may occur. Occasionally the patient's become refractory to treatment because they develop antibodies to the toxin. In this event, therapy needs to be modified.  I have read the above information and consent to the administration of botulism toxin.    BOTOX  PROCEDURE NOTE FOR MIGRAINE HEADACHE  Contraindications and precautions discussed with patient(above). Aseptic procedure was observed and patient tolerated procedure. Procedure performed by Johny Nap, AGNP-BC.   The condition has existed for more than 6 months, and pt does not have a diagnosis of ALS, Myasthenia Gravis or Lambert-Eaton Syndrome.  Risks and benefits of injections discussed and pt agrees to proceed with the procedure.  Written consent obtained  These injections are medically necessary. Pt  receives good benefits from these injections. These injections do not cause sedations or hallucinations which the oral therapies may cause.   Description of procedure:  The patient was placed in a sitting position. The standard protocol was used for Botox  as follows, with 5 units of Botox  injected at each site:  -Procerus muscle, midline injection  -Corrugator muscle, bilateral injection  -Frontalis muscle, bilateral injection, with 2 sites each side, medial injection was performed in the upper one third of the frontalis muscle, in the region vertical from the medial  inferior edge of the superior orbital rim. The lateral injection was again in the upper one third of the forehead vertically above the lateral limbus of the cornea, 1.5 cm lateral to the medial injection site.  -Temporalis  muscle injection, 4 sites, bilaterally. The first injection was 3 cm above the tragus of the ear, second injection site was 1.5 cm to 3 cm up from the first injection site in line with the tragus of the ear. The third injection site was 1.5-3 cm forward between the first 2 injection sites. The fourth injection site was 1.5 cm posterior to the second injection site. 5th site laterally in the temporalis  muscleat the level of the outer canthus.  -Occipitalis muscle injection, 3 sites, bilaterally. The first injection was done one half way between the occipital protuberance and the tip of the mastoid process behind the ear. The second injection site was done lateral and superior to the first, 1 fingerbreadth from the first injection. The third injection site was 1 fingerbreadth superiorly and medially from the first injection site.  -Cervical paraspinal muscle injection, 2 sites, bilaterally. The first injection site was 1 cm from the midline of the cervical spine, 3 cm inferior to the lower border of the occipital protuberance. The second injection site was 1.5 cm superiorly and laterally to the first injection site.  -Trapezius muscle injection was performed at 3 sites, bilaterally. The first injection site was in the upper trapezius muscle halfway between the inflection point of the neck, and the acromion. The second injection site was one half way between the acromion and the first injection site. The third injection was done between the first injection site and the inflection point of the neck.    A total of 200 units of Botox  was prepared, 155 units of Botox  was injected as documented above, any Botox  not injected was wasted. The patient tolerated the procedure well, there were no complications of the above procedure.   Johny Nap, AGNP-BC  [facility] [address] [place], [place] [address]  Phone [phone] Fax [phone] Note: This document was  prepared with digital dictation and possible smart phrase technology. Any transcriptional errors that result from this process are unintentional.

## 2023-06-08 NOTE — Progress Notes (Signed)
 Botox - 200 units x 1 vial Lot: [id number] Expiration: 10/2025 NDC: 5409-8119-14   Bacteriostatic 0.9% Sodium Chloride - 4 mL  Lot: [id number] Expiration: 12/16/2023 NDC: 2130-8657-84   Dx: O96.295  S/P  Witnessed by: Jerrold Morgan

## 2023-08-11 ENCOUNTER — Other Ambulatory Visit: Payer: Self-pay | Admitting: Physician Assistant

## 2023-08-20 ENCOUNTER — Other Ambulatory Visit: Payer: Self-pay

## 2023-08-20 NOTE — Progress Notes (Signed)
 Specialty Pharmacy Refill Coordination Note  Denise Conrad is a 33 y.o. female contacted today regarding refills of specialty medication(s) OnabotulinumtoxinA  (Botox )   Patient requested Courier to Provider Office   Delivery date: 09/06/23   Verified address: [facility]   [address]   [place] [address]   Medication will be filled on 09/03/23.

## 2023-08-26 ENCOUNTER — Other Ambulatory Visit: Payer: Self-pay

## 2023-09-02 ENCOUNTER — Ambulatory Visit: Admitting: Adult Health

## 2023-09-03 ENCOUNTER — Other Ambulatory Visit: Payer: Self-pay

## 2023-09-06 NOTE — Progress Notes (Signed)
 [facility] [address] [place], [place] [address]  Pulmonary Sleep Medicine   Office Visit Note  Patient Name: Denise Conrad DOB: 11/29/1990 MRN [id number]    Chief Complaint: Obstructive Sleep Apnea visit  Brief History:  Deanndra is seen today for a 6 month follow up on CPAP@11cmh20 .  The patient has a 6.5 year  history of sleep apnea. Patient is using PAP nightly. The patient feels rested after sleeping with PAP.  The patient reports benefiting from PAP use. Reported sleepiness is  improved and the Epworth Sleepiness Score is 10 out of 24. The patient will occasionally take naps. The patient complains of the following: some fatigue.  The compliance download shows  99% compliance with an average use time of 13 hours 48 min. The AHI is 0.7.  The patient has occasional of limb movements disrupting sleep. Patient continues to take provigil  200mg  daily. She denies any side effects with this and reports this helps with daytime sleepiness.  ROS  General: (-) fever, (-) chills, (-) night sweat Nose and Sinuses: (-) nasal stuffiness or itchiness, (-) postnasal drip, (-) nosebleeds, (-) sinus trouble. Mouth and Throat: (-) sore throat, (-) hoarseness. Neck: (-) swollen glands, (-) enlarged thyroid, (-) neck pain. Respiratory: + cough, - shortness of breath, - wheezing. Neurologic: + numbness, + tingling. Psychiatric: + anxiety, - depression   Current Medication: Outpatient Encounter Medications as of 09/07/2023  Medication Sig   ARIPiprazole (ABILIFY) 30 MG tablet    baclofen  (LIORESAL ) 10 MG tablet Take 1 tablet (10 mg total) by mouth 3 (three) times daily as needed for muscle spasms.   BOTOX  200 units injection Inject 200 Units into the muscle every 3 (three) months. Inject 155 units into head and neck muscles for migraine treatment   calcium  carbonate (TUMS - DOSED IN MG ELEMENTAL CALCIUM ) 500 MG chewable tablet Chew 1-2 tablets by mouth as needed for indigestion  or heartburn.   cetirizine (ZYRTEC) 10 MG tablet Take 10 mg by mouth at bedtime.   clonazePAM  (KLONOPIN ) 0.5 MG tablet clonazepam  0.5 mg tablet   Cyanocobalamin (B-12) 1000 MCG CAPS Take 1,000 mcg by mouth daily.    fesoterodine  (TOVIAZ ) 8 MG TB24 tablet Take 8 mg by mouth at bedtime.   levonorgestrel  (LILETTA , 52 MG,) 19.5 MCG/DAY IUD IUD 1 each by Intrauterine route once.   lisdexamfetamine (VYVANSE ) 60 MG capsule Take 1 capsule (60 mg total) by mouth every morning.   Lurasidone  HCl 120 MG TABS Take 1 tablet by mouth every morning.   Melatonin 10 MG TABS Take 20-40 mg by mouth at bedtime as needed (for sleep).    pantoprazole (PROTONIX) 40 MG tablet Take 40 mg by mouth 2 (two) times daily.   pregabalin (LYRICA) 200 MG capsule Take 1 capsule by mouth 2 (two) times daily.   [START ON 09/09/2023] PROVIGIL  200 MG tablet Take 1 tablet (200 mg total) by mouth daily.   QUEtiapine (SEROQUEL) 25 MG tablet Take 25 mg by mouth at bedtime.   Rimegepant Sulfate (NURTEC) 75 MG TBDP Take 1 tablet (75 mg total) by mouth as needed.   tirzepatide (MOUNJARO) 15 MG/0.5ML Pen INJECT 15MG  UNDER THE SKIN ONCE A WEEK   traZODone (DESYREL) 100 MG tablet Take 100 mg by mouth at bedtime.   [DISCONTINUED] PROVIGIL  200 MG tablet Take 1 tablet (200 mg total) by mouth daily.   Facility-Administered Encounter Medications as of 09/07/2023  Medication   botulinum toxin Type A  (BOTOX ) injection 100 Units  Surgical History: Past Surgical History:  Procedure Laterality Date   GINGIVECTOMY  10/2021   IUD INSERTION  05/16/2019       TEMPOROMANDIBULAR JOINT ARTHROPLASTY  2015   TONSILLECTOMY     WISDOM TOOTH EXTRACTION      Medical History: Past Medical History:  Diagnosis Date   Acute renal failure (HCC) 11/22/2014   Acute suppurative tonsillitis 11/20/2014   Anxiety    Bipolar 1 disorder (HCC)    Depression    Fibromyalgia    Hypertensive disorder 08/27/2020   Liver disease    tumor on liver   Sepsis (HCC)  11/20/2014    Family History: Non contributory to the present illness  Social History: Social History   Socioeconomic History   Marital status: Single    Spouse name: Not on file   Number of children: Not on file   Years of education: Not on file   Highest education level: Not on file  Occupational History   Not on file  Tobacco Use   Smoking status: Never   Smokeless tobacco: Never  Vaping Use   Vaping status: Never Used  Substance and Sexual Activity   Alcohol use: No   Drug use: No   Sexual activity: Yes    Partners: Male  Other Topics Concern   Not on file  Social History Narrative   Not on file   Social Drivers of Health   Financial Resource Strain: Medium Risk (07/18/2023)   Received from [facility]   Overall Financial Resource Strain (CARDIA)    Difficulty of Paying Living Expenses: Somewhat hard  Food Insecurity: No Food Insecurity (07/18/2023)   Received from [facility]   Hunger Vital Sign    Within the past 12 months, you worried that your food would run out before you got the money to buy more.: Never true    Within the past 12 months, the food you bought just didn't last and you didn't have money to get more.: Never true  Transportation Needs: No Transportation Needs (07/18/2023)   Received from [facility] - Transportation    In the past 12 months, has lack of transportation kept you from medical appointments or from getting medications?: No    Lack of Transportation (Non-Medical): No  Physical Activity: Not on file  Stress: Not on file  Social Connections: Unknown (05/25/2021)   Received from [facility]   Social Network    Social Network: Not on file  Intimate Partner Violence: Unknown (04/29/2021)   Received from [facility]   HITS    Physically Hurt: Not on file    Insult or Talk Down To: Not on file    Threaten Physical Harm: Not on file    Scream or Curse: Not on file     Vital Signs: Blood pressure 133/88, pulse 94, resp. rate 16, height 5' 6 (1.676 m), weight (!) 305 lb (138.3 kg), SpO2 98%. Body mass index is 49.23 kg/m.    Examination: General Appearance: The patient is well-developed, well-nourished, and in no distress. Neck Circumference: 45 cm Skin: Gross inspection of skin unremarkable. Head: normocephalic, no gross deformities. Eyes: no gross deformities noted. ENT: ears appear grossly normal Neurologic: Alert and oriented. No involuntary movements.  STOP BANG RISK ASSESSMENT S (snore) Have you been told that you snore?     NO   T (tired) Are you often tired, fatigued, or sleepy during the day?   NO  O (obstruction) Do you stop breathing, choke, or gasp during sleep? NO   P (pressure) Do you have or are you being treated for high blood pressure? NO   B (BMI) Is your body index greater than 35 kg/m? YES   A (age) Are you 4 years old or older? NO   N (neck) Do you have a neck circumference greater than 16 inches?   YES   G (gender) Are you a female? NO   TOTAL STOP/BANG "YES" ANSWERS 2       A STOP-Bang score of 2 or less is considered low risk, and a score of 5 or more is high risk for having either moderate or severe OSA. For people who score 3 or 4, doctors may need to perform further assessment to determine how likely they are to have OSA.         EPWORTH SLEEPINESS SCALE:  Scale:  (0)= no chance of dozing; (1)= slight chance of dozing; (2)= moderate chance of dozing; (3)= high chance of dozing  Chance  Situtation    Sitting and reading: 3    Watching TV: 1    Sitting Inactive in public: 0    As a passenger in car: 2      Lying down to rest: 3    Sitting and talking: 0    Sitting quielty after lunch: 1    In a car, stopped in traffic: 0   TOTAL SCORE:   10 out of 24    SLEEP STUDIES:  PSG (03/2016) AHI 25/hr, min SP02 84% Titration (03/2016) CPAP @ 13 cmH20 MSLT/MWT (11/2018) Mean sleep latency for  4 naps was 12.9 Minutes.  REM noted 0 times PSG (11/2018) AHI 0.6/Hr.  Did not demonstrate OSA    CPAP COMPLIANCE DATA:  Date Range: 03/09/2023-09/04/2023  Average Daily Use: 13 hours 48 min  Median Use: 13 hours 59 min  Compliance for > 4 Hours: 99% days  AHI: 0.7 respiratory events per hour  Days Used: 179/180  Mask Leak: 15.8  95th Percentile Pressure: 11cmh20         LABS: No results found for this or any previous visit (from the past 2160 hours).  Radiology: No results found.  No results found.  No results found.    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   Noncompliance with treatment plan 04/02/2020   High risk medication use 11/02/2019   Bipolar 1 disorder, mixed, moderate (HCC) 09/26/2019   PTSD (post-traumatic stress disorder) 09/26/2019   Attention deficit hyperactivity disorder (ADHD), predominantly inattentive type 09/26/2019   Sleep disorder 09/26/2019   Panic attacks 09/26/2019   Hepatic steatosis 05/26/2019   Vitamin D deficiency 12/05/2018   Gastro-esophageal reflux disease without esophagitis 02/18/2018   IFG (impaired fasting glucose) 05/30/2017   Acne vulgaris 05/28/2017   Chronic nasopharyngitis 03/05/2017   Lumbosacral spondylosis without myelopathy 08/20/2016   Chlamydia infection 07/31/2016   Pap smear of cervix shows low risk HPV present 07/31/2016   Obstructive sleep apnea syndrome, moderate 04/28/2016   Iron deficiency 04/20/2016   Fibromyalgia 03/31/2016   Chronic bilateral low back pain without sciatica 02/21/2016   Family history of Hashimoto thyroiditis 02/21/2016   Morbid obesity with BMI of 50.0-59.9, adult (HCC) 02/21/2016   Scoliosis  02/21/2016   Bipolar 1 disorder,  depressed, severe (HCC) 08/19/2015   Anxiety 11/20/2014   Arthropathy of lumbar facet joint 05/24/2013      The patient does tolerate PAP and reports benefit from PAP use. The patient was reminded how to adjust mask fit and advised to change supplies regularly. The patient was also counselled on nightly use. The compliance is excellent. The AHI is 0.7. Patient continues to require PAP to treat their apnea and is medically necessary.  1. OSA (obstructive sleep apnea) (Primary) Continue excellent compliance  2. CPAP use counseling CPAP couseling-Discussed importance of adequate CPAP use as well as proper care and cleaning techniques of machine and all supplies.  3. Idiopathic hypersomnia May continue provigil  as before. Pt aware of S/E to monitor for, especially with other medications. BP and HR stable. - PROVIGIL  200 MG tablet; Take 1 tablet (200 mg total) by mouth daily.  Dispense: 30 tablet; Refill: 5  4. Bipolar 1 disorder, depressed, severe (HCC) Followed by psych  5. Anxiety Followed by psych  6. Morbid obesity with BMI of 45.0-49.9, adult (HCC) Obesity Counseling: Had a lengthy discussion regarding patients BMI and weight issues. Patient was instructed on portion control as well as increased activity. Also discussed caloric restrictions with trying to maintain intake less than 2000 Kcal. Discussions were made in accordance with the 5As of weight management. Simple actions such as not eating late and if able to, taking a walk is suggested.    General Counseling: I have discussed the findings of the evaluation and examination with Akiah.  I have also discussed any further diagnostic evaluation thatmay be needed or ordered today. Raeghan verbalizes understanding of the findings of todays visit. We also reviewed her medications today and discussed drug interactions and side effects including but not limited excessive drowsiness and altered mental states. We also discussed that  there is always a risk not just to her but also people around her. she has been encouraged to call the office with any questions or concerns that should arise related to todays visit.  No orders of the defined types were placed in this encounter.       I have personally obtained a history, examined the patient, evaluated laboratory and imaging results, formulated the assessment and plan and placed orders.  This patient was seen by Tinnie Pro, PA-C in collaboration with Dr. Elfreda Bathe as a part of collaborative care agreement.  Elfreda DELENA Bathe, MD [facility] Diplomate ABMS Pulmonary Critical Care Medicine and Sleep Medicine

## 2023-09-07 ENCOUNTER — Ambulatory Visit (INDEPENDENT_AMBULATORY_CARE_PROVIDER_SITE_OTHER): Admitting: Internal Medicine

## 2023-09-07 VITALS — BP 133/88 | HR 94 | Resp 16 | Ht 66.0 in | Wt 305.0 lb

## 2023-09-07 DIAGNOSIS — F419 Anxiety disorder, unspecified: Secondary | ICD-10-CM

## 2023-09-07 DIAGNOSIS — G4733 Obstructive sleep apnea (adult) (pediatric): Secondary | ICD-10-CM

## 2023-09-07 DIAGNOSIS — F314 Bipolar disorder, current episode depressed, severe, without psychotic features: Secondary | ICD-10-CM | POA: Diagnosis not present

## 2023-09-07 DIAGNOSIS — G4711 Idiopathic hypersomnia with long sleep time: Secondary | ICD-10-CM

## 2023-09-07 DIAGNOSIS — Z7189 Other specified counseling: Secondary | ICD-10-CM | POA: Diagnosis not present

## 2023-09-07 DIAGNOSIS — Z6841 Body Mass Index (BMI) 40.0 and over, adult: Secondary | ICD-10-CM

## 2023-09-07 MED ORDER — PROVIGIL 200 MG PO TABS: 200.0000 mg | ORAL_TABLET | Freq: Every day | ORAL | 5 refills | Status: AC

## 2023-09-07 NOTE — Patient Instructions (Signed)

## 2023-09-09 ENCOUNTER — Other Ambulatory Visit (HOSPITAL_COMMUNITY): Payer: Self-pay

## 2023-09-09 ENCOUNTER — Ambulatory Visit: Admitting: Adult Health

## 2023-09-09 VITALS — BP 133/88 | HR 101

## 2023-09-09 DIAGNOSIS — G43719 Chronic migraine without aura, intractable, without status migrainosus: Secondary | ICD-10-CM

## 2023-09-09 MED ORDER — ONABOTULINUMTOXINA 200 UNITS IJ SOLR
155.0000 [IU] | Freq: Once | INTRAMUSCULAR | Status: AC
  Administered 2023-09-09: 155 [IU] via INTRAMUSCULAR

## 2023-09-09 NOTE — Progress Notes (Signed)
 Update 09/09/2023 JM: Patient returns for repeat Botox .  Prior injection on 06/08/2023.  Reports migraines remain well-controlled with Botox , has not had a migraine headache since prior injection. Had a few mild headaches but resolved without intervention. Use of Nurtec for migraine rescue with benefit. Tolerated procedure well today. Return in 3 months for repeat injection.      Consent Form Botulism Toxin Injection For Chronic Migraine    Reviewed orally with patient, additionally signature is on file:  Botulism toxin has been approved by the Federal drug administration for treatment of chronic migraine. Botulism toxin does not cure chronic migraine and it may not be effective in some patients.  The administration of botulism toxin is accomplished by injecting a small amount of toxin into the muscles of the neck and head. Dosage must be titrated for each individual. Any benefits resulting from botulism toxin tend to wear off after 3 months with a repeat injection required if benefit is to be maintained. Injections are usually done every 3-4 months with maximum effect peak achieved by about 2 or 3 weeks. Botulism toxin is expensive and you should be sure of what costs you will incur resulting from the injection.  The side effects of botulism toxin use for chronic migraine may include:   -Transient, and usually mild, facial weakness with facial injections  -Transient, and usually mild, head or neck weakness with head/neck injections  -Reduction or loss of forehead facial animation due to forehead muscle weakness  -Eyelid drooping  -Dry eye  -Pain at the site of injection or bruising at the site of injection  -Double vision  -Potential unknown long term risks   Contraindications: You should not have Botox  if you are pregnant, nursing, allergic to albumin, have an infection, skin condition, or muscle weakness at the site of the injection, or have myasthenia gravis, Lambert-Eaton  syndrome, or ALS.  It is also possible that as with any injection, there may be an allergic reaction or no effect from the medication. Reduced effectiveness after repeated injections is sometimes seen and rarely infection at the injection site may occur. All care will be taken to prevent these side effects. If therapy is given over a long time, atrophy and wasting in the muscle injected may occur. Occasionally the patient's become refractory to treatment because they develop antibodies to the toxin. In this event, therapy needs to be modified.  I have read the above information and consent to the administration of botulism toxin.    BOTOX  PROCEDURE NOTE FOR MIGRAINE HEADACHE  Contraindications and precautions discussed with patient(above). Aseptic procedure was observed and patient tolerated procedure. Procedure performed by Harlene Bogaert, AGNP-BC.   The condition has existed for more than 6 months, and pt does not have a diagnosis of ALS, Myasthenia Gravis or Lambert-Eaton Syndrome.  Risks and benefits of injections discussed and pt agrees to proceed with the procedure.  Written consent obtained  These injections are medically necessary. Pt  receives good benefits from these injections. These injections do not cause sedations or hallucinations which the oral therapies may cause.   Description of procedure:  The patient was placed in a sitting position. The standard protocol was used for Botox  as follows, with 5 units of Botox  injected at each site:  -Procerus muscle, midline injection  -Corrugator muscle, bilateral injection  -Frontalis muscle, bilateral injection, with 2 sites each side, medial injection was performed in the upper one third of the frontalis muscle, in the region vertical from  the medial inferior edge of the superior orbital rim. The lateral injection was again in the upper one third of the forehead vertically above the lateral limbus of the cornea, 1.5 cm lateral to the  medial injection site.  -Temporalis muscle injection, 4 sites, bilaterally. The first injection was 3 cm above the tragus of the ear, second injection site was 1.5 cm to 3 cm up from the first injection site in line with the tragus of the ear. The third injection site was 1.5-3 cm forward between the first 2 injection sites. The fourth injection site was 1.5 cm posterior to the second injection site. 5th site laterally in the temporalis  muscleat the level of the outer canthus.  -Occipitalis muscle injection, 3 sites, bilaterally. The first injection was done one half way between the occipital protuberance and the tip of the mastoid process behind the ear. The second injection site was done lateral and superior to the first, 1 fingerbreadth from the first injection. The third injection site was 1 fingerbreadth superiorly and medially from the first injection site.  -Cervical paraspinal muscle injection, 2 sites, bilaterally. The first injection site was 1 cm from the midline of the cervical spine, 3 cm inferior to the lower border of the occipital protuberance. The second injection site was 1.5 cm superiorly and laterally to the first injection site.  -Trapezius muscle injection was performed at 3 sites, bilaterally. The first injection site was in the upper trapezius muscle halfway between the inflection point of the neck, and the acromion. The second injection site was one half way between the acromion and the first injection site. The third injection was done between the first injection site and the inflection point of the neck.    A total of 200 units of Botox  was prepared, 155 units of Botox  was injected as documented above, any Botox  not injected was wasted. The patient tolerated the procedure well, there were no complications of the above procedure.   Harlene Bogaert, AGNP-BC  [facility] [address] [place], [place] [address]  Phone [phone] Fax  [phone] Note: This document was prepared with digital dictation and possible smart phrase technology. Any transcriptional errors that result from this process are unintentional.

## 2023-09-09 NOTE — Progress Notes (Signed)
 Botox - 200 units x 1 vial Lot: [id number] Expiration: 11/2025 NDC: 9976-6078-97   Bacteriostatic 0.9% Sodium Chloride - 4 mL  Lot: [id number] Expiration: 11/25/2024 NDC: 9590-8033-97   Dx: H56.280  S/P  Witnessed by: Rojean DEL

## 2023-09-30 ENCOUNTER — Other Ambulatory Visit (HOSPITAL_COMMUNITY): Payer: Self-pay

## 2023-09-30 ENCOUNTER — Other Ambulatory Visit: Payer: Self-pay | Admitting: Adult Health

## 2023-10-04 ENCOUNTER — Other Ambulatory Visit: Payer: Self-pay

## 2023-10-04 MED ORDER — NURTEC 75 MG PO TBDP: 75.0000 mg | ORAL_TABLET | ORAL | 6 refills | Status: AC | PRN

## 2023-11-10 ENCOUNTER — Telehealth: Payer: Self-pay | Admitting: Adult Health

## 2023-11-10 NOTE — Telephone Encounter (Signed)
 Submitted auth renewal request via CMM, status is pending. Key: [id number]

## 2023-11-25 ENCOUNTER — Other Ambulatory Visit: Payer: Self-pay

## 2023-11-25 ENCOUNTER — Other Ambulatory Visit: Payer: Self-pay | Admitting: Adult Health

## 2023-11-29 ENCOUNTER — Other Ambulatory Visit: Payer: Self-pay

## 2023-11-29 ENCOUNTER — Other Ambulatory Visit: Payer: Self-pay | Admitting: Adult Health

## 2023-11-29 MED ORDER — BOTOX 200 UNITS IJ SOLR
200.0000 [IU] | INTRAMUSCULAR | 1 refills | Status: AC
  Filled 2023-11-29: qty 1, 34d supply, fill #0
  Filled 2024-03-02: qty 1, 34d supply, fill #1

## 2023-11-29 NOTE — Telephone Encounter (Signed)
 Noted. We had an incoming Botox  Rx request from [facility]. I have sent that to [facility] NP to approve.

## 2023-11-29 NOTE — Addendum Note (Signed)
 Addended by: WHITFIELD RAISIN L on: 11/29/2023 04:11 PM   Modules accepted: Orders

## 2023-11-29 NOTE — Telephone Encounter (Signed)
 Received approval, she will continue to fill through [facility]. Please send them Botox  refills, thank you!  shara: [id number] (11/10/23-11/09/24)

## 2023-11-29 NOTE — Progress Notes (Signed)
 Specialty Pharmacy Refill Coordination Note  Denise Conrad is a 33 y.o. female contacted today regarding refills of specialty medication(s) OnabotulinumtoxinA  (Botox )   Patient requested Courier to Provider Office   Delivery date: 12/01/23   Verified address: [facility] - [address]   Medication will be filled on: 11/30/23  Copay: $4.00 - patient aware Appointment: 11.10.25

## 2023-12-06 ENCOUNTER — Ambulatory Visit: Admitting: Adult Health

## 2023-12-06 VITALS — BP 136/92 | HR 97 | Ht 66.0 in

## 2023-12-06 DIAGNOSIS — G43719 Chronic migraine without aura, intractable, without status migrainosus: Secondary | ICD-10-CM

## 2023-12-06 MED ORDER — ONABOTULINUMTOXINA 200 UNITS IJ SOLR
155.0000 [IU] | Freq: Once | INTRAMUSCULAR | Status: AC
  Administered 2023-12-06: 155 [IU] via INTRAMUSCULAR

## 2023-12-06 NOTE — Progress Notes (Signed)
 Botox - 200 units x 1 vial Lot: [id number] Expiration: 07/25/25 NDC: 9976-6078-97  Bacteriostatic 0.9% Sodium Chloride - 4 mL  Lot: [id number] Expiration: 11/25/24 NDC: 9590-8033-97  Dx: H56.280 S/P  Witnessed by Stephania CMA

## 2023-12-06 NOTE — Progress Notes (Signed)
 Update 12/01/2023 JM: Patient returns for repeat Botox .  Prior injection on 09/09/2023.  Reports migraines remain well-controlled with Botox . Use of Nurtec for migraine rescue with benefit. Tolerated procedure well today. Return in 3 months for repeat injection.      Consent Form Botulism Toxin Injection For Chronic Migraine    Reviewed orally with patient, additionally signature is on file:  Botulism toxin has been approved by the Federal drug administration for treatment of chronic migraine. Botulism toxin does not cure chronic migraine and it may not be effective in some patients.  The administration of botulism toxin is accomplished by injecting a small amount of toxin into the muscles of the neck and head. Dosage must be titrated for each individual. Any benefits resulting from botulism toxin tend to wear off after 3 months with a repeat injection required if benefit is to be maintained. Injections are usually done every 3-4 months with maximum effect peak achieved by about 2 or 3 weeks. Botulism toxin is expensive and you should be sure of what costs you will incur resulting from the injection.  The side effects of botulism toxin use for chronic migraine may include:   -Transient, and usually mild, facial weakness with facial injections  -Transient, and usually mild, head or neck weakness with head/neck injections  -Reduction or loss of forehead facial animation due to forehead muscle weakness  -Eyelid drooping  -Dry eye  -Pain at the site of injection or bruising at the site of injection  -Double vision  -Potential unknown long term risks   Contraindications: You should not have Botox  if you are pregnant, nursing, allergic to albumin, have an infection, skin condition, or muscle weakness at the site of the injection, or have myasthenia gravis, Lambert-Eaton syndrome, or ALS.  It is also possible that as with any injection, there may be an allergic reaction or no effect from  the medication. Reduced effectiveness after repeated injections is sometimes seen and rarely infection at the injection site may occur. All care will be taken to prevent these side effects. If therapy is given over a long time, atrophy and wasting in the muscle injected may occur. Occasionally the patient's become refractory to treatment because they develop antibodies to the toxin. In this event, therapy needs to be modified.  I have read the above information and consent to the administration of botulism toxin.    BOTOX  PROCEDURE NOTE FOR MIGRAINE HEADACHE  Contraindications and precautions discussed with patient(above). Aseptic procedure was observed and patient tolerated procedure. Procedure performed by Harlene Bogaert, AGNP-BC.   The condition has existed for more than 6 months, and pt does not have a diagnosis of ALS, Myasthenia Gravis or Lambert-Eaton Syndrome.  Risks and benefits of injections discussed and pt agrees to proceed with the procedure.  Written consent obtained  These injections are medically necessary. Pt  receives good benefits from these injections. These injections do not cause sedations or hallucinations which the oral therapies may cause.   Description of procedure:  The patient was placed in a sitting position. The standard protocol was used for Botox  as follows, with 5 units of Botox  injected at each site:  -Procerus muscle, midline injection  -Corrugator muscle, bilateral injection  -Frontalis muscle, bilateral injection, with 2 sites each side, medial injection was performed in the upper one third of the frontalis muscle, in the region vertical from the medial inferior edge of the superior orbital rim. The lateral injection was again in the upper one  third of the forehead vertically above the lateral limbus of the cornea, 1.5 cm lateral to the medial injection site.  -Temporalis muscle injection, 4 sites, bilaterally. The first injection was 3 cm above the tragus  of the ear, second injection site was 1.5 cm to 3 cm up from the first injection site in line with the tragus of the ear. The third injection site was 1.5-3 cm forward between the first 2 injection sites. The fourth injection site was 1.5 cm posterior to the second injection site. 5th site laterally in the temporalis  muscleat the level of the outer canthus.  -Occipitalis muscle injection, 3 sites, bilaterally. The first injection was done one half way between the occipital protuberance and the tip of the mastoid process behind the ear. The second injection site was done lateral and superior to the first, 1 fingerbreadth from the first injection. The third injection site was 1 fingerbreadth superiorly and medially from the first injection site.  -Cervical paraspinal muscle injection, 2 sites, bilaterally. The first injection site was 1 cm from the midline of the cervical spine, 3 cm inferior to the lower border of the occipital protuberance. The second injection site was 1.5 cm superiorly and laterally to the first injection site.  -Trapezius muscle injection was performed at 3 sites, bilaterally. The first injection site was in the upper trapezius muscle halfway between the inflection point of the neck, and the acromion. The second injection site was one half way between the acromion and the first injection site. The third injection was done between the first injection site and the inflection point of the neck.    A total of 200 units of Botox  was prepared, 155 units of Botox  was injected as documented above, any Botox  not injected was wasted. The patient tolerated the procedure well, there were no complications of the above procedure.   Harlene Bogaert, AGNP-BC  [facility] [address] [place], [place] [address]  Phone [phone] Fax [phone] Note: This document was prepared with digital dictation and possible smart phrase technology. Any transcriptional  errors that result from this process are unintentional.

## 2023-12-27 ENCOUNTER — Other Ambulatory Visit: Payer: Self-pay

## 2024-02-24 ENCOUNTER — Telehealth: Payer: Self-pay | Admitting: Pharmacist

## 2024-02-24 ENCOUNTER — Other Ambulatory Visit: Payer: Self-pay

## 2024-02-24 NOTE — Telephone Encounter (Signed)
 Pharmacy Patient Advocate Encounter  Received notification from [facility] MEDICAID that Prior Authorization for Rimegepant Sulfate (NURTEC) 75 MG TBDP has been APPROVED from 02/24/2024 to 02/24/2024   PA #/Case ID/Reference #: [id number]

## 2024-02-24 NOTE — Telephone Encounter (Signed)
 Pharmacy Patient Advocate Encounter   Received notification from CoverMyMeds that prior authorization for Nurtec 75mg  is required/requested.   Insurance verification completed.   The patient is insured through [facility] MEDICAID.   Per test claim: PA required; PA submitted to above mentioned insurance via Latent Key/confirmation #/EOC [id number] Status is pending

## 2024-03-02 ENCOUNTER — Ambulatory Visit: Admitting: Adult Health

## 2024-03-02 ENCOUNTER — Other Ambulatory Visit: Payer: Self-pay

## 2024-03-02 NOTE — Progress Notes (Signed)
 Specialty Pharmacy Refill Coordination Note  Denise Conrad is a 34 y.o. female assessed today regarding refills of clinic administered specialty medication(s) OnabotulinumtoxinA  (Botox )   Clinic requested Courier to Provider Office   Delivery date: 03/07/24   Verified address: [facility] [address] [place],  [place] [address]   Medication will be filled on: 03/06/24

## 2024-03-13 ENCOUNTER — Ambulatory Visit: Admitting: Adult Health
